# Patient Record
Sex: Male | Born: 1950
Health system: Southern US, Community
[De-identification: ages and names within clinical notes are randomized; demographics above are authoritative.]

## PROBLEM LIST (undated history)

## (undated) HISTORY — PX: OTHER SURGICAL HISTORY: SHX169

---

## 2011-04-14 ENCOUNTER — Encounter (HOSPITAL_COMMUNITY): Payer: Self-pay | Admitting: Anesthesiology

## 2011-04-14 ENCOUNTER — Emergency Department (HOSPITAL_COMMUNITY): Payer: BC Managed Care – PPO

## 2011-04-14 ENCOUNTER — Encounter (HOSPITAL_COMMUNITY): Admission: EM | Disposition: A | Discharge status: Rehabilitation Facility | Payer: Self-pay | Source: Ambulatory Visit

## 2011-04-14 ENCOUNTER — Emergency Department (HOSPITAL_COMMUNITY): Payer: BC Managed Care – PPO | Admitting: Anesthesiology

## 2011-04-14 ENCOUNTER — Inpatient Hospital Stay (HOSPITAL_COMMUNITY)
Admission: EM | Admit: 2011-04-14 | Discharge: 2011-04-27 | DRG: 755 | Disposition: A | Discharge status: Rehabilitation Facility | Payer: BC Managed Care – PPO | Source: Ambulatory Visit | Attending: General Surgery | Admitting: General Surgery

## 2011-04-14 ENCOUNTER — Encounter: Payer: Self-pay | Admitting: Emergency Medicine

## 2011-04-14 DIAGNOSIS — D62 Acute posthemorrhagic anemia: Secondary | ICD-10-CM | POA: Diagnosis not present

## 2011-04-14 DIAGNOSIS — S32009A Unspecified fracture of unspecified lumbar vertebra, initial encounter for closed fracture: Principal | ICD-10-CM | POA: Diagnosis present

## 2011-04-14 DIAGNOSIS — W11XXXA Fall on and from ladder, initial encounter: Secondary | ICD-10-CM | POA: Diagnosis present

## 2011-04-14 DIAGNOSIS — S82899A Other fracture of unspecified lower leg, initial encounter for closed fracture: Secondary | ICD-10-CM

## 2011-04-14 DIAGNOSIS — R209 Unspecified disturbances of skin sensation: Secondary | ICD-10-CM | POA: Diagnosis not present

## 2011-04-14 DIAGNOSIS — K5900 Constipation, unspecified: Secondary | ICD-10-CM | POA: Diagnosis not present

## 2011-04-14 DIAGNOSIS — R339 Retention of urine, unspecified: Secondary | ICD-10-CM | POA: Diagnosis not present

## 2011-04-14 DIAGNOSIS — S82309A Unspecified fracture of lower end of unspecified tibia, initial encounter for closed fracture: Secondary | ICD-10-CM | POA: Diagnosis present

## 2011-04-14 DIAGNOSIS — S61209A Unspecified open wound of unspecified finger without damage to nail, initial encounter: Secondary | ICD-10-CM

## 2011-04-14 DIAGNOSIS — S82402A Unspecified fracture of shaft of left fibula, initial encounter for closed fracture: Secondary | ICD-10-CM | POA: Diagnosis present

## 2011-04-14 DIAGNOSIS — IMO0001 Reserved for inherently not codable concepts without codable children: Secondary | ICD-10-CM | POA: Diagnosis present

## 2011-04-14 DIAGNOSIS — S32012A Unstable burst fracture of first lumbar vertebra, initial encounter for closed fracture: Secondary | ICD-10-CM | POA: Diagnosis present

## 2011-04-14 DIAGNOSIS — S61219A Laceration without foreign body of unspecified finger without damage to nail, initial encounter: Secondary | ICD-10-CM

## 2011-04-14 DIAGNOSIS — S82209A Unspecified fracture of shaft of unspecified tibia, initial encounter for closed fracture: Secondary | ICD-10-CM | POA: Diagnosis present

## 2011-04-14 DIAGNOSIS — R338 Other retention of urine: Secondary | ICD-10-CM | POA: Diagnosis not present

## 2011-04-14 DIAGNOSIS — S42409A Unspecified fracture of lower end of unspecified humerus, initial encounter for closed fracture: Secondary | ICD-10-CM | POA: Diagnosis present

## 2011-04-14 DIAGNOSIS — S82409A Unspecified fracture of shaft of unspecified fibula, initial encounter for closed fracture: Secondary | ICD-10-CM

## 2011-04-14 HISTORY — PX: I & D EXTREMITY: SHX5045

## 2011-04-14 HISTORY — PX: EXTERNAL FIXATION LEG: SHX1549

## 2011-04-14 LAB — COMPREHENSIVE METABOLIC PANEL
AST: 46 U/L — ABNORMAL HIGH (ref 0–37)
Albumin: 3.5 g/dL (ref 3.5–5.2)
BUN: 15 mg/dL (ref 6–23)
Chloride: 104 mEq/L (ref 96–112)
Creatinine, Ser: 1.12 mg/dL (ref 0.50–1.35)
Total Bilirubin: 0.9 mg/dL (ref 0.3–1.2)
Total Protein: 6.3 g/dL (ref 6.0–8.3)

## 2011-04-14 LAB — TYPE AND SCREEN
ABO/RH(D): O POS
Antibody Screen: NEGATIVE

## 2011-04-14 LAB — DIFFERENTIAL
Basophils Absolute: 0 10*3/uL (ref 0.0–0.1)
Basophils Relative: 0 % (ref 0–1)
Eosinophils Absolute: 0.1 10*3/uL (ref 0.0–0.7)
Monocytes Absolute: 0.7 10*3/uL (ref 0.1–1.0)
Monocytes Relative: 6 % (ref 3–12)
Neutro Abs: 10.1 10*3/uL — ABNORMAL HIGH (ref 1.7–7.7)
Neutrophils Relative %: 86 % — ABNORMAL HIGH (ref 43–77)

## 2011-04-14 LAB — CBC
Hemoglobin: 15.3 g/dL (ref 13.0–17.0)
MCH: 31.9 pg (ref 26.0–34.0)
MCHC: 34.6 g/dL (ref 30.0–36.0)
RDW: 13.3 % (ref 11.5–15.5)

## 2011-04-14 LAB — ABO/RH: ABO/RH(D): O POS

## 2011-04-14 SURGERY — EXTERNAL FIXATION, LOWER EXTREMITY
Anesthesia: General | Site: Leg Lower | Laterality: Left

## 2011-04-14 MED ORDER — ACETAMINOPHEN 325 MG PO TABS
650.0000 mg | ORAL_TABLET | ORAL | Status: DC | PRN
  Administered 2011-04-20 (×2): 650 mg via ORAL
  Filled 2011-04-14 (×2): qty 2

## 2011-04-14 MED ORDER — LACTATED RINGERS IV SOLN
INTRAVENOUS | Status: DC | PRN
  Administered 2011-04-14 (×5): via INTRAVENOUS

## 2011-04-14 MED ORDER — SODIUM CHLORIDE 0.9 % IJ SOLN
3.0000 mL | Freq: Two times a day (BID) | INTRAMUSCULAR | Status: DC
  Administered 2011-04-14: 3 mL via INTRAVENOUS

## 2011-04-14 MED ORDER — BUPIVACAINE HCL (PF) 0.5 % IJ SOLN
INTRAMUSCULAR | Status: DC | PRN
  Administered 2011-04-14: 5 mL

## 2011-04-14 MED ORDER — SODIUM CHLORIDE 0.9 % IR SOLN
Status: DC | PRN
  Administered 2011-04-14: 1000 mL

## 2011-04-14 MED ORDER — MENTHOL 3 MG MT LOZG
1.0000 | LOZENGE | OROMUCOSAL | Status: DC | PRN
  Filled 2011-04-14: qty 9

## 2011-04-14 MED ORDER — DIPHENHYDRAMINE HCL 12.5 MG/5ML PO ELIX
12.5000 mg | ORAL_SOLUTION | Freq: Four times a day (QID) | ORAL | Status: DC | PRN
  Filled 2011-04-14: qty 5

## 2011-04-14 MED ORDER — FENTANYL CITRATE 0.05 MG/ML IJ SOLN
INTRAMUSCULAR | Status: DC | PRN
  Administered 2011-04-14 (×2): 50 ug via INTRAVENOUS
  Administered 2011-04-14: 100 ug via INTRAVENOUS
  Administered 2011-04-14: 50 ug via INTRAVENOUS
  Administered 2011-04-14: 150 ug via INTRAVENOUS
  Administered 2011-04-14: 200 ug via INTRAVENOUS
  Administered 2011-04-14: 100 ug via INTRAVENOUS
  Administered 2011-04-14: 50 ug via INTRAVENOUS
  Administered 2011-04-14: 150 ug via INTRAVENOUS
  Administered 2011-04-14 (×3): 100 ug via INTRAVENOUS

## 2011-04-14 MED ORDER — DIPHENHYDRAMINE HCL 50 MG/ML IJ SOLN: 12.5000 mg | Freq: Four times a day (QID) | INTRAMUSCULAR | Status: DC | PRN

## 2011-04-14 MED ORDER — SODIUM CHLORIDE 0.9 % IV SOLN
INTRAVENOUS | Status: DC | PRN
  Administered 2011-04-14: 21:00:00 via INTRAVENOUS

## 2011-04-14 MED ORDER — MORPHINE SULFATE (PF) 1 MG/ML IV SOLN
INTRAVENOUS | Status: DC
  Administered 2011-04-15 (×2): via INTRAVENOUS
  Administered 2011-04-15: 4.27 mg via INTRAVENOUS
  Administered 2011-04-15: 18 mg via INTRAVENOUS
  Filled 2011-04-14 (×2): qty 25

## 2011-04-14 MED ORDER — HYDROMORPHONE HCL PF 1 MG/ML IJ SOLN
1.0000 mg | INTRAMUSCULAR | Status: DC | PRN
  Administered 2011-04-15 – 2011-04-16 (×4): 1 mg via INTRAVENOUS
  Administered 2011-04-16: 2 mg via INTRAVENOUS
  Administered 2011-04-16: 1 mg via INTRAVENOUS
  Administered 2011-04-16: 2 mg via INTRAVENOUS
  Administered 2011-04-16: 1 mg via INTRAVENOUS
  Administered 2011-04-16: 2 mg via INTRAVENOUS
  Administered 2011-04-17: 1 mg via INTRAVENOUS
  Administered 2011-04-17: 2 mg via INTRAVENOUS
  Administered 2011-04-17 (×4): 1 mg via INTRAVENOUS
  Administered 2011-04-17: 2 mg via INTRAVENOUS
  Administered 2011-04-18 – 2011-04-24 (×16): 1 mg via INTRAVENOUS
  Administered 2011-04-24 – 2011-04-25 (×3): 2 mg via INTRAVENOUS
  Administered 2011-04-26: 1 mg via INTRAVENOUS
  Administered 2011-04-26: 2 mg via INTRAVENOUS
  Filled 2011-04-14: qty 1
  Filled 2011-04-14: qty 2
  Filled 2011-04-14: qty 1
  Filled 2011-04-14: qty 2
  Filled 2011-04-14 (×2): qty 1
  Filled 2011-04-14 (×2): qty 2
  Filled 2011-04-14 (×3): qty 1
  Filled 2011-04-14: qty 2
  Filled 2011-04-14 (×2): qty 1
  Filled 2011-04-14 (×2): qty 2
  Filled 2011-04-14 (×6): qty 1
  Filled 2011-04-14: qty 2
  Filled 2011-04-14: qty 1
  Filled 2011-04-14: qty 2
  Filled 2011-04-14 (×2): qty 1
  Filled 2011-04-14 (×2): qty 2
  Filled 2011-04-14 (×2): qty 1
  Filled 2011-04-14 (×2): qty 2
  Filled 2011-04-14 (×3): qty 1
  Filled 2011-04-14: qty 2
  Filled 2011-04-14 (×2): qty 1

## 2011-04-14 MED ORDER — HYDROMORPHONE HCL PF 1 MG/ML IJ SOLN
INTRAMUSCULAR | Status: AC
  Administered 2011-04-14: 1 mg via INTRAVENOUS
  Filled 2011-04-14: qty 1

## 2011-04-14 MED ORDER — SODIUM CHLORIDE 0.9 % IR SOLN
Status: DC | PRN
  Administered 2011-04-14: 20:00:00

## 2011-04-14 MED ORDER — HYDROMORPHONE HCL PF 1 MG/ML IJ SOLN: 1.0000 mg | Freq: Once | INTRAMUSCULAR | Status: DC

## 2011-04-14 MED ORDER — MEPERIDINE HCL 25 MG/ML IJ SOLN: 6.2500 mg | INTRAMUSCULAR | Status: DC | PRN

## 2011-04-14 MED ORDER — NEOSTIGMINE METHYLSULFATE 1 MG/ML IJ SOLN
INTRAMUSCULAR | Status: DC | PRN
  Administered 2011-04-14: 4.5 mg via INTRAVENOUS

## 2011-04-14 MED ORDER — ONDANSETRON HCL 4 MG/2ML IJ SOLN: 4.0000 mg | Freq: Once | INTRAMUSCULAR | Status: DC | PRN

## 2011-04-14 MED ORDER — POTASSIUM CHLORIDE IN NACL 20-0.9 MEQ/L-% IV SOLN
INTRAVENOUS | Status: DC
  Administered 2011-04-14: via INTRAVENOUS
  Filled 2011-04-14 (×3): qty 1000

## 2011-04-14 MED ORDER — CEFAZOLIN SODIUM 1-5 GM-% IV SOLN
INTRAVENOUS | Status: DC | PRN
  Administered 2011-04-14: 2 g via INTRAVENOUS

## 2011-04-14 MED ORDER — HYDROMORPHONE HCL PF 1 MG/ML IJ SOLN
0.2500 mg | INTRAMUSCULAR | Status: DC | PRN
  Administered 2011-04-14: 0.5 mg via INTRAVENOUS
  Administered 2011-04-14: 1 mg via INTRAVENOUS
  Administered 2011-04-14: 0.5 mg via INTRAVENOUS

## 2011-04-14 MED ORDER — TETANUS-DIPHTH-ACELL PERTUSSIS 5-2.5-18.5 LF-MCG/0.5 IM SUSP
INTRAMUSCULAR | Status: AC
  Administered 2011-04-14: 0.5 mL via INTRAMUSCULAR
  Filled 2011-04-14: qty 0.5

## 2011-04-14 MED ORDER — NALOXONE HCL 0.4 MG/ML IJ SOLN
0.4000 mg | INTRAMUSCULAR | Status: DC | PRN
  Filled 2011-04-14: qty 1

## 2011-04-14 MED ORDER — ONDANSETRON HCL 4 MG PO TABS: 4.0000 mg | ORAL_TABLET | Freq: Four times a day (QID) | ORAL | Status: DC | PRN

## 2011-04-14 MED ORDER — ALUM & MAG HYDROXIDE-SIMETH 400-400-40 MG/5ML PO SUSP
30.0000 mL | Freq: Four times a day (QID) | ORAL | Status: DC | PRN
  Filled 2011-04-14: qty 30

## 2011-04-14 MED ORDER — ONDANSETRON HCL 4 MG/2ML IJ SOLN
4.0000 mg | INTRAMUSCULAR | Status: DC | PRN
  Administered 2011-04-15: 4 mg via INTRAVENOUS

## 2011-04-14 MED ORDER — ROCURONIUM BROMIDE 100 MG/10ML IV SOLN
INTRAVENOUS | Status: DC | PRN
  Administered 2011-04-14: 50 mg via INTRAVENOUS
  Administered 2011-04-14: 10 mg via INTRAVENOUS
  Administered 2011-04-14: 20 mg via INTRAVENOUS
  Administered 2011-04-14 (×3): 10 mg via INTRAVENOUS
  Administered 2011-04-14 (×2): 20 mg via INTRAVENOUS
  Administered 2011-04-14: 10 mg via INTRAVENOUS

## 2011-04-14 MED ORDER — MIDAZOLAM HCL 5 MG/5ML IJ SOLN
INTRAMUSCULAR | Status: DC | PRN
  Administered 2011-04-14: 2 mg via INTRAVENOUS

## 2011-04-14 MED ORDER — PROPOFOL 10 MG/ML IV EMUL
INTRAVENOUS | Status: DC | PRN
  Administered 2011-04-14: 150 mg via INTRAVENOUS

## 2011-04-14 MED ORDER — TETANUS-DIPHTH-ACELL PERTUSSIS 5-2.5-18.5 LF-MCG/0.5 IM SUSP: 0.5000 mL | Freq: Once | INTRAMUSCULAR | Status: DC

## 2011-04-14 MED ORDER — SUCCINYLCHOLINE CHLORIDE 20 MG/ML IJ SOLN
INTRAMUSCULAR | Status: DC | PRN
  Administered 2011-04-14: 100 mg via INTRAVENOUS

## 2011-04-14 MED ORDER — SODIUM CHLORIDE 0.9 % IJ SOLN: 3.0000 mL | INTRAMUSCULAR | Status: DC | PRN

## 2011-04-14 MED ORDER — LIDOCAINE-EPINEPHRINE 1 %-1:100000 IJ SOLN
INTRAMUSCULAR | Status: DC | PRN
  Administered 2011-04-14: 5 mL

## 2011-04-14 MED ORDER — HYDROMORPHONE HCL PF 1 MG/ML IJ SOLN
1.0000 mg | Freq: Once | INTRAMUSCULAR | Status: AC
  Administered 2011-04-14: 1 mg via INTRAVENOUS
  Filled 2011-04-14: qty 1

## 2011-04-14 MED ORDER — ACETAMINOPHEN 650 MG RE SUPP: 650.0000 mg | RECTAL | Status: DC | PRN

## 2011-04-14 MED ORDER — ONDANSETRON HCL 4 MG/2ML IJ SOLN
4.0000 mg | Freq: Four times a day (QID) | INTRAMUSCULAR | Status: DC | PRN
  Filled 2011-04-14: qty 2

## 2011-04-14 MED ORDER — SODIUM CHLORIDE 0.9 % IV SOLN: 250.0000 mL | INTRAVENOUS | Status: DC

## 2011-04-14 MED ORDER — GLYCOPYRROLATE 0.2 MG/ML IJ SOLN
INTRAMUSCULAR | Status: DC | PRN
  Administered 2011-04-14: .8 mg via INTRAVENOUS

## 2011-04-14 MED ORDER — CEFAZOLIN SODIUM 1-5 GM-% IV SOLN
1.0000 g | Freq: Three times a day (TID) | INTRAVENOUS | Status: AC
  Administered 2011-04-15 (×2): 1 g via INTRAVENOUS
  Filled 2011-04-14 (×2): qty 50

## 2011-04-14 MED ORDER — ONDANSETRON HCL 4 MG/2ML IJ SOLN
INTRAMUSCULAR | Status: DC | PRN
  Administered 2011-04-14: 4 mg via INTRAVENOUS

## 2011-04-14 MED ORDER — DOCUSATE SODIUM 100 MG PO CAPS
100.0000 mg | ORAL_CAPSULE | Freq: Two times a day (BID) | ORAL | Status: DC
  Administered 2011-04-15 – 2011-04-27 (×25): 100 mg via ORAL
  Filled 2011-04-14 (×23): qty 1

## 2011-04-14 MED ORDER — PHENOL 1.4 % MT LIQD: 1.0000 | OROMUCOSAL | Status: DC | PRN

## 2011-04-14 MED ORDER — PANTOPRAZOLE SODIUM 40 MG IV SOLR
40.0000 mg | Freq: Every day | INTRAVENOUS | Status: DC
  Administered 2011-04-15: 40 mg via INTRAVENOUS
  Filled 2011-04-14 (×11): qty 40

## 2011-04-14 MED ORDER — THROMBIN 20000 UNITS EX KIT
PACK | CUTANEOUS | Status: DC | PRN
  Administered 2011-04-14: 20:00:00 via TOPICAL

## 2011-04-14 MED ORDER — PANTOPRAZOLE SODIUM 40 MG PO TBEC
40.0000 mg | DELAYED_RELEASE_TABLET | Freq: Every day | ORAL | Status: DC
Start: 2011-04-14 — End: 2011-04-27
  Administered 2011-04-15 – 2011-04-27 (×13): 40 mg via ORAL
  Filled 2011-04-14 (×12): qty 1

## 2011-04-14 MED ORDER — ONDANSETRON HCL 4 MG/2ML IJ SOLN: 4.0000 mg | Freq: Four times a day (QID) | INTRAMUSCULAR | Status: DC | PRN

## 2011-04-14 MED ORDER — SODIUM CHLORIDE 0.9 % IJ SOLN: 9.0000 mL | INTRAMUSCULAR | Status: DC | PRN

## 2011-04-14 MED ORDER — KCL-LACTATED RINGERS-D5W 20 MEQ/L IV SOLN
INTRAVENOUS | Status: DC
  Administered 2011-04-15 – 2011-04-19 (×5): via INTRAVENOUS
  Filled 2011-04-14 (×8): qty 1000

## 2011-04-14 SURGICAL SUPPLY — 98 items
3.2MM DRILL BIT ×1 IMPLANT
ADH SKN CLS APL DERMABOND .7 (GAUZE/BANDAGES/DRESSINGS) ×3
BAG DECANTER FOR FLEXI CONT (MISCELLANEOUS) ×1 IMPLANT
BANDAGE COBAN STERILE 4 (GAUZE/BANDAGES/DRESSINGS) ×1 IMPLANT
BANDAGE ELASTIC 6 VELCRO ST LF (GAUZE/BANDAGES/DRESSINGS) ×1 IMPLANT
BLADE SURG 10 STRL SS (BLADE) ×3 IMPLANT
BLADE SURG 15 STRL LF DISP TIS (BLADE) IMPLANT
BLADE SURG 15 STRL SS (BLADE) ×4
BNDG COHESIVE 4X5 TAN STRL (GAUZE/BANDAGES/DRESSINGS) ×3 IMPLANT
BNDG COHESIVE 6X5 TAN STRL LF (GAUZE/BANDAGES/DRESSINGS) ×3 IMPLANT
CHLORAPREP W/TINT 26ML (MISCELLANEOUS) ×4 IMPLANT
CLAMP 2 3/5OST (Clamp) ×2 IMPLANT
CLAMP 5 HOLE (Clamp) ×1 IMPLANT
CLAMP PIN-ROD (Clamp) ×2 IMPLANT
CLAMP ROD-ROD (Clamp) ×2 IMPLANT
CLOTH BEACON ORANGE TIMEOUT ST (SAFETY) ×5 IMPLANT
CONT SPEC STER OR (MISCELLANEOUS) ×2 IMPLANT
CORDS BIPOLAR (ELECTRODE) ×1 IMPLANT
COVER SURGICAL LIGHT HANDLE (MISCELLANEOUS) ×5 IMPLANT
CROSSOVER SMALL (Orthopedic Implant) ×1 IMPLANT
CUFF TOURNIQUET SINGLE 34IN LL (TOURNIQUET CUFF) ×4 IMPLANT
CUFF TOURNIQUET SINGLE 44IN (TOURNIQUET CUFF) IMPLANT
DERMABOND ADVANCED (GAUZE/BANDAGES/DRESSINGS) ×1
DERMABOND ADVANCED .7 DNX12 (GAUZE/BANDAGES/DRESSINGS) IMPLANT
DRAPE C-ARM 42X72 X-RAY (DRAPES) ×4 IMPLANT
DRAPE EXTREMITY T 121X128X90 (DRAPE) ×1 IMPLANT
DRAPE LAPAROTOMY T 102X78X121 (DRAPES) ×1 IMPLANT
DRAPE PROXIMA HALF (DRAPES) ×2 IMPLANT
DRAPE U-SHAPE 47X51 STRL (DRAPES) ×4 IMPLANT
DRSG ADAPTIC 3X8 NADH LF (GAUZE/BANDAGES/DRESSINGS) IMPLANT
DRSG PAD ABDOMINAL 8X10 ST (GAUZE/BANDAGES/DRESSINGS) IMPLANT
DURAPREP 26ML APPLICATOR (WOUND CARE) ×1 IMPLANT
ELECT REM PT RETURN 9FT ADLT (ELECTROSURGICAL) ×4
ELECTRODE REM PT RTRN 9FT ADLT (ELECTROSURGICAL) ×3 IMPLANT
GAUZE KERLIX 2  STERILE LF (GAUZE/BANDAGES/DRESSINGS) ×1 IMPLANT
GAUZE SPONGE 4X4 12PLY STRL LF (GAUZE/BANDAGES/DRESSINGS) ×1 IMPLANT
GAUZE XEROFORM 5X9 LF (GAUZE/BANDAGES/DRESSINGS) ×1 IMPLANT
GLOVE BIO SURGEON STRL SZ 6.5 (GLOVE) ×2 IMPLANT
GLOVE BIO SURGEON STRL SZ8 (GLOVE) ×5 IMPLANT
GLOVE BIOGEL PI IND STRL 6.5 (GLOVE) IMPLANT
GLOVE BIOGEL PI IND STRL 7.0 (GLOVE) IMPLANT
GLOVE BIOGEL PI IND STRL 8 (GLOVE) ×3 IMPLANT
GLOVE BIOGEL PI IND STRL 8.5 (GLOVE) IMPLANT
GLOVE BIOGEL PI INDICATOR 6.5 (GLOVE) ×1
GLOVE BIOGEL PI INDICATOR 7.0 (GLOVE) ×1
GLOVE BIOGEL PI INDICATOR 8 (GLOVE) ×2
GLOVE BIOGEL PI INDICATOR 8.5 (GLOVE) ×2
GLOVE ECLIPSE 7.0 STRL STRAW (GLOVE) ×1 IMPLANT
GLOVE ECLIPSE 8.5 STRL (GLOVE) ×2 IMPLANT
GOWN BRE IMP SLV AUR LG STRL (GOWN DISPOSABLE) ×1 IMPLANT
GOWN PREVENTION PLUS XLARGE (GOWN DISPOSABLE) ×5 IMPLANT
GOWN STRL NON-REIN LRG LVL3 (GOWN DISPOSABLE) ×9 IMPLANT
GOWN STRL REIN 2XL LVL4 (GOWN DISPOSABLE) ×2 IMPLANT
KIT BASIN OR (CUSTOM PROCEDURE TRAY) ×5 IMPLANT
KIT ROOM TURNOVER OR (KITS) ×4 IMPLANT
MANIFOLD NEPTUNE II (INSTRUMENTS) ×4 IMPLANT
NDL HYPO 25GX1X1/2 BEV (NEEDLE) IMPLANT
NEEDLE 22X1 1/2 (OR ONLY) (NEEDLE) IMPLANT
NEEDLE HYPO 25GX1X1/2 BEV (NEEDLE) ×4 IMPLANT
NS IRRIG 1000ML POUR BTL (IV SOLUTION) ×5 IMPLANT
PACK FOAM VITOSS 10CC (Orthopedic Implant) ×1 IMPLANT
PACK LAMINECTOMY NEURO (CUSTOM PROCEDURE TRAY) ×1 IMPLANT
PACK ORTHO EXTREMITY (CUSTOM PROCEDURE TRAY) ×4 IMPLANT
PAD ARMBOARD 7.5X6 YLW CONV (MISCELLANEOUS) ×6 IMPLANT
PAD CAST 4YDX4 CTTN HI CHSV (CAST SUPPLIES) ×3 IMPLANT
PADDING CAST COTTON 4X4 STRL (CAST SUPPLIES) ×4
PADDING WEBRIL 4 STERILE (GAUZE/BANDAGES/DRESSINGS) ×2 IMPLANT
PATTIES SURGICAL .5 X1 (DISPOSABLE) ×1 IMPLANT
PATTIES SURGICAL 1X1 (DISPOSABLE) ×1 IMPLANT
ROD 70MM (Rod) ×4 IMPLANT
ROD 80MM (Rod) ×1 IMPLANT
ROD CARBON FIBER 9.5X350MM (Rod) ×2 IMPLANT
ROD SPNL 70X5.5XPRECONTOUR (Rod) IMPLANT
SCREW 55MM (Screw) ×2 IMPLANT
SCREW BONE 5MMX180MM (Screw) ×2 IMPLANT
SCREW TRANSFIXING 5MMX350MM (Screw) ×1 IMPLANT
SOAP 2 % CHG 4 OZ (WOUND CARE) ×4 IMPLANT
SPONGE GAUZE 4X4 12PLY (GAUZE/BANDAGES/DRESSINGS) IMPLANT
SPONGE SURGIFOAM ABS GEL 100 (HEMOSTASIS) ×2 IMPLANT
STAPLER VISISTAT 35W (STAPLE) IMPLANT
SUCTION FRAZIER TIP 10 FR DISP (SUCTIONS) IMPLANT
SUT ETHILON 2 0 PSLX (SUTURE) ×2 IMPLANT
SUT ETHILON 4 0 PS 2 18 (SUTURE) ×1 IMPLANT
SUT PROLENE 3 0 PS 2 (SUTURE) ×4 IMPLANT
SUT VIC AB 1 CT1 18XBRD ANBCTR (SUTURE) IMPLANT
SUT VIC AB 1 CT1 8-18 (SUTURE) ×4
SUT VIC AB 2-0 CP2 18 (SUTURE) ×2 IMPLANT
SUT VIC AB 3-0 PS2 18 (SUTURE) ×4
SUT VIC AB 3-0 PS2 18XBRD (SUTURE) ×3 IMPLANT
SUT VIC AB 3-0 SH 8-18 (SUTURE) ×2 IMPLANT
SYR 20CC LL (SYRINGE) ×1 IMPLANT
SYR CONTROL 10ML LL (SYRINGE) IMPLANT
TOWEL OR 17X24 6PK STRL BLUE (TOWEL DISPOSABLE) ×4 IMPLANT
TOWEL OR 17X26 10 PK STRL BLUE (TOWEL DISPOSABLE) ×4 IMPLANT
TUBE CONNECTING 12X1/4 (SUCTIONS) IMPLANT
WATER STERILE IRR 1000ML POUR (IV SOLUTION) ×5 IMPLANT
ZODIAC 5.5 X 40MM SCREWS ×2 IMPLANT
ZODIAC SET SCREWS ×4 IMPLANT

## 2011-04-14 NOTE — ED Provider Notes (Signed)
History     CSN: [id number] Arrival date & time: 04/14/2011 10:32 AM   First MD Initiated Contact with Patient 04/14/11 1038      Chief Complaint  Patient presents with  . Fall    (Consider location/radiation/quality/duration/timing/severity/associated sxs/prior treatment) HPI  History reviewed. No pertinent past medical history. Norman Pearson is a 60 year old male who fell from a ladder approximately 12 feet. He landed on his left foot. He has severe pain in his left foot in his low back area. He did not strike his head and has no loss of consciousness. Patient was transported via EMS on a long spine board. The pain is severe. He did receive prehospital morphine 4 mg. He denies any numbness or tingling or weakness. Past Surgical History  Procedure Date  . Other surgical history     tendon surgery    No family history on file.  History  Substance Use Topics  . Smoking status: Never Smoker   . Smokeless tobacco: Not on file  . Alcohol Use: 1.5 oz/week    3 drink(s) per week      Review of Systems  All other systems reviewed and are negative.    Allergies  Review of patient's allergies indicates no known allergies.  Home Medications   Current Outpatient Rx  Name Route Sig Dispense Refill  . ACETAMINOPHEN 325 MG PO TABS Oral Take 650 mg by mouth every 6 (six) hours as needed. Pain/fever     . ASPIRIN BUFFERED 325 MG PO TABS Oral Take 650 mg by mouth daily as needed. pain     . MILK THISTLE PO Oral Take 1 tablet by mouth daily.        BP 129/82  Pulse 76  Temp(Src) 98.1 F (36.7 C) (Oral)  Resp 16  Ht 6' (1.829 m)  Wt 162 lb (73.483 kg)  BMI 21.97 kg/m2  SpO2 100%  Physical Exam  Nursing note and vitals reviewed. Constitutional: He is oriented to person, place, and time. He appears well-developed and well-nourished.  HENT:  Head: Normocephalic and atraumatic.  Right Ear: External ear normal.  Left Ear: External ear normal.  Nose: Nose normal.    Mouth/Throat: Oropharynx is clear and moist.  Eyes: Conjunctivae and EOM are normal. Pupils are equal, round, and reactive to light.  Neck: Normal range of motion. Neck supple.  Cardiovascular: Normal rate, regular rhythm, normal heart sounds and intact distal pulses.   Pulmonary/Chest: Effort normal and breath sounds normal.  Abdominal: Soft. Bowel sounds are normal.  Musculoskeletal:       Deformity with swelling and tenderness left ankle. Also tender proximal left lower leg. Laceration of the left fourth digit of the left hand noted.  Neurological: He is alert and oriented to person, place, and time. He has normal strength and normal reflexes. No sensory deficit. GCS eye subscore is 4. GCS verbal subscore is 5. GCS motor subscore is 6.       Patient had equal sensation throughout. I was unable to test the strength of the left lower extremity due to the fracture of the left ankle. Toes moved equally bilaterally.    ED Course  Procedures (including critical care time)  Labs Reviewed  CBC - Abnormal; Notable for the following:    WBC 11.8 (*)    All other components within normal limits  DIFFERENTIAL - Abnormal; Notable for the following:    Neutrophils Relative 86 (*)    Neutro Abs 10.1 (*)    Lymphocytes Relative  8 (*)    All other components within normal limits  COMPREHENSIVE METABOLIC PANEL - Abnormal; Notable for the following:    Glucose, Bld 132 (*)    Calcium 8.3 (*)    AST 46 (*)    GFR calc non Af Amer 69 (*)    GFR calc Af Amer 80 (*)    All other components within normal limits  TYPE AND SCREEN  ABO/RH   Dg Ankle 2 Views Left  04/14/2011  *RADIOLOGY REPORT*  Clinical Data: Larey Seat from 12 feet height.  Severe ankle pain.  LEFT ANKLE - 2 VIEW  Comparison: None.  Findings: Highly comminuted fracture of the distal tibial metaphysis is seen with intra-articular extension.  No definite the distal fibular fracture identified on this two-view exam.  The talus remains centered  within the ankle mortise.  IMPRESSION: Highly comminuted fracture of the distal tibial metaphysis with intra-articular extension.  Original Report Authenticated By: Danae Orleans, M.D.   Dg Pelvis Portable  04/14/2011  *RADIOLOGY REPORT*  Clinical Data: Pain after fall from ladder.  PORTABLE PELVIS  Comparison: None.  Findings: Both femoral heads are located. Sacroiliac joints are symmetric.  No acute fracture identified.  IMPRESSION: No acute findings.  Original Report Authenticated By: Consuello Bossier, M.D.   Dg Chest Port 1 View  04/14/2011  *RADIOLOGY REPORT*  Clinical Data: Pain after fall from ladder.  PORTABLE CHEST - 1 VIEW  Comparison: None.  Findings: 2 frontal views. Midline trachea.  Normal heart size.  No pleural effusion or pneumothorax.  There is biapical pleural parenchymal scarring.  Lungs otherwise clear. No free intraperitoneal air.  IMPRESSION: No acute or post-traumatic deformity within the chest.  Original Report Authenticated By: Consuello Bossier, M.D.   Dg Knee Complete 4 Views Left  04/14/2011  *RADIOLOGY REPORT*  Clinical Data: Fall from ladder with left lower extremity injuries.  LEFT KNEE - COMPLETE 4+ VIEW  Comparison: Ankle film earlier today.  Findings: There is high fibular fracture with mildly displaced and oblique diaphyseal fracture present.  Alignment of the knee is normal.  No joint effusion.  IMPRESSION: Mildly displaced proximal fibular fracture.  Original Report Authenticated By: Reola Calkins, M.D.     No diagnosis found.    MDM  Patient with comminuted left ankle fracture and with proximal fibular fracture on x-Teandra Harlan. Head CT neck CT did not show any evidence of fracture. Chest x-Ajeenah Heiny is clear. Pelvis x-Danna Casella does not show evidence of fracture. Lumbar spine CT shows burst fracture of L1.  Patient's patient's orthopedist is Dr. Darrelyn Hillock he is consult visit will be in to see the patient for the ankle fracture. Trauma has been consult said. Dr. Andrey Campanile will be  in to see the patient. Dr. Jerad Dunlap Dess has been paged neurosurgery     CRITICAL CARE Performed by: Melainie Krinsky S   Total critical care time:95  Critical care time was exclusive of separately billable procedures and treating other patients.  Critical care was necessary to treat or prevent imminent or life-threatening deterioration.  Critical care was time spent personally by me on the following activities: development of treatment plan with patient and/or surrogate as well as nursing, discussions with consultants, evaluation of patient's response to treatment, examination of patient, obtaining history from patient or surrogate, ordering and performing treatments and interventions, ordering and review of laboratory studies, ordering and review of radiographic studies, pulse oximetry and re-evaluation of patient's condition. a  Hilario Quarry, MD 04/14/11 [phone]

## 2011-04-14 NOTE — ED Notes (Signed)
Dr Danielle Dess is seeing the pt.  Pt  Alert oriented skin warm and dry.  Lt hand bandaged.  Lt lower leg in a splint waiting for or to call for the pt

## 2011-04-14 NOTE — ED Notes (Signed)
Or has called they are ready for the pt now

## 2011-04-14 NOTE — Interval H&P Note (Signed)
History and Physical Interval Note:   04/14/2011   5:04 PM   Norman Pearson  has presented today for surgery, with the diagnosis of tibial-pilon fracture and left hand laceration.  The various methods of treatment have been discussed with the patient and family. After consideration of risks, benefits and other options for treatment, the patient has consented to  Procedure(s):  Closed reduction and application of external fixator to the left lower extremity.  I and D and closure of left hand laceration. as a surgical intervention .  The patients' history has been reviewed, patient examined, no change in status, stable for surgery.  I have reviewed the patients' chart and labs.  Questions were answered to the patient's satisfaction.     [name]  MD

## 2011-04-14 NOTE — ED Notes (Signed)
Portable chest and pelvis xray at bedside

## 2011-04-14 NOTE — Consult Note (Signed)
Reason for Consult: L1 burst fracture  Referring Physician: Daniel Ray M.D.  Norman Pearson is an 60 y.o. male.  HPI: Patient is a 60-year-old man who had a 12-15 foot fall from a ladder today. Landed on his outstretched left lower extremity he collapsed to the ground. He has a comminuted fracture of the left lower extremity at the ankle and involving both the tibia and the fibula. He also sustained a burst fracture of the L1 vertebrae with approximately 50% bone retropulsed into the spinal canal. He is being admitted to the trauma service for observation and treatment and Dr. Hewitt is planning on placing an external fixator on his left lower extremity. The patient has moderate discomfort because of back pain.  History reviewed. No pertinent past medical history.  Past Surgical History   Procedure  Date   .  Other surgical history      tendon surgery    No family history on file.  Social History: reports that he quit smoking about 37 years ago. He does not have any smokeless tobacco history on file. He reports that he drinks about 1.5 ounces of alcohol per week. He reports that he does not use illicit drugs.  Allergies: No Known Allergies  Medications: I have reviewed the patient's current medications.  Results for orders placed during the hospital encounter of 04/14/11 (from the past 48 hour(s))   CBC Status: Abnormal    Collection Time    04/14/11 10:41 AM   Component  Value  Range  Comment    WBC  11.8 (*)  4.0 - 10.5 (K/uL)     RBC  4.79  4.22 - 5.81 (MIL/uL)     Hemoglobin  15.3  13.0 - 17.0 (g/dL)     HCT  44.2  39.0 - 52.0 (%)     MCV  92.3  78.0 - 100.0 (fL)     MCH  31.9  26.0 - 34.0 (pg)     MCHC  34.6  30.0 - 36.0 (g/dL)     RDW  13.3  11.5 - 15.5 (%)     Platelets  216  150 - 400 (K/uL)    DIFFERENTIAL Status: Abnormal    Collection Time    04/14/11 10:41 AM   Component  Value  Range  Comment    Neutrophils Relative  86 (*)  43 - 77 (%)     Neutro Abs  10.1 (*)  1.7  - 7.7 (K/uL)     Lymphocytes Relative  8 (*)  12 - 46 (%)     Lymphs Abs  0.9  0.7 - 4.0 (K/uL)     Monocytes Relative  6  3 - 12 (%)     Monocytes Absolute  0.7  0.1 - 1.0 (K/uL)     Eosinophils Relative  1  0 - 5 (%)     Eosinophils Absolute  0.1  0.0 - 0.7 (K/uL)     Basophils Relative  0  0 - 1 (%)     Basophils Absolute  0.0  0.0 - 0.1 (K/uL)    COMPREHENSIVE METABOLIC PANEL Status: Abnormal    Collection Time    04/14/11 10:41 AM   Component  Value  Range  Comment    Sodium  137  135 - 145 (mEq/L)     Potassium  3.8  3.5 - 5.1 (mEq/L)     Chloride  104  96 - 112 (mEq/L)     CO2  24    19 - 32 (mEq/L)     Glucose, Bld  132 (*)  70 - 99 (mg/dL)     BUN  15  6 - 23 (mg/dL)     Creatinine, Ser  1.12  0.50 - 1.35 (mg/dL)     Calcium  8.3 (*)  8.4 - 10.5 (mg/dL)     Total Protein  6.3  6.0 - 8.3 (g/dL)     Albumin  3.5  3.5 - 5.2 (g/dL)     AST  46 (*)  0 - 37 (U/L)     ALT  37  0 - 53 (U/L)     Alkaline Phosphatase  56  39 - 117 (U/L)     Total Bilirubin  0.9  0.3 - 1.2 (mg/dL)     GFR calc non Af Amer  69 (*)  >90 (mL/min)     GFR calc Af Amer  80 (*)  >90 (mL/min)    TYPE AND SCREEN Status: Normal    Collection Time    04/14/11 10:45 AM   Component  Value  Range  Comment    ABO/RH(D)  O POS      Antibody Screen  NEG      Sample Expiration  04/17/2011     ABO/RH Status: Normal    Collection Time    04/14/11 10:45 AM   Component  Value  Range  Comment    ABO/RH(D)  O POS      Dg Ankle 2 Views Left  04/14/2011 *RADIOLOGY REPORT* Clinical Data: Fell from 12 feet height. Severe ankle pain. LEFT ANKLE - 2 VIEW Comparison: None. Findings: Highly comminuted fracture of the distal tibial metaphysis is seen with intra-articular extension. No definite the distal fibular fracture identified on this two-view exam. The talus remains centered within the ankle mortise. IMPRESSION: Highly comminuted fracture of the distal tibial metaphysis with intra-articular extension. Original Report  Authenticated By: JOHN A. STAHL, M.D.  Ct Head Wo Contrast  04/14/2011 *RADIOLOGY REPORT* Clinical Data: Fall CT HEAD WITHOUT CONTRAST CT CERVICAL SPINE WITHOUT CONTRAST Technique: Multidetector CT imaging of the head and cervical spine was performed following the standard protocol without intravenous contrast. Multiplanar CT image reconstructions of the cervical spine were also generated. Comparison: Site of Service CT HEAD Findings: The ventricles are normal in size, shape, and position. There is no mass effect or midline shift. No acute hemorrhage or abnormal extra-axial fluid collections are identified. The gray/white differentiation is normal. There is no evidence of skull fracture. The orbits have a normal appearance. The visualized paranasal sinuses are clear well-aerated except for small mucous retention cyst in the left maxillary sinus. IMPRESSION: There is no evidence of acute intracranial abnormality. CT CERVICAL SPINE Findings: The odontoid is intact and the lateral masses are well- aligned. The AP and lateral cervical alignment are normal. The prevertebral soft tissue stripe is within normal limits. There is no evidence of cervical spine fracture or dislocation. A central disc herniation is noted posteriorly at C3-C4. IMPRESSION: There is a central disc herniation at C3-C4. There is no evidence of cervical spine fracture or dislocation. Original Report Authenticated By: CARON B. DOVER, M.D.  Ct Cervical Spine Wo Contrast  04/14/2011 *RADIOLOGY REPORT* Clinical Data: Fall CT HEAD WITHOUT CONTRAST CT CERVICAL SPINE WITHOUT CONTRAST Technique: Multidetector CT imaging of the head and cervical spine was performed following the standard protocol without intravenous contrast. Multiplanar CT image reconstructions of the cervical spine were also generated. Comparison: Site of Service CT HEAD Findings:   The ventricles are normal in size, shape, and position. There is no mass effect or midline shift. No acute  hemorrhage or abnormal extra-axial fluid collections are identified. The gray/white differentiation is normal. There is no evidence of skull fracture. The orbits have a normal appearance. The visualized paranasal sinuses are clear well-aerated except for small mucous retention cyst in the left maxillary sinus. IMPRESSION: There is no evidence of acute intracranial abnormality. CT CERVICAL SPINE Findings: The odontoid is intact and the lateral masses are well- aligned. The AP and lateral cervical alignment are normal. The prevertebral soft tissue stripe is within normal limits. There is no evidence of cervical spine fracture or dislocation. A central disc herniation is noted posteriorly at C3-C4. IMPRESSION: There is a central disc herniation at C3-C4. There is no evidence of cervical spine fracture or dislocation. Original Report Authenticated By: CARON B. DOVER, M.D.  Ct Lumbar Spine Wo Contrast  04/14/2011 *RADIOLOGY REPORT* Clinical Data: Status post fall. Back pain. CT LUMBAR SPINE WITHOUT CONTRAST Technique: Multidetector CT imaging of the lumbar spine was performed without intravenous contrast administration. Multiplanar CT image reconstructions were also generated. Comparison: None. Findings: The patient has a comminuted superior endplate compression fracture of L1. Vertebral body height loss is estimated at 30%. There is a Schmorl's node in the inferior endplate of L1 which is incidentally noted. A bony fragment measuring approximately 0.9 cm AP by 1.6 cm transverse by 1.7 cm cranial- caudal centrally and eccentric to the left is retropulsed into the canal. The fragment results in moderate to moderately severe central canal stenosis and marked narrowing of the left lateral recess. The patient's fracture does not extend to the posterior elements. No other fracture is identified with vertebral body height otherwise maintained. There is some disc bulging that is L3-4 and L4-5 causing mild central canal  narrowing. Foramina appear open. IMPRESSION: Superior endplate compression fracture deformity of L1 with bony retropulsion into the central canal causing moderate to moderately severe central canal narrowing and marked narrowing of the left lateral recess. Original Report Authenticated By: THOMAS L. D'ALESSIO, M.D.  Dg Pelvis Portable  04/14/2011 *RADIOLOGY REPORT* Clinical Data: Pain after fall from ladder. PORTABLE PELVIS Comparison: None. Findings: Both femoral heads are located. Sacroiliac joints are symmetric. No acute fracture identified. IMPRESSION: No acute findings. Original Report Authenticated By: KYLE D. TALBOT, M.D.  Dg Chest Port 1 View  04/14/2011 *RADIOLOGY REPORT* Clinical Data: Pain after fall from ladder. PORTABLE CHEST - 1 VIEW Comparison: None. Findings: 2 frontal views. Midline trachea. Normal heart size. No pleural effusion or pneumothorax. There is biapical pleural parenchymal scarring. Lungs otherwise clear. No free intraperitoneal air. IMPRESSION: No acute or post-traumatic deformity within the chest. Original Report Authenticated By: KYLE D. TALBOT, M.D.  Dg Knee Complete 4 Views Left  04/14/2011 *RADIOLOGY REPORT* Clinical Data: Fall from ladder with left lower extremity injuries. LEFT KNEE - COMPLETE 4+ VIEW Comparison: Ankle film earlier today. Findings: There is high fibular fracture with mildly displaced and oblique diaphyseal fracture present. Alignment of the knee is normal. No joint effusion. IMPRESSION: Mildly displaced proximal fibular fracture. Original Report Authenticated By: GLENN T. YAMAGATA, M.D.  Dg Hand Complete Left  04/14/2011 *RADIOLOGY REPORT* Clinical Data: Fall. Laceration. LEFT HAND - COMPLETE 3+ VIEW Comparison: None. Findings: Portable examination without evidence of fracture or dislocation. If there is persistent finger discomfort, cone down view of the specific fingers can be obtained for further delineation with finger spread apart on the lateral view.  IMPRESSION: No fracture   or dislocation. See above. Original Report Authenticated By: STEVEN R. OLSON, M.D.   Review of Systems  Constitutional: Negative.  HENT: Negative.  Eyes: Negative.  Respiratory: Negative.  Cardiovascular: Negative.  Gastrointestinal: Negative.  Genitourinary: Negative.  Musculoskeletal: Positive for back pain and falls.  Comminuted fracture of left leg and ankle  Skin: Negative.  Neurological: Negative.  Endo/Heme/Allergies: Negative.  Psychiatric/Behavioral: Negative.   Blood pressure 130/85, pulse 91, temperature 98.1 F (36.7 C), temperature source Oral, resp. rate 18, height 6' (1.829 m), weight 73.483 kg (162 lb), SpO2 99.00%.  Physical Exam  Constitutional: He is oriented to person, place, and time. He appears well-developed and well-nourished.  HENT:  Head: Normocephalic and atraumatic.  Right Ear: External ear normal.  Left Ear: External ear normal.  Nose: Nose normal.  Mouth/Throat: Oropharynx is clear and moist.  Eyes: Conjunctivae and EOM are normal. Pupils are equal, round, and reactive to light.  Cardiovascular: Normal rate and regular rhythm.  Respiratory: Breath sounds normal.  GI: Soft. Bowel sounds are normal.  Musculoskeletal:  Fractured left LE in splint strength is at least 3/5 in left LE 5/5 in RLE  Neurological: He is alert and oriented to person, place, and time. He has normal reflexes.  Skin: Skin is warm and dry.   Assessment/Plan:  L1 burst fracture with significant bony central and left-sided spinal canal compromise. The patient is to be taken to the operating room to undergo surgical decompression of the L1 fracture reduction of the kyphotic deformity and fixation from T12-L2 via a posterior approach.  Najla Aughenbaugh J  04/14/2011, 4:46 PM     

## 2011-04-14 NOTE — H&P (Signed)
Reason for Consult: L1 burst fracture  Referring Physician: Hipolito Bayley M.D.  Norman Pearson is an 59 y.o. male.  HPI: Patient is a 60 year old man who had a 12-15 foot fall from a ladder today. Landed on his outstretched left lower extremity he collapsed to the ground. He has a comminuted fracture of the left lower extremity at the ankle and involving both the tibia and the fibula. He also sustained a burst fracture of the L1 vertebrae with approximately 50% bone retropulsed into the spinal canal. He is being admitted to the trauma service for observation and treatment and Dr. Victorino Dike is planning on placing an external fixator on his left lower extremity. The patient has moderate discomfort because of back pain.  History reviewed. No pertinent past medical history.  Past Surgical History   Procedure  Date   .  Other surgical history      tendon surgery    No family history on file.  Social History: reports that he quit smoking about 37 years ago. He does not have any smokeless tobacco history on file. He reports that he drinks about 1.5 ounces of alcohol per week. He reports that he does not use illicit drugs.  Allergies: No Known Allergies  Medications: I have reviewed the patient's current medications.  Results for orders placed during the hospital encounter of 04/14/11 (from the past 48 hour(s))   CBC Status: Abnormal    Collection Time    04/14/11 10:41 AM   Component  Value  Range  Comment    WBC  11.8 (*)  4.0 - 10.5 (K/uL)     RBC  4.79  4.22 - 5.81 (MIL/uL)     Hemoglobin  15.3  13.0 - 17.0 (g/dL)     HCT  40.9  81.1 - 52.0 (%)     MCV  92.3  78.0 - 100.0 (fL)     MCH  31.9  26.0 - 34.0 (pg)     MCHC  34.6  30.0 - 36.0 (g/dL)     RDW  91.4  78.2 - 15.5 (%)     Platelets  216  150 - 400 (K/uL)    DIFFERENTIAL Status: Abnormal    Collection Time    04/14/11 10:41 AM   Component  Value  Range  Comment    Neutrophils Relative  86 (*)  43 - 77 (%)     Neutro Abs  10.1 (*)  1.7  - 7.7 (K/uL)     Lymphocytes Relative  8 (*)  12 - 46 (%)     Lymphs Abs  0.9  0.7 - 4.0 (K/uL)     Monocytes Relative  6  3 - 12 (%)     Monocytes Absolute  0.7  0.1 - 1.0 (K/uL)     Eosinophils Relative  1  0 - 5 (%)     Eosinophils Absolute  0.1  0.0 - 0.7 (K/uL)     Basophils Relative  0  0 - 1 (%)     Basophils Absolute  0.0  0.0 - 0.1 (K/uL)    COMPREHENSIVE METABOLIC PANEL Status: Abnormal    Collection Time    04/14/11 10:41 AM   Component  Value  Range  Comment    Sodium  137  135 - 145 (mEq/L)     Potassium  3.8  3.5 - 5.1 (mEq/L)     Chloride  104  96 - 112 (mEq/L)     CO2  24  19 - 32 (mEq/L)     Glucose, Bld  132 (*)  70 - 99 (mg/dL)     BUN  15  6 - 23 (mg/dL)     Creatinine, Ser  1.61  0.50 - 1.35 (mg/dL)     Calcium  8.3 (*)  8.4 - 10.5 (mg/dL)     Total Protein  6.3  6.0 - 8.3 (g/dL)     Albumin  3.5  3.5 - 5.2 (g/dL)     AST  46 (*)  0 - 37 (U/L)     ALT  37  0 - 53 (U/L)     Alkaline Phosphatase  56  39 - 117 (U/L)     Total Bilirubin  0.9  0.3 - 1.2 (mg/dL)     GFR calc non Af Amer  69 (*)  >90 (mL/min)     GFR calc Af Amer  80 (*)  >90 (mL/min)    TYPE AND SCREEN Status: Normal    Collection Time    04/14/11 10:45 AM   Component  Value  Range  Comment    ABO/RH(D)  O POS      Antibody Screen  NEG      Sample Expiration  04/17/2011     ABO/RH Status: Normal    Collection Time    04/14/11 10:45 AM   Component  Value  Range  Comment    ABO/RH(D)  O POS      Dg Ankle 2 Views Left  04/14/2011 *RADIOLOGY REPORT* Clinical Data: Larey Seat from 12 feet height. Severe ankle pain. LEFT ANKLE - 2 VIEW Comparison: None. Findings: Highly comminuted fracture of the distal tibial metaphysis is seen with intra-articular extension. No definite the distal fibular fracture identified on this two-view exam. The talus remains centered within the ankle mortise. IMPRESSION: Highly comminuted fracture of the distal tibial metaphysis with intra-articular extension. Original Report  Authenticated By: Danae Orleans, M.D.  Ct Head Wo Contrast  04/14/2011 *RADIOLOGY REPORT* Clinical Data: Fall CT HEAD WITHOUT CONTRAST CT CERVICAL SPINE WITHOUT CONTRAST Technique: Multidetector CT imaging of the head and cervical spine was performed following the standard protocol without intravenous contrast. Multiplanar CT image reconstructions of the cervical spine were also generated. Comparison: Site of Service CT HEAD Findings: The ventricles are normal in size, shape, and position. There is no mass effect or midline shift. No acute hemorrhage or abnormal extra-axial fluid collections are identified. The gray/white differentiation is normal. There is no evidence of skull fracture. The orbits have a normal appearance. The visualized paranasal sinuses are clear well-aerated except for small mucous retention cyst in the left maxillary sinus. IMPRESSION: There is no evidence of acute intracranial abnormality. CT CERVICAL SPINE Findings: The odontoid is intact and the lateral masses are well- aligned. The AP and lateral cervical alignment are normal. The prevertebral soft tissue stripe is within normal limits. There is no evidence of cervical spine fracture or dislocation. A central disc herniation is noted posteriorly at C3-C4. IMPRESSION: There is a central disc herniation at C3-C4. There is no evidence of cervical spine fracture or dislocation. Original Report Authenticated By: Brandon Melnick, M.D.  Ct Cervical Spine Wo Contrast  04/14/2011 *RADIOLOGY REPORT* Clinical Data: Fall CT HEAD WITHOUT CONTRAST CT CERVICAL SPINE WITHOUT CONTRAST Technique: Multidetector CT imaging of the head and cervical spine was performed following the standard protocol without intravenous contrast. Multiplanar CT image reconstructions of the cervical spine were also generated. Comparison: Site of Service CT HEAD Findings:  The ventricles are normal in size, shape, and position. There is no mass effect or midline shift. No acute  hemorrhage or abnormal extra-axial fluid collections are identified. The gray/white differentiation is normal. There is no evidence of skull fracture. The orbits have a normal appearance. The visualized paranasal sinuses are clear well-aerated except for small mucous retention cyst in the left maxillary sinus. IMPRESSION: There is no evidence of acute intracranial abnormality. CT CERVICAL SPINE Findings: The odontoid is intact and the lateral masses are well- aligned. The AP and lateral cervical alignment are normal. The prevertebral soft tissue stripe is within normal limits. There is no evidence of cervical spine fracture or dislocation. A central disc herniation is noted posteriorly at C3-C4. IMPRESSION: There is a central disc herniation at C3-C4. There is no evidence of cervical spine fracture or dislocation. Original Report Authenticated By: Brandon Melnick, M.D.  Ct Lumbar Spine Wo Contrast  04/14/2011 *RADIOLOGY REPORT* Clinical Data: Status post fall. Back pain. CT LUMBAR SPINE WITHOUT CONTRAST Technique: Multidetector CT imaging of the lumbar spine was performed without intravenous contrast administration. Multiplanar CT image reconstructions were also generated. Comparison: None. Findings: The patient has a comminuted superior endplate compression fracture of L1. Vertebral body height loss is estimated at 30%. There is a Schmorl's node in the inferior endplate of L1 which is incidentally noted. A bony fragment measuring approximately 0.9 cm AP by 1.6 cm transverse by 1.7 cm cranial- caudal centrally and eccentric to the left is retropulsed into the canal. The fragment results in moderate to moderately severe central canal stenosis and marked narrowing of the left lateral recess. The patient's fracture does not extend to the posterior elements. No other fracture is identified with vertebral body height otherwise maintained. There is some disc bulging that is L3-4 and L4-5 causing mild central canal  narrowing. Foramina appear open. IMPRESSION: Superior endplate compression fracture deformity of L1 with bony retropulsion into the central canal causing moderate to moderately severe central canal narrowing and marked narrowing of the left lateral recess. Original Report Authenticated By: Bernadene Bell. Maricela Curet, M.D.  Dg Pelvis Portable  04/14/2011 *RADIOLOGY REPORT* Clinical Data: Pain after fall from ladder. PORTABLE PELVIS Comparison: None. Findings: Both femoral heads are located. Sacroiliac joints are symmetric. No acute fracture identified. IMPRESSION: No acute findings. Original Report Authenticated By: Consuello Bossier, M.D.  Dg Chest Port 1 View  04/14/2011 *RADIOLOGY REPORT* Clinical Data: Pain after fall from ladder. PORTABLE CHEST - 1 VIEW Comparison: None. Findings: 2 frontal views. Midline trachea. Normal heart size. No pleural effusion or pneumothorax. There is biapical pleural parenchymal scarring. Lungs otherwise clear. No free intraperitoneal air. IMPRESSION: No acute or post-traumatic deformity within the chest. Original Report Authenticated By: Consuello Bossier, M.D.  Dg Knee Complete 4 Views Left  04/14/2011 *RADIOLOGY REPORT* Clinical Data: Fall from ladder with left lower extremity injuries. LEFT KNEE - COMPLETE 4+ VIEW Comparison: Ankle film earlier today. Findings: There is high fibular fracture with mildly displaced and oblique diaphyseal fracture present. Alignment of the knee is normal. No joint effusion. IMPRESSION: Mildly displaced proximal fibular fracture. Original Report Authenticated By: Reola Calkins, M.D.  Dg Hand Complete Left  04/14/2011 *RADIOLOGY REPORT* Clinical Data: Fall. Laceration. LEFT HAND - COMPLETE 3+ VIEW Comparison: None. Findings: Portable examination without evidence of fracture or dislocation. If there is persistent finger discomfort, cone down view of the specific fingers can be obtained for further delineation with finger spread apart on the lateral view.  IMPRESSION: No fracture  or dislocation. See above. Original Report Authenticated By: Fuller Canada, M.D.   Review of Systems  Constitutional: Negative.  HENT: Negative.  Eyes: Negative.  Respiratory: Negative.  Cardiovascular: Negative.  Gastrointestinal: Negative.  Genitourinary: Negative.  Musculoskeletal: Positive for back pain and falls.  Comminuted fracture of left leg and ankle  Skin: Negative.  Neurological: Negative.  Endo/Heme/Allergies: Negative.  Psychiatric/Behavioral: Negative.   Blood pressure 130/85, pulse 91, temperature 98.1 F (36.7 C), temperature source Oral, resp. rate 18, height 6' (1.829 m), weight 73.483 kg (162 lb), SpO2 99.00%.  Physical Exam  Constitutional: He is oriented to person, place, and time. He appears well-developed and well-nourished.  HENT:  Head: Normocephalic and atraumatic.  Right Ear: External ear normal.  Left Ear: External ear normal.  Nose: Nose normal.  Mouth/Throat: Oropharynx is clear and moist.  Eyes: Conjunctivae and EOM are normal. Pupils are equal, round, and reactive to light.  Cardiovascular: Normal rate and regular rhythm.  Respiratory: Breath sounds normal.  GI: Soft. Bowel sounds are normal.  Musculoskeletal:  Fractured left LE in splint strength is at least 3/5 in left LE 5/5 in RLE  Neurological: He is alert and oriented to person, place, and time. He has normal reflexes.  Skin: Skin is warm and dry.   Assessment/Plan:  L1 burst fracture with significant bony central and left-sided spinal canal compromise. The patient is to be taken to the operating room to undergo surgical decompression of the L1 fracture reduction of the kyphotic deformity and fixation from T12-L2 via a posterior approach.  Kaitlynn Tramontana J  04/14/2011, 4:46 PM

## 2011-04-14 NOTE — Op Note (Signed)
Preoperative diagnosis: L1 burst fracture with retropulsed bone.  Postoperative diagnosis: L1 burst fracture with retropulsed bone Procedure: Posterior decompression of L1 burst fracture posterior fixation T12-L2 with pedicle screws posterior lateral arthrodesis with autograft and allograft T12-L2 Surgeon: Barnett Abu Anesthesia: Gen. endotracheal  Indications: Patient is a 60 year old individual who fell approximately 15 feet from a ladder. He landed on his left leg causing a severe comminuted fracture to the ankle and tibia and fibula. He also sustained an elbow 1 burst fracture with retropulsed bone in the left side of the canal. He is taken to the operating room now to undergo open decompression and fixation of this fracture.  Procedure: A shunt was already in the operating room undergoing Iceman of an external Fixateur in the left lower extremity and debridement and closure of a left finger laceration. I'll under general anesthesia he was carefully turned to the prone position onto another operating table. The bony prominences were appropriately padded and protected. The back was prepped with DuraPrep. He was then draped sterilely. A midline incision was created and carried down to the rectal lumbar dorsal fascia. The vertebrae of L1 was identified positively with fluoroscopy. Subperiosteal dissection was then carried out to expose T12-L1 and L2. The dissection was carried out over the region of the facet joints at the T12-L1 and L1-L2 joints. Laminotomy was then created first on the right side than on the left side at the level of the L1 pedicle. On the left side there was noted to be substantial bone retropulsed dorsally along the border of the pedicle. This could initially be mobilized with an osteotome then required fracture of the fragments to remove them in a piecemeal fashion to allow decompression of the spinal canal. In this fashion we were able to reestablish the normal dimension of the  spinal canal. There was severely degenerated disc material in the T12-L1 disc space that was also removed along with the bony fragments. The inferior endplate of T12 was then decorticated to allow arthrodesis across this joint. Allograft and autograft was packed back into this void to facilitate this.  Then using fluoroscopic guidance replaced pedicle screws in T12 and L2 medial to lateral trajectory was chosen for the T12 pedicle screws, and a traditional lateral trajectory was used at L2. 6.5 x 55 mm screws were used and L2, 5.5 x 40 mm screws were placed in T12. 70 mm rod was placed on the left side to connect the T12 and L2 screws and 80 mm rod was used on the right side. The interspinous ligament was cleared of T12 and L1 to allow placement of a transverse connector. Once all the components were placed there were sequentially tightened to the appropriate torque. Vital radiographs were obtained in the AP and lateral projection demonstrating good position of the fixation. The lateral gutters which had previously been decorticated we then packed with autograft and allograft which wasVitoss bone sponge. Hemostasis was checked and the soft tissues. When this was verified the lumbar dorsal fascia was closed with #1 Vicryl in an interrupted fashion. 2-0 Vicryl was used to close the subcutaneous tissues, and 3-0 Vicryl was used to close subcuticular skin. Blood loss was estimated at 450 cc. 150 cc of Cell Saver blood was returned to the patient. He was returned to the recovery room in stable condition.

## 2011-04-14 NOTE — ED Notes (Signed)
Dr. Cathlean Cower at bedside and seeing patient for ankle injury.  Ortho tech applying splint to LLE.  Trauma to admit.  Dr. Danielle Dess to see patient for L1 fracture and pt aware to stay flat on his back.  Pt still needs hand xray.  Medicated for pain and updated tetanus.  Will continue to monitor.

## 2011-04-14 NOTE — Progress Notes (Signed)
Patient ID: Norman Pearson, male   DOB: March 26, 1951, 60 y.o.   MRN: [id number] Post op Check.  Alert ,oriented, back pain moderate.  Motor function in lower extremities  Good. Sensation good.  Dressing Dry. Stable post op.

## 2011-04-14 NOTE — H&P (Signed)
Norman Pearson is an 60 y.o. male.   Chief Complaint: Back and ankle pain. Norman Pearson fell 15 feet from a ladder and injured his Left ankle and his low back  History reviewed. No pertinent past medical history.  Past Surgical History  Procedure Date  . Other surgical history     tendon surgery    No family history on file. Social History:  reports that he has never smoked. He does not have any smokeless tobacco history on file. He reports that he drinks about 1.5 ounces of alcohol per week. He reports that he does not use illicit drugs.  Allergies: No Known Allergies  Medications Prior to Admission  Medication Dose Route Frequency Provider Last Rate Last Dose  . HYDROmorphone (DILAUDID) injection 1 mg  1 mg Intravenous Once Norman Quarry, MD   1 mg at 04/14/11 1051   No current outpatient prescriptions on file as of 04/14/2011.    Results for orders placed during the hospital encounter of 04/14/11 (from the past 48 hour(s))  CBC     Status: Abnormal   Collection Time   04/14/11 10:41 AM      Component Value Range Comment   WBC 11.8 (*) 4.0 - 10.5 (K/uL)    RBC 4.79  4.22 - 5.81 (MIL/uL)    Hemoglobin 15.3  13.0 - 17.0 (g/dL)    HCT 78.4  69.6 - 29.5 (%)    MCV 92.3  78.0 - 100.0 (fL)    MCH 31.9  26.0 - 34.0 (pg)    MCHC 34.6  30.0 - 36.0 (g/dL)    RDW 28.4  13.2 - 44.0 (%)    Platelets 216  150 - 400 (K/uL)   DIFFERENTIAL     Status: Abnormal   Collection Time   04/14/11 10:41 AM      Component Value Range Comment   Neutrophils Relative 86 (*) 43 - 77 (%)    Neutro Abs 10.1 (*) 1.7 - 7.7 (K/uL)    Lymphocytes Relative 8 (*) 12 - 46 (%)    Lymphs Abs 0.9  0.7 - 4.0 (K/uL)    Monocytes Relative 6  3 - 12 (%)    Monocytes Absolute 0.7  0.1 - 1.0 (K/uL)    Eosinophils Relative 1  0 - 5 (%)    Eosinophils Absolute 0.1  0.0 - 0.7 (K/uL)    Basophils Relative 0  0 - 1 (%)    Basophils Absolute 0.0  0.0 - 0.1 (K/uL)   COMPREHENSIVE METABOLIC PANEL     Status: Abnormal    Collection Time   04/14/11 10:41 AM      Component Value Range Comment   Sodium 137  135 - 145 (mEq/L)    Potassium 3.8  3.5 - 5.1 (mEq/L)    Chloride 104  96 - 112 (mEq/L)    CO2 24  19 - 32 (mEq/L)    Glucose, Bld 132 (*) 70 - 99 (mg/dL)    BUN 15  6 - 23 (mg/dL)    Creatinine, Ser 1.02  0.50 - 1.35 (mg/dL)    Calcium 8.3 (*) 8.4 - 10.5 (mg/dL)    Total Protein 6.3  6.0 - 8.3 (g/dL)    Albumin 3.5  3.5 - 5.2 (g/dL)    AST 46 (*) 0 - 37 (U/L)    ALT 37  0 - 53 (U/L)    Alkaline Phosphatase 56  39 - 117 (U/L)    Total Bilirubin 0.9  0.3 -  1.2 (mg/dL)    GFR calc non Af Amer 69 (*) >90 (mL/min)    GFR calc Af Amer 80 (*) >90 (mL/min)   TYPE AND SCREEN     Status: Normal   Collection Time   04/14/11 10:45 AM      Component Value Range Comment   ABO/RH(D) O POS      Antibody Screen NEG      Sample Expiration 04/17/2011     ABO/RH     Status: Normal   Collection Time   04/14/11 10:45 AM      Component Value Range Comment   ABO/RH(D) O POS      Dg Ankle 2 Views Left  04/14/2011  *RADIOLOGY REPORT*  Clinical Data: Larey Seat from 12 feet height.  Severe ankle pain.  LEFT ANKLE - 2 VIEW  Comparison: None.  Findings: Highly comminuted fracture of the distal tibial metaphysis is seen with intra-articular extension.  No definite the distal fibular fracture identified on this two-view exam.  The talus remains centered within the ankle mortise.  IMPRESSION: Highly comminuted fracture of the distal tibial metaphysis with intra-articular extension.  Original Report Authenticated By: Norman Pearson, M.D.   Ct Head Wo Contrast  04/14/2011  *RADIOLOGY REPORT*  Clinical Data:  Fall  CT HEAD WITHOUT CONTRAST CT CERVICAL SPINE WITHOUT CONTRAST  Technique:  Multidetector CT imaging of the head and cervical spine was performed following the standard protocol without intravenous contrast.  Multiplanar CT image reconstructions of the cervical spine were also generated.  Comparison:  Site of Service  CT  HEAD  Findings: The ventricles are normal in size, shape, and position. There is no mass effect or midline shift.  No acute hemorrhage or abnormal extra-axial fluid collections are identified.  The gray/white differentiation is normal.  There is no evidence of skull fracture.  The orbits have a normal appearance.  The visualized paranasal sinuses are clear well-aerated except for small mucous retention cyst in the left maxillary sinus.  IMPRESSION: There is no evidence of acute intracranial abnormality.  CT CERVICAL SPINE  Findings: The odontoid is intact and the lateral masses are well- aligned.  The AP and lateral cervical alignment are normal.  The prevertebral soft tissue stripe is within normal limits.  There is no evidence of cervical spine fracture or dislocation.  A central disc herniation is noted posteriorly at C3-C4.  IMPRESSION: There is a central disc herniation at C3-C4.  There is no evidence of cervical spine fracture or dislocation.  Original Report Authenticated By: Norman Pearson, M.D.   Ct Cervical Spine Wo Contrast  04/14/2011  *RADIOLOGY REPORT*  Clinical Data:  Fall  CT HEAD WITHOUT CONTRAST CT CERVICAL SPINE WITHOUT CONTRAST  Technique:  Multidetector CT imaging of the head and cervical spine was performed following the standard protocol without intravenous contrast.  Multiplanar CT image reconstructions of the cervical spine were also generated.  Comparison:  Site of Service  CT HEAD  Findings: The ventricles are normal in size, shape, and position. There is no mass effect or midline shift.  No acute hemorrhage or abnormal extra-axial fluid collections are identified.  The gray/white differentiation is normal.  There is no evidence of skull fracture.  The orbits have a normal appearance.  The visualized paranasal sinuses are clear well-aerated except for small mucous retention cyst in the left maxillary sinus.  IMPRESSION: There is no evidence of acute intracranial abnormality.  CT  CERVICAL SPINE  Findings: The odontoid is intact and  the lateral masses are well- aligned.  The AP and lateral cervical alignment are normal.  The prevertebral soft tissue stripe is within normal limits.  There is no evidence of cervical spine fracture or dislocation.  A central disc herniation is noted posteriorly at C3-C4.  IMPRESSION: There is a central disc herniation at C3-C4.  There is no evidence of cervical spine fracture or dislocation.  Original Report Authenticated By: Norman Pearson, M.D.   Dg Pelvis Portable  04/14/2011  *RADIOLOGY REPORT*  Clinical Data: Pain after fall from ladder.  PORTABLE PELVIS  Comparison: None.  Findings: Both femoral heads are located. Sacroiliac joints are symmetric.  No acute fracture identified.  IMPRESSION: No acute findings.  Original Report Authenticated By: Consuello Bossier, M.D.   Dg Chest Port 1 View  04/14/2011  *RADIOLOGY REPORT*  Clinical Data: Pain after fall from ladder.  PORTABLE CHEST - 1 VIEW  Comparison: None.  Findings: 2 frontal views. Midline trachea.  Normal heart size.  No pleural effusion or pneumothorax.  There is biapical pleural parenchymal scarring.  Lungs otherwise clear. No free intraperitoneal air.  IMPRESSION: No acute or post-traumatic deformity within the chest.  Original Report Authenticated By: Consuello Bossier, M.D.   Dg Knee Complete 4 Views Left  04/14/2011  *RADIOLOGY REPORT*  Clinical Data: Fall from ladder with left lower extremity injuries.  LEFT KNEE - COMPLETE 4+ VIEW  Comparison: Ankle film earlier today.  Findings: There is high fibular fracture with mildly displaced and oblique diaphyseal fracture present.  Alignment of the knee is normal.  No joint effusion.  IMPRESSION: Mildly displaced proximal fibular fracture.  Original Report Authenticated By: Reola Calkins, M.D.    Review of Systems  Constitutional: Negative.  Negative for fever.  HENT: Negative.  Negative for ear pain.   Eyes: Negative.  Negative for  blurred vision.  Respiratory: Negative.   Cardiovascular: Negative.   Gastrointestinal: Negative.   Genitourinary: Negative.   Musculoskeletal: Positive for back pain and joint pain.  Skin: Negative.  Negative for rash.  Neurological: Negative.  Negative for weakness and headaches.  Endo/Heme/Allergies: Negative.   Psychiatric/Behavioral: Negative.     Blood pressure 129/82, pulse 76, temperature 98.1 F (36.7 C), temperature source Oral, resp. rate 16, height 6' (1.829 m), weight 73.483 kg (162 lb), SpO2 100.00%. Physical Exam  Vitals reviewed. Constitutional: He is oriented to person, place, and time. He appears well-developed.  HENT:  Head: Normocephalic.  Eyes: Pupils are equal, round, and reactive to light.  Neck: Normal range of motion.  Cardiovascular: Normal rate, normal heart sounds and intact distal pulses.   Respiratory: Effort normal and breath sounds normal.  GI: Soft. Bowel sounds are normal.  Musculoskeletal:       Left ankle: He exhibits decreased range of motion, swelling and deformity. tenderness. Lateral malleolus and medial malleolus tenderness found. Achilles tendon normal.       Hands: Neurological: He is alert and oriented to person, place, and time. No cranial nerve deficit.  Psychiatric: His behavior is normal.     Assessment/Plan Trauma will admit.  Dr. Victorino Dike called and will treat ankle fracture.  Dr. Danielle Dess to see for l1 fracture.    Kinda Pottle A 04/14/2011, 1:46 PM

## 2011-04-14 NOTE — ED Notes (Signed)
Placed nonadhesive bandage to left hand until intervention determined.  Bleeding controlled and the open finger lac is on the left ring finger.  Wrapped with kerlex

## 2011-04-14 NOTE — ED Notes (Signed)
Pt moved to RM 2

## 2011-04-14 NOTE — Transfer of Care (Signed)
Immediate Anesthesia Transfer of Care Note  Patient: Norman Pearson  Procedure(s) Performed:  EXTERNAL FIXATION LEG - External fixation left lower leg; POSTERIOR LUMBAR FUSION 3 LEVEL; IRRIGATION AND DEBRIDEMENT EXTREMITY - Repair of left hand laceration  Patient Location: PACU  Anesthesia Type: General  Level of Consciousness: awake, alert , oriented and patient cooperative  Airway & Oxygen Therapy: Patient Spontanous Breathing and Patient connected to face mask oxygen  Post-op Assessment: Report given to PACU RN, Post -op Vital signs reviewed and stable and Patient moving all extremities X 4  Post vital signs: Reviewed and stable  Complications: No apparent anesthesia complications

## 2011-04-14 NOTE — Brief Op Note (Signed)
04/14/2011  6:48 PM  PATIENT:  Charna Busman  60 y.o. male  PRE-OPERATIVE DIAGNOSIS:  1.  Left tibial-pilon fracture 2.  Left proximal fibula fracture 3.  Left ring finger 3 cm volar laceration  POST-OPERATIVE DIAGNOSIS:  Same  Procedure(s): 1.  Closed reduction left tibial and fibula fractures 2.  Application of multiplane external fixator 3.  Intermediate closure 3 cm laceration left ring finger 4.  Intraop fluoro  SURGEON:  [name], MD  ASSISTANT: n/a  ANESTHESIA:   General  EBL:  minimal  TOURNIQUET: none  COMPLICATIONS:  None apparent  DISPOSITION:  Turned over to Dr. Danielle Dess intubated and in stable condition  DICTATION ID:   [id number]

## 2011-04-14 NOTE — ED Notes (Signed)
Ortho ankle surgery scheduled at 1630 today

## 2011-04-14 NOTE — Op Note (Signed)
NAME:  Norman Pearson, Norman Pearson NO.:  [id number]  MEDICAL RECORD NO.:  [id number]  LOCATION:  [facility]                         FACILITY:  [facility]  PHYSICIAN:  [name], MD        DATE OF BIRTH:  14-Jul-1950  DATE OF PROCEDURE:  04/14/2011 DATE OF DISCHARGE:                              OPERATIVE REPORT   PREOPERATIVE DIAGNOSES: 1. Left distal tibia pilon fracture. 2. Left proximal fibula fracture. 3. Left ring finger 3-cm volar laceration.  POSTOPERATIVE DIAGNOSES: 1. Left distal tibia pilon fracture. 2. Left proximal fibula fracture. 2. Left ring finger 3-cm volar laceration.  PROCEDURE: 1. Closed reduction of the left tibia and fibula fractures. 2. Application of multiplane and external fixator to the left lower     extremity. 3. Intermediate closure of 3-cm laceration on the left ring finger. 4. Intraoperative interpretation of fluoroscopic imaging.  SURGEON:  [name], MD  ANESTHESIA:  General.  IV FLUIDS:  See anesthesia record.  ESTIMATED BLOOD LOSS:  Minimal.  TOURNIQUET TIME:  Zero.  COMPLICATIONS:  None apparent.  DISPOSITION:  Turned over in stable condition, intubated to Dr. Danielle Dess for his portion of the case.  INDICATIONS FOR PROCEDURE:  The patient is a 60 year old male who was working on a ladder today and fell landing on his left foot.  He presented to the emergency department where x-rays revealed a tibial pilon fracture, proximal fibula fracture and L1 burst fracture.  He also sustained a laceration of his left ring finger.  He presents now for operative treatment of these injuries with Dr. Danielle Dess  performing the spine stabilization and the left ring finger and tibial pilon external fixation to be performed by me.  The patient understands the risks and benefits of the various treatment options.  He specifically understands risks of bleeding, infection, nerve damage, blood clots, need for additional surgery, amputation, and death.  He  elects to proceed.  PROCEDURE IN DETAIL:  After preoperative consent was obtained and the correct operative site was identified, the patient was brought to the operating room and placed supine on the gurney.  General anesthesia was induced.  Preoperative antibiotics were administered.  The patient was then moved onto the operating table taking care to log roll during transport.  The left lower extremity was prepped and draped in standard sterile fashion.  A Biomet external fixator was selected.  Two Schanz pins were inserted into the tibial shaft percutaneously.  These were noted to have appropriate purchase.  AP and lateral fluoroscopic images showed appropriate position and length of both pins.  A  bracket was placed on these 2 pins and tightened.  Distally, the calcaneus was identified and a stab incision was made laterally, a 5 mm transfixion pin was inserted through the tuberosity of the calcaneus and a stab incision was made medially to allow passage of the pin.  A lateral view was obtained showing appropriate position of the transfixion pin.  Bar- to-bar clamps were applied proximally and pin-to-bar clamps were applied distally.  A delta frame configuration of bars were then applied. Longitudinal traction and posterior translation was applied and all 4 of the clamps were tightened.  AP and lateral  views showed appropriate reduction of the fracture and appropriate position and length of all hardware.  All of the pins were again tightened.  Xeroform gauze was wrapped around the Schanz pin sites.  Kerlix was wrapped around the pins.  Webril was wrapped around the ankle followed by an Ace wrap for soft tissue support.  All of the Schanz pins were trimmed and capped.  The sterile field for the distal portion of the case was broken down. The left upper extremity was then prepped and draped in standard sterile fashion with the hand position on the hand table.  The laceration  was identified on the volar surface of the ring finger over the middle phalanx.  It was a 3-cm flap of soft tissue that went down to the level of the flexor tendon sheath.  There was no involvement of the digital neurovascular bundles.  Wound was irrigated copiously.  Hemostasis was achieved.  The skin and subcutaneous tissue were debrided of all nonviable tissue.  The laceration was then repaired with horizontal mattress sutures of 4-0 nylon.  Sterile dressings were applied followed by compression wrap.  The patient tolerated these procedures well.  He was then turned over to Dr. Danielle Dess, intubated and in stable condition for his portion of the case.     [name], MD     JH/MEDQ  D:  04/14/2011  T:  04/14/2011  Job:  [id number]

## 2011-04-14 NOTE — Anesthesia Procedure Notes (Signed)
Procedures

## 2011-04-14 NOTE — ED Notes (Addendum)
Fall from ladder 12-65ft onto hardsurface. Suspected left ankle fx. C/o pain to lower back. Left hand laceration.  bolus. Received 4mg  morphine. LAC #18 PTA

## 2011-04-14 NOTE — OR Nursing (Signed)
Dr. Laverta Baltimore part of procedure ended @ 1841, Dr. Victorino Dike left @ [phone]. Procedure # 2 with Dr. Danielle Dess started @ 1924.

## 2011-04-14 NOTE — Progress Notes (Signed)
Chaplain Note: Pt. Came to ED after experiencing fall at work. Assist Pt.s wife in locating husband. Provided listening and encouragement.   04/14/11 1200  Clinical Encounter Type  Visited With Patient and family together  Visit Type ED;Trauma  Referral From Nurse  Spiritual Encounters  Spiritual Needs Emotional  Stress Factors  Patient Stress Factors Other (Comment) (Anxiety)  Family Stress Factors Other (Comment) (fearful of outcome.)

## 2011-04-14 NOTE — Preoperative (Signed)
Beta Blockers   Reason not to administer Beta Blockers:Patient not on BB

## 2011-04-14 NOTE — Anesthesia Postprocedure Evaluation (Signed)
  Anesthesia Post-op Note  Patient: Norman Pearson  Procedure(s) Performed:  EXTERNAL FIXATION LEG - External fixation left lower leg; POSTERIOR LUMBAR FUSION 3 LEVEL; IRRIGATION AND DEBRIDEMENT EXTREMITY - Repair of left hand laceration  Patient Location: PACU  Anesthesia Type: General  Level of Consciousness: awake, alert  and oriented  Airway and Oxygen Therapy: Patient Spontanous Breathing and Patient connected to nasal cannula oxygen  Post-op Pain: mild  Post-op Assessment: Post-op Vital signs reviewed, Patient's Cardiovascular Status Stable, Respiratory Function Stable, Patent Airway, No signs of Nausea or vomiting and Pain level controlled  Post-op Vital Signs: Reviewed and stable  Complications: No apparent anesthesia complications

## 2011-04-14 NOTE — Anesthesia Preprocedure Evaluation (Signed)
Anesthesia Evaluation  Patient identified by MRN, date of birth, ID band Patient awake    Airway       Dental   Pulmonary          Cardiovascular     Neuro/Psych    GI/Hepatic   Endo/Other    Renal/GU      Musculoskeletal   Abdominal   Peds  Hematology   Anesthesia Other Findings   Reproductive/Obstetrics                           Anesthesia Physical Anesthesia Plan  ASA: II  Anesthesia Plan: General   Post-op Pain Management:    Induction: Intravenous, Rapid sequence and Cricoid pressure planned  Airway Management Planned: Oral ETT  Additional Equipment:   Intra-op Plan:   Post-operative Plan: Extubation in OR  Informed Consent: I have reviewed the patients History and Physical, chart, labs and discussed the procedure including the risks, benefits and alternatives for the proposed anesthesia with the patient or authorized representative who has indicated his/her understanding and acceptance.     Plan Discussed with: CRNA and Surgeon  Anesthesia Plan Comments:         Anesthesia Quick Evaluation

## 2011-04-14 NOTE — H&P (Signed)
Norman Pearson is an 60 y.o. male.   Chief Complaint: I fell from a ladder  HPI: 60 yo WM s/p fall from a ladder from about 12 feet. No LOC.  Fell and landed on legs. Was able to call 911 himself.  C/o LLE pain and back pain. Denies numbness, tingling, or paraesthesias in extremities. Otherwise very healthy. Brought in as a nontrauma code.  History reviewed. No pertinent past medical history.  Past Surgical History  Procedure Date  . Other surgical history     tendon surgery    No family history on file. Social History:  reports that he quit smoking about 37 years ago. He does not have any smokeless tobacco history on file. He reports that he drinks about 1.5 ounces of alcohol per week. He reports that he does not use illicit drugs.  Allergies: No Known Allergies  Medications Prior to Admission  Medication Dose Route Frequency Provider Last Rate Last Dose  . HYDROmorphone (DILAUDID) 1 MG/ML injection        1 mg at 04/14/11 1402  . HYDROmorphone (DILAUDID) injection 1 mg  1 mg Intravenous Once Hilario Quarry, MD   1 mg at 04/14/11 1051  . TDaP (BOOSTRIX) 5-2.5-18.5 LF-MCG/0.5 injection        0.5 mL at 04/14/11 1403  . DISCONTD: HYDROmorphone (DILAUDID) injection 1 mg  1 mg Intravenous Once Hilario Quarry, MD      . DISCONTD: TDaP (BOOSTRIX) injection 0.5 mL  0.5 mL Intramuscular Once Hilario Quarry, MD       No current outpatient prescriptions on file as of 04/14/2011.    Results for orders placed during the hospital encounter of 04/14/11 (from the past 48 hour(s))  CBC     Status: Abnormal   Collection Time   04/14/11 10:41 AM      Component Value Range Comment   WBC 11.8 (*) 4.0 - 10.5 (K/uL)    RBC 4.79  4.22 - 5.81 (MIL/uL)    Hemoglobin 15.3  13.0 - 17.0 (g/dL)    HCT 84.6  96.2 - 95.2 (%)    MCV 92.3  78.0 - 100.0 (fL)    MCH 31.9  26.0 - 34.0 (pg)    MCHC 34.6  30.0 - 36.0 (g/dL)    RDW 84.1  32.4 - 40.1 (%)    Platelets 216  150 - 400 (K/uL)   DIFFERENTIAL      Status: Abnormal   Collection Time   04/14/11 10:41 AM      Component Value Range Comment   Neutrophils Relative 86 (*) 43 - 77 (%)    Neutro Abs 10.1 (*) 1.7 - 7.7 (K/uL)    Lymphocytes Relative 8 (*) 12 - 46 (%)    Lymphs Abs 0.9  0.7 - 4.0 (K/uL)    Monocytes Relative 6  3 - 12 (%)    Monocytes Absolute 0.7  0.1 - 1.0 (K/uL)    Eosinophils Relative 1  0 - 5 (%)    Eosinophils Absolute 0.1  0.0 - 0.7 (K/uL)    Basophils Relative 0  0 - 1 (%)    Basophils Absolute 0.0  0.0 - 0.1 (K/uL)   COMPREHENSIVE METABOLIC PANEL     Status: Abnormal   Collection Time   04/14/11 10:41 AM      Component Value Range Comment   Sodium 137  135 - 145 (mEq/L)    Potassium 3.8  3.5 - 5.1 (mEq/L)  Chloride 104  96 - 112 (mEq/L)    CO2 24  19 - 32 (mEq/L)    Glucose, Bld 132 (*) 70 - 99 (mg/dL)    BUN 15  6 - 23 (mg/dL)    Creatinine, Ser 1.61  0.50 - 1.35 (mg/dL)    Calcium 8.3 (*) 8.4 - 10.5 (mg/dL)    Total Protein 6.3  6.0 - 8.3 (g/dL)    Albumin 3.5  3.5 - 5.2 (g/dL)    AST 46 (*) 0 - 37 (U/L)    ALT 37  0 - 53 (U/L)    Alkaline Phosphatase 56  39 - 117 (U/L)    Total Bilirubin 0.9  0.3 - 1.2 (mg/dL)    GFR calc non Af Amer 69 (*) >90 (mL/min)    GFR calc Af Amer 80 (*) >90 (mL/min)   TYPE AND SCREEN     Status: Normal   Collection Time   04/14/11 10:45 AM      Component Value Range Comment   ABO/RH(D) O POS      Antibody Screen NEG      Sample Expiration 04/17/2011     ABO/RH     Status: Normal   Collection Time   04/14/11 10:45 AM      Component Value Range Comment   ABO/RH(D) O POS      RADIOLOGICAL STUDIES: I have personally reviewed the images myself  Dg Ankle 2 Views Left  04/14/2011  *RADIOLOGY REPORT*  Clinical Data: Larey Seat from 12 feet height.  Severe ankle pain.  LEFT ANKLE - 2 VIEW  Comparison: None.  Findings: Highly comminuted fracture of the distal tibial metaphysis is seen with intra-articular extension.  No definite the distal fibular fracture identified on this  two-view exam.  The talus remains centered within the ankle mortise.  IMPRESSION: Highly comminuted fracture of the distal tibial metaphysis with intra-articular extension.  Original Report Authenticated By: Danae Orleans, M.D.   Ct Head Wo Contrast  04/14/2011  *RADIOLOGY REPORT*  Clinical Data:  Fall  CT HEAD WITHOUT CONTRAST CT CERVICAL SPINE WITHOUT CONTRAST  Technique:  Multidetector CT imaging of the head and cervical spine was performed following the standard protocol without intravenous contrast.  Multiplanar CT image reconstructions of the cervical spine were also generated.  Comparison:  Site of Service  CT HEAD  Findings: The ventricles are normal in size, shape, and position. There is no mass effect or midline shift.  No acute hemorrhage or abnormal extra-axial fluid collections are identified.  The gray/white differentiation is normal.  There is no evidence of skull fracture.  The orbits have a normal appearance.  The visualized paranasal sinuses are clear well-aerated except for small mucous retention cyst in the left maxillary sinus.  IMPRESSION: There is no evidence of acute intracranial abnormality.  CT CERVICAL SPINE  Findings: The odontoid is intact and the lateral masses are well- aligned.  The AP and lateral cervical alignment are normal.  The prevertebral soft tissue stripe is within normal limits.  There is no evidence of cervical spine fracture or dislocation.  A central disc herniation is noted posteriorly at C3-C4.  IMPRESSION: There is a central disc herniation at C3-C4.  There is no evidence of cervical spine fracture or dislocation.  Original Report Authenticated By: Brandon Melnick, M.D.   Ct Cervical Spine Wo Contrast  04/14/2011  *RADIOLOGY REPORT*  Clinical Data:  Fall  CT HEAD WITHOUT CONTRAST CT CERVICAL SPINE WITHOUT CONTRAST  Technique:  Multidetector  CT imaging of the head and cervical spine was performed following the standard protocol without intravenous contrast.   Multiplanar CT image reconstructions of the cervical spine were also generated.  Comparison:  Site of Service  CT HEAD  Findings: The ventricles are normal in size, shape, and position. There is no mass effect or midline shift.  No acute hemorrhage or abnormal extra-axial fluid collections are identified.  The gray/white differentiation is normal.  There is no evidence of skull fracture.  The orbits have a normal appearance.  The visualized paranasal sinuses are clear well-aerated except for small mucous retention cyst in the left maxillary sinus.  IMPRESSION: There is no evidence of acute intracranial abnormality.  CT CERVICAL SPINE  Findings: The odontoid is intact and the lateral masses are well- aligned.  The AP and lateral cervical alignment are normal.  The prevertebral soft tissue stripe is within normal limits.  There is no evidence of cervical spine fracture or dislocation.  A central disc herniation is noted posteriorly at C3-C4.  IMPRESSION: There is a central disc herniation at C3-C4.  There is no evidence of cervical spine fracture or dislocation.  Original Report Authenticated By: Brandon Melnick, M.D.   Ct Lumbar Spine Wo Contrast  04/14/2011  *RADIOLOGY REPORT*  Clinical Data: Status post fall.  Back pain.  CT LUMBAR SPINE WITHOUT CONTRAST  Technique:  Multidetector CT imaging of the lumbar spine was performed without intravenous contrast administration. Multiplanar CT image reconstructions were also generated.  Comparison: None.  Findings: The patient has a comminuted superior endplate compression fracture of L1. Vertebral body height loss is estimated at 30%.  There is a Schmorl's node in the inferior endplate of L1 which is incidentally noted.  A bony fragment measuring approximately 0.9 cm AP by 1.6 cm transverse by 1.7 cm cranial- caudal centrally and eccentric to the left is retropulsed into the canal.  The fragment results in moderate to moderately severe central canal stenosis and marked  narrowing of the left lateral recess.  The patient's fracture does not extend to the posterior elements.  No other fracture is identified with vertebral body height otherwise maintained.  There is some disc bulging that is L3-4 and L4-5 causing mild central canal narrowing.  Foramina appear open.  IMPRESSION: Superior endplate compression fracture deformity of L1 with bony retropulsion into the central canal causing moderate to moderately severe central canal narrowing and marked narrowing of the left lateral recess.  Original Report Authenticated By: Bernadene Bell. Maricela Curet, M.D.   Dg Pelvis Portable  04/14/2011  *RADIOLOGY REPORT*  Clinical Data: Pain after fall from ladder.  PORTABLE PELVIS  Comparison: None.  Findings: Both femoral heads are located. Sacroiliac joints are symmetric.  No acute fracture identified.  IMPRESSION: No acute findings.  Original Report Authenticated By: Consuello Bossier, M.D.   Dg Chest Port 1 View  04/14/2011  *RADIOLOGY REPORT*  Clinical Data: Pain after fall from ladder.  PORTABLE CHEST - 1 VIEW  Comparison: None.  Findings: 2 frontal views. Midline trachea.  Normal heart size.  No pleural effusion or pneumothorax.  There is biapical pleural parenchymal scarring.  Lungs otherwise clear. No free intraperitoneal air.  IMPRESSION: No acute or post-traumatic deformity within the chest.  Original Report Authenticated By: Consuello Bossier, M.D.   Dg Knee Complete 4 Views Left  04/14/2011  *RADIOLOGY REPORT*  Clinical Data: Fall from ladder with left lower extremity injuries.  LEFT KNEE - COMPLETE 4+ VIEW  Comparison: Ankle film earlier  today.  Findings: There is high fibular fracture with mildly displaced and oblique diaphyseal fracture present.  Alignment of the knee is normal.  No joint effusion.  IMPRESSION: Mildly displaced proximal fibular fracture.  Original Report Authenticated By: Reola Calkins, M.D.   Dg Hand Complete Left  04/14/2011  *RADIOLOGY REPORT*  Clinical  Data: Fall.  Laceration.  LEFT HAND - COMPLETE 3+ VIEW  Comparison: None.  Findings: Portable examination without evidence of fracture or dislocation.  If there  is persistent finger discomfort, cone down view of the specific fingers can be obtained for further delineation with finger spread apart on the lateral view.  IMPRESSION: No fracture or dislocation.  See above.  Original Report Authenticated By: Fuller Canada, M.D.    Review of Systems  Constitutional: Negative for weight loss and diaphoresis.  HENT: Negative for hearing loss and ear pain.   Eyes: Negative for blurred vision, double vision and pain.  Respiratory: Negative for cough, shortness of breath, wheezing and stridor.   Cardiovascular: Negative for chest pain and palpitations.  Gastrointestinal: Negative for nausea, vomiting and abdominal pain.  Genitourinary: Negative for dysuria, urgency, hematuria and flank pain.  Musculoskeletal: Positive for back pain.       See hpi  Neurological: Negative for dizziness, tingling, sensory change, loss of consciousness, weakness and headaches.  Psychiatric/Behavioral: Negative.     Blood pressure 129/72, pulse 82, temperature 98.1 F (36.7 C), temperature source Oral, resp. rate 18, height 6' (1.829 m), weight 162 lb (73.483 kg), SpO2 100.00%. Physical Exam  Vitals reviewed. Constitutional: He is oriented to person, place, and time. He appears well-developed and well-nourished. No distress.  HENT:  Head: Normocephalic and atraumatic.  Right Ear: External ear normal.  Left Ear: External ear normal.  Nose: Nose normal.  Mouth/Throat: Oropharynx is clear and moist.  Eyes: Conjunctivae and EOM are normal. Pupils are equal, round, and reactive to light. Right eye exhibits no discharge. Left eye exhibits no discharge. No scleral icterus.  Neck: Normal range of motion. Neck supple. No JVD present. No tracheal deviation present.  Cardiovascular: Normal rate, regular rhythm, normal heart  sounds and intact distal pulses.   Pulses:      Radial pulses are 2+ on the right side, and 2+ on the left side.       Femoral pulses are 2+ on the right side, and 2+ on the left side.      Dorsalis pedis pulses are 2+ on the right side.       Posterior tibial pulses are 2+ on the right side.       Unable to assess LLE pulses but brisk cap refill  Respiratory: Effort normal and breath sounds normal. No respiratory distress. He has no wheezes. He exhibits no tenderness.  GI: Soft. Bowel sounds are normal. He exhibits no distension. There is no tenderness. There is no rebound.  Genitourinary: Rectum normal and penis normal.  Musculoskeletal:       Left ankle: He exhibits swelling and deformity.       Left hand: He exhibits laceration.       Hands:      LLE in splint  Lymphadenopathy:    He has no cervical adenopathy.  Neurological: He is alert and oriented to person, place, and time. No sensory deficit. GCS eye subscore is 4. GCS verbal subscore is 5. GCS motor subscore is 6.       Grossly normal motor and sensation in b/l LE. Strength & sensation  wnl in L hand and along L 4th finger grossly.   Skin: Skin is warm and dry. Laceration (left 4th finger laceration on ventral aspect on intermediate phalanges pad) noted. He is not diaphoretic.     Assessment/Plan   Diagnoses  . Unstable burst fracture of first lumbar vertebra  . Accidental fall from ladder  . Closed fracture of left fibula  . Laceration of fourth finger of left hand  . Closed fracture of left distal tibia   Dr Victorino Dike to place ex-fix on L ankle later today. Will ask Dr Victorino Dike or ED to repair hand lac Awaiting Dr Verlee Rossetti consultation regarding L1 burst fracture. Will maintain L spine precautions. Pt recd tetanus shot today Hold chemical VTE prophylaxis until we have NSG recommendation.   Mary Sella. Andrey Campanile, MD, FACS General, Bariatric, & Minimally Invasive Surgery [facility], [place]   [facility] M 04/14/2011,  3:08 PM

## 2011-04-14 NOTE — Consult Note (Signed)
Addendum Posterior splint applied.  DP pulse dopplered and intact.

## 2011-04-15 ENCOUNTER — Inpatient Hospital Stay (HOSPITAL_COMMUNITY): Payer: BC Managed Care – PPO

## 2011-04-15 ENCOUNTER — Encounter (HOSPITAL_COMMUNITY): Payer: Self-pay | Admitting: Orthopedic Surgery

## 2011-04-15 DIAGNOSIS — D62 Acute posthemorrhagic anemia: Secondary | ICD-10-CM | POA: Diagnosis not present

## 2011-04-15 LAB — BASIC METABOLIC PANEL
BUN: 16 mg/dL (ref 6–23)
CO2: 24 mEq/L (ref 19–32)
Chloride: 107 mEq/L (ref 96–112)
Creatinine, Ser: 1 mg/dL (ref 0.50–1.35)
Potassium: 4.3 mEq/L (ref 3.5–5.1)

## 2011-04-15 LAB — CBC
HCT: 37 % — ABNORMAL LOW (ref 39.0–52.0)
MCV: 93 fL (ref 78.0–100.0)
RBC: 3.98 MIL/uL — ABNORMAL LOW (ref 4.22–5.81)
WBC: 11.1 10*3/uL — ABNORMAL HIGH (ref 4.0–10.5)

## 2011-04-15 LAB — MRSA PCR SCREENING: MRSA by PCR: NEGATIVE

## 2011-04-15 MED ORDER — OXYCODONE-ACETAMINOPHEN 5-325 MG PO TABS
1.0000 | ORAL_TABLET | ORAL | Status: DC | PRN
  Administered 2011-04-15: 1 via ORAL
  Administered 2011-04-15: 2 via ORAL
  Administered 2011-04-15: 1 via ORAL
  Administered 2011-04-16 – 2011-04-20 (×3): 2 via ORAL
  Filled 2011-04-15 (×2): qty 2
  Filled 2011-04-15: qty 1
  Filled 2011-04-15 (×2): qty 2
  Filled 2011-04-15: qty 1
  Filled 2011-04-15: qty 2

## 2011-04-15 NOTE — Progress Notes (Signed)
Physical Therapy Evaluation Patient Details Name: Norman Pearson MRN: [id number] DOB: 1951/04/06 Today's Date: 04/15/2011  Problem List:  Patient Active Problem List  Diagnoses  . Unstable burst fracture of first lumbar vertebra  . Accidental fall from ladder  . Closed fracture of left fibula  . Laceration of fourth finger of left hand  . Closed fracture of left distal tibia  . Anemia associated with acute blood loss  . Hypocalcemia    Past Medical History: History reviewed. No pertinent past medical history. Past Surgical History:  Past Surgical History  Procedure Date  . Other surgical history     tendon surgery  . External fixation leg 04/14/2011    Procedure: EXTERNAL FIXATION LEG;  Surgeon: [name], MD;  Location: [facility] OR;  Service: Orthopedics;  Laterality: Left;  External fixation left lower leg  . I&d extremity 04/14/2011    Procedure: IRRIGATION AND DEBRIDEMENT EXTREMITY;  Surgeon: [name], MD;  Location: [facility] OR;  Service: Orthopedics;  Laterality: Left;  Repair of left hand laceration    PT Assessment/Plan/Recommendation PT Assessment Clinical Impression Statement: Pt is s/p fall from ladder with LLE fractures and L1 burst fx requiring spinal stabilization surgery.  Pt mobilizing well despite pain, however may not be able to tolerate ambulation/hopping on RLE due to back injuries. Will benefit from PT to address the following deficits and assist with d/c planning to appropriate venue. PT Recommendation/Assessment: Patient will need skilled PT in the acute care venue PT Problem List: Decreased activity tolerance;Decreased mobility;Decreased knowledge of use of DME;Decreased knowledge of precautions;Pain Problem List Comments: and NWB LLE Barriers to Discharge: Decreased caregiver support (wife with MS and can only provide supervision) PT Therapy Diagnosis : Difficulty walking;Acute pain PT Plan PT Frequency: Min 5X/week PT Treatment/Interventions: DME  instruction;Gait training;Functional mobility training;Patient/family education;Wheelchair mobility training PT Recommendation Recommendations for Other Services: Rehab consult Follow Up Recommendations: Inpatient Rehab;24 hour supervision/assistance Equipment Recommended: Defer to next venue PT Goals  Acute Rehab PT Goals PT Goal Formulation: With patient Time For Goal Achievement: 2 weeks Pt will Roll Supine to Right Side: with modified independence PT Goal: Rolling Supine to Right Side - Progress: Other (comment) Pt will Roll Supine to Left Side: with modified independence PT Goal: Rolling Supine to Left Side - Progress: Other (comment) Pt will go Supine/Side to Sit: with supervision;with HOB 0 degrees;with cues (comment type and amount) (cues for moving LLE with external fixator) PT Goal: Supine/Side to Sit - Progress: Other (comment) Pt will go Sit to Supine/Side: with supervision;with HOB 0 degrees;with cues (comment type and amount) (cues for managing external fixator) PT Goal: Sit to Supine/Side - Progress: Other (comment) Pt will Transfer Sit to Stand/Stand to Sit: with supervision;with upper extremity assist PT Transfer Goal: Sit to Stand/Stand to Sit - Progress: Other (comment) Pt will Transfer Bed to Chair/Chair to Bed: with supervision PT Transfer Goal: Bed to Chair/Chair to Bed - Progress: Other (comment) Pt will Ambulate: 1 - 15 feet;with supervision;with least restrictive assistive device PT Goal: Ambulate - Progress: Other (comment) Pt will Propel Wheelchair: 51 - 150 feet;with supervision;Other (comment) (in simulated home environment) PT Goal: Propel Wheelchair - Progress: Other (comment)  PT Evaluation Precautions/Restrictions  Precautions Precautions: Back Precaution Booklet Issued: No Required Braces or Orthoses: Yes Spinal Brace: Thoracolumbosacral orthotic;Applied in supine position (Await MD to OK to don brace in sitting (as ordered)) Restrictions Weight  Bearing Restrictions: Yes LLE Weight Bearing: Non weight bearing Other Position/Activity Restrictions: external fixator on LLE Prior Functioning  Home Living Lives With: Spouse Receives Help From: Family (daughter lives next door) Type of Home: House Home Layout: One level Home Access: Level entry;Other (comment) (threshold) Bathroom Shower/Tub: Walk-in shower;Curtain Bathroom Toilet: Handicapped height Bathroom Accessibility: Yes How Accessible: Accessible via wheelchair;Accessible via walker Home Adaptive Equipment: Built-in shower seat;Hand-held shower hose;Grab bars in shower;Straight cane;Walker - four wheeled;Walker - rolling;Wheelchair - manual Additional Comments: unsure if legrests elevate on w/c Prior Function Level of Independence: Independent with basic ADLs;Independent with homemaking with ambulation Driving: Yes Vocation: Full time employment Office manager) Cognition Cognition Arousal/Alertness: Awake/alert Overall Cognitive Status: Appears within functional limits for tasks assessed Orientation Level: Oriented X4 Sensation/Coordination Sensation Light Touch: Impaired Detail Light Touch Impaired Details: Impaired LLE Additional Comments: decr to light touch to Lt great toe (all others intact) Extremity Assessment RUE Assessment RUE Assessment: Within Functional Limits LUE Assessment LUE Assessment: Within Functional Limits (L hand with dsg; per ortho MD able to use for amb & transfer) RLE Assessment RLE Assessment: Within Functional Limits LLE Assessment LLE Assessment: Exceptions to [facility] LLE Strength LLE Overall Strength: Deficits;Due to pain LLE Overall Strength Comments: requires assist due to pain and weight of external fixator; wife present and educated re: holding fixator and positioning to elevate and decr edema Mobility (including Balance) Bed Mobility Bed Mobility: Yes Rolling Right: 3: Mod assist;With rail Rolling Right Details (indicate cue type and  reason): vc for technique to avoid twisting; assist to maneuver LLE with external fixator Rolling Left: 4: Min assist;With rail Right Sidelying to Sit: 3: Mod assist;HOB flat;With rails Right Sidelying to Sit Details (indicate cue type and reason): vc for technique to maintain back precautions; assist with LLE Sitting - Scoot to Edge of Bed: 5: Supervision Sitting - Scoot to Edge of Bed Details (indicate cue type and reason): vc to maintain NWB LLE when scooting to EOB Sit to Supine - Right: 3: Mod assist;With rail;HOB flat Sit to Supine - Right Details (indicate cue type and reason): vc for technique to maintain back precautions; assist for LEs onto bed Transfers Transfers: Yes Sit to Stand: 4: Min assist;From elevated surface;With upper extremity assist;From bed Sit to Stand Details (indicate cue type and reason): Bed elevated ~8 inches; pt with one hand pushing off bed, other on RW and PT anchoring RW for safety; vc for technique Stand to Sit: 4: Min assist;To elevated surface;With upper extremity assist;To bed Stand to Sit Details: bed elevated ~4 inches Ambulation/Gait Ambulation/Gait: No (pt unable to tolerate "hopping" on RLE due to back pain) Stairs: No Wheelchair Mobility Wheelchair Mobility: No    Exercise    End of Session PT - End of Session Equipment Utilized During Treatment: Gait belt;Back brace Activity Tolerance: Other (comment) (limited by nausea and pain) Patient left: in bed;with call bell in reach;with family/visitor present Nurse Communication: Mobility status for transfers (to don brace in supine until clarified by MD) General Behavior During Session: [facility] for tasks performed Cognition: [facility] for tasks performed  [place] Delauder 04/15/2011, 4:22 PM

## 2011-04-15 NOTE — Progress Notes (Signed)
Trauma Service Note  Subjective: Patient states that he is doing very well from a pain control standpoint  Objective: Vital signs in last 24 hours: Temp:  [97.8 F (36.6 C)-99.1 F (37.3 C)] 98.9 F (37.2 C) (11/21 0800) Pulse Rate:  [72-92] 81  (11/21 1000) Resp:  [5-20] 7  (11/21 1000) BP: (114-151)/(66-85) 125/73 mmHg (11/21 1000) SpO2:  [90 %-100 %] 100 % (11/21 1000)    Intake/Output from previous day: 11/20 0701 - 11/21 0700 In: 4089.3 [I.V.:3789.3; Blood:250] Out: 1725 [Urine:1075; Blood:650] Intake/Output this shift: Total I/O In: 319.3 [P.O.:240; I.V.:79.3] Out: 240 [Urine:240]  General: Looks good, no acute distress  Lungs: Clear  Abd: Soft, and non-tender  Extremities: External fixator on LLE, to be redone in two weeks per Dr. Victorino Dike  Neuro: Intact  Lab Results: CBC   Basename 04/15/11 0600 04/14/11 1041  WBC 11.1* 11.8*  HGB 12.7* 15.3  HCT 37.0* 44.2  PLT 161 216   BMET  Basename 04/15/11 0600 04/14/11 1041  NA 137 137  K 4.3 3.8  CL 107 104  CO2 24 24  GLUCOSE 126* 132*  BUN 16 15  CREATININE 1.00 1.12  CALCIUM 7.6* 8.3*   PT/INR No results found for this basename: LABPROT:2,INR:2 in the last 72 hours ABG No results found for this basename: PHART:2,PCO2:2,PO2:2,HCO3:2 in the last 72 hours  Studies/Results: Dg Thoracolumabar Spine  04/14/2011  *RADIOLOGY REPORT*  Clinical Data: L1 burst fracture.  T12-L2 fusion.  OPERATIVE THORACOLUMBAR SPINE - 2 VIEW 04/14/2011:  Comparison: CT lumbar spine earlier same date.  Findings: 3 spot images from the C-arm fluoroscopic device were submitted for interpretation post-operatively.  Initial image demonstrated a localizer device posterior to the fractured L1 vertebral body.  Second and third images demonstrate posterior fusion with hardware from T12-L2 without visible complication.  IMPRESSION: T12-L2 fusion to stabilize the L1 burst fracture.  Original Report Authenticated By: Arnell Sieving, M.D.    Dg Tibia/fibula Left  04/14/2011  *RADIOLOGY REPORT*  Clinical Data: Severely comminuted distal tibia fracture. Placement of external fixator.  OPERATIVE LEFT TIBIA AND FIBULA - 2 VIEW 04/14/2011:  Comparison: Left ankle x-rays earlier same date.  Findings: 5 spot images from the C-arm fluoroscopic device were submitted for interpretation postoperatively.  External fixator screws are present within the mid shaft of the tibia.  A portion of the fixator device was placed into the posterior calcaneus. Severely comminuted distal tibia fracture again noted.  IMPRESSION: Images obtained during placement of external fixator into the left tibia and calcaneus.  Original Report Authenticated By: Arnell Sieving, M.D.   Dg Ankle 2 Views Left  04/14/2011  *RADIOLOGY REPORT*  Clinical Data: Larey Seat from 12 feet height.  Severe ankle pain.  LEFT ANKLE - 2 VIEW  Comparison: None.  Findings: Highly comminuted fracture of the distal tibial metaphysis is seen with intra-articular extension.  No definite the distal fibular fracture identified on this two-view exam.  The talus remains centered within the ankle mortise.  IMPRESSION: Highly comminuted fracture of the distal tibial metaphysis with intra-articular extension.  Original Report Authenticated By: Danae Orleans, M.D.   Ct Head Wo Contrast  04/14/2011  *RADIOLOGY REPORT*  Clinical Data:  Fall  CT HEAD WITHOUT CONTRAST CT CERVICAL SPINE WITHOUT CONTRAST  Technique:  Multidetector CT imaging of the head and cervical spine was performed following the standard protocol without intravenous contrast.  Multiplanar CT image reconstructions of the cervical spine were also generated.  Comparison:  Site of Service  CT HEAD  Findings: The ventricles are normal in size, shape, and position. There is no mass effect or midline shift.  No acute hemorrhage or abnormal extra-axial fluid collections are identified.  The gray/white differentiation is normal.  There is no evidence of  skull fracture.  The orbits have a normal appearance.  The visualized paranasal sinuses are clear well-aerated except for small mucous retention cyst in the left maxillary sinus.  IMPRESSION: There is no evidence of acute intracranial abnormality.  CT CERVICAL SPINE  Findings: The odontoid is intact and the lateral masses are well- aligned.  The AP and lateral cervical alignment are normal.  The prevertebral soft tissue stripe is within normal limits.  There is no evidence of cervical spine fracture or dislocation.  A central disc herniation is noted posteriorly at C3-C4.  IMPRESSION: There is a central disc herniation at C3-C4.  There is no evidence of cervical spine fracture or dislocation.  Original Report Authenticated By: Brandon Melnick, M.D.   Ct Cervical Spine Wo Contrast  04/14/2011  *RADIOLOGY REPORT*  Clinical Data:  Fall  CT HEAD WITHOUT CONTRAST CT CERVICAL SPINE WITHOUT CONTRAST  Technique:  Multidetector CT imaging of the head and cervical spine was performed following the standard protocol without intravenous contrast.  Multiplanar CT image reconstructions of the cervical spine were also generated.  Comparison:  Site of Service  CT HEAD  Findings: The ventricles are normal in size, shape, and position. There is no mass effect or midline shift.  No acute hemorrhage or abnormal extra-axial fluid collections are identified.  The gray/white differentiation is normal.  There is no evidence of skull fracture.  The orbits have a normal appearance.  The visualized paranasal sinuses are clear well-aerated except for small mucous retention cyst in the left maxillary sinus.  IMPRESSION: There is no evidence of acute intracranial abnormality.  CT CERVICAL SPINE  Findings: The odontoid is intact and the lateral masses are well- aligned.  The AP and lateral cervical alignment are normal.  The prevertebral soft tissue stripe is within normal limits.  There is no evidence of cervical spine fracture or  dislocation.  A central disc herniation is noted posteriorly at C3-C4.  IMPRESSION: There is a central disc herniation at C3-C4.  There is no evidence of cervical spine fracture or dislocation.  Original Report Authenticated By: Brandon Melnick, M.D.   Ct Lumbar Spine Wo Contrast  04/14/2011  *RADIOLOGY REPORT*  Clinical Data: Status post fall.  Back pain.  CT LUMBAR SPINE WITHOUT CONTRAST  Technique:  Multidetector CT imaging of the lumbar spine was performed without intravenous contrast administration. Multiplanar CT image reconstructions were also generated.  Comparison: None.  Findings: The patient has a comminuted superior endplate compression fracture of L1. Vertebral body height loss is estimated at 30%.  There is a Schmorl's node in the inferior endplate of L1 which is incidentally noted.  A bony fragment measuring approximately 0.9 cm AP by 1.6 cm transverse by 1.7 cm cranial- caudal centrally and eccentric to the left is retropulsed into the canal.  The fragment results in moderate to moderately severe central canal stenosis and marked narrowing of the left lateral recess.  The patient's fracture does not extend to the posterior elements.  No other fracture is identified with vertebral body height otherwise maintained.  There is some disc bulging that is L3-4 and L4-5 causing mild central canal narrowing.  Foramina appear open.  IMPRESSION: Superior endplate compression fracture deformity of L1 with bony  retropulsion into the central canal causing moderate to moderately severe central canal narrowing and marked narrowing of the left lateral recess.  Original Report Authenticated By: Bernadene Bell. Maricela Curet, M.D.   Dg Pelvis Portable  04/14/2011  *RADIOLOGY REPORT*  Clinical Data: Pain after fall from ladder.  PORTABLE PELVIS  Comparison: None.  Findings: Both femoral heads are located. Sacroiliac joints are symmetric.  No acute fracture identified.  IMPRESSION: No acute findings.  Original Report  Authenticated By: Consuello Bossier, M.D.   Dg Chest Port 1 View  04/14/2011  *RADIOLOGY REPORT*  Clinical Data: Pain after fall from ladder.  PORTABLE CHEST - 1 VIEW  Comparison: None.  Findings: 2 frontal views. Midline trachea.  Normal heart size.  No pleural effusion or pneumothorax.  There is biapical pleural parenchymal scarring.  Lungs otherwise clear. No free intraperitoneal air.  IMPRESSION: No acute or post-traumatic deformity within the chest.  Original Report Authenticated By: Consuello Bossier, M.D.   Dg Knee Complete 4 Views Left  04/14/2011  *RADIOLOGY REPORT*  Clinical Data: Fall from ladder with left lower extremity injuries.  LEFT KNEE - COMPLETE 4+ VIEW  Comparison: Ankle film earlier today.  Findings: There is high fibular fracture with mildly displaced and oblique diaphyseal fracture present.  Alignment of the knee is normal.  No joint effusion.  IMPRESSION: Mildly displaced proximal fibular fracture.  Original Report Authenticated By: Reola Calkins, M.D.   Dg Hand Complete Left  04/14/2011  *RADIOLOGY REPORT*  Clinical Data: Fall.  Laceration.  LEFT HAND - COMPLETE 3+ VIEW  Comparison: None.  Findings: Portable examination without evidence of fracture or dislocation.  If there  is persistent finger discomfort, cone down view of the specific fingers can be obtained for further delineation with finger spread apart on the lateral view.  IMPRESSION: No fracture or dislocation.  See above.  Original Report Authenticated By: Fuller Canada, M.D.   Dg C-arm Gt 120 Min  04/14/2011  CLINICAL DATA: fixation   C-ARM GT 120 MIN  Fluoroscopy was utilized by the requesting physician.  No radiographic  interpretation.      Anti-infectives: Anti-infectives     Start     Dose/Rate Route Frequency Ordered Stop   04/14/11 2330   ceFAZolin (ANCEF) IVPB 1 g/50 mL premix        1 g 100 mL/hr over 30 Minutes Intravenous 3 times per day 04/14/11 2255 04/15/11 0651   04/14/11 1930   bacitracin  50,000 Units in sodium chloride irrigation 0.9 % 500 mL irrigation  Status:  Discontinued          As needed 04/14/11 2015 04/14/11 2154          Assessment/Plan: s/p Procedure(s): EXTERNAL FIXATION LEG POSTERIOR LUMBAR FUSION 3 LEVEL IRRIGATION AND DEBRIDEMENT EXTREMITY Continue foley due to Continue foley due to strict I&O and Continue foley due to  patient in ICU Will transfer to the floor if okay wih NS. Decrease IVFs a bit Okay to be OOB per Dr. Danielle Dess with PT/OT.  Will need to be NWB on LLE   LOS: 1 day   Marta Lamas. Gae Bon, MD, FACS [phone] Trauma Surgeon 04/15/2011

## 2011-04-15 NOTE — Progress Notes (Signed)
Subjective: Patient reports notes back pain and lef leg pain and nausea  Objective: Vital signs in last 24 hours: Temp:  [97.8 F (36.6 C)-99.1 F (37.3 C)] 98.9 F (37.2 C) (11/21 0800) Pulse Rate:  [72-92] 78  (11/21 0700) Resp:  [5-20] 16  (11/21 0800) BP: (114-151)/(67-85) 121/67 mmHg (11/21 0700) SpO2:  [90 %-100 %] 100 % (11/21 0800) Weight:  [73.483 kg (162 lb)] 162 lb (73.483 kg) (11/20 1022)  Intake/Output from previous day: 11/20 0701 - 11/21 0700 In: 4089.3 [I.V.:3789.3; Blood:250] Out: 1725 [Urine:1075; Blood:650] Intake/Output this shift: Total I/O In: 79.3 [I.V.:79.3] Out: 45 [Urine:45]  moves rle well no numbness reported. lle wiggles toes proximal movement noted. Dressing dry  Lab Results:  Basename 04/15/11 0600 04/14/11 1041  WBC 11.1* 11.8*  HGB 12.7* 15.3  HCT 37.0* 44.2  PLT 161 216   BMET  Basename 04/15/11 0600 04/14/11 1041  NA 137 137  K 4.3 3.8  CL 107 104  CO2 24 24  GLUCOSE 126* 132*  BUN 16 15  CREATININE 1.00 1.12  CALCIUM 7.6* 8.3*    Studies/Results: Dg Thoracolumabar Spine  04/14/2011  *RADIOLOGY REPORT*  Clinical Data: L1 burst fracture.  T12-L2 fusion.  OPERATIVE THORACOLUMBAR SPINE - 2 VIEW 04/14/2011:  Comparison: CT lumbar spine earlier same date.  Findings: 3 spot images from the C-arm fluoroscopic device were submitted for interpretation post-operatively.  Initial image demonstrated a localizer device posterior to the fractured L1 vertebral body.  Second and third images demonstrate posterior fusion with hardware from T12-L2 without visible complication.  IMPRESSION: T12-L2 fusion to stabilize the L1 burst fracture.  Original Report Authenticated By: Arnell Sieving, M.D.   Dg Tibia/fibula Left  04/14/2011  *RADIOLOGY REPORT*  Clinical Data: Severely comminuted distal tibia fracture. Placement of external fixator.  OPERATIVE LEFT TIBIA AND FIBULA - 2 VIEW 04/14/2011:  Comparison: Left ankle x-rays earlier same date.   Findings: 5 spot images from the C-arm fluoroscopic device were submitted for interpretation postoperatively.  External fixator screws are present within the mid shaft of the tibia.  A portion of the fixator device was placed into the posterior calcaneus. Severely comminuted distal tibia fracture again noted.  IMPRESSION: Images obtained during placement of external fixator into the left tibia and calcaneus.  Original Report Authenticated By: Arnell Sieving, M.D.   Dg Ankle 2 Views Left  04/14/2011  *RADIOLOGY REPORT*  Clinical Data: Larey Seat from 12 feet height.  Severe ankle pain.  LEFT ANKLE - 2 VIEW  Comparison: None.  Findings: Highly comminuted fracture of the distal tibial metaphysis is seen with intra-articular extension.  No definite the distal fibular fracture identified on this two-view exam.  The talus remains centered within the ankle mortise.  IMPRESSION: Highly comminuted fracture of the distal tibial metaphysis with intra-articular extension.  Original Report Authenticated By: Danae Orleans, M.D.   Ct Head Wo Contrast  04/14/2011  *RADIOLOGY REPORT*  Clinical Data:  Fall  CT HEAD WITHOUT CONTRAST CT CERVICAL SPINE WITHOUT CONTRAST  Technique:  Multidetector CT imaging of the head and cervical spine was performed following the standard protocol without intravenous contrast.  Multiplanar CT image reconstructions of the cervical spine were also generated.  Comparison:  Site of Service  CT HEAD  Findings: The ventricles are normal in size, shape, and position. There is no mass effect or midline shift.  No acute hemorrhage or abnormal extra-axial fluid collections are identified.  The gray/white differentiation is normal.  There is no evidence  of skull fracture.  The orbits have a normal appearance.  The visualized paranasal sinuses are clear well-aerated except for small mucous retention cyst in the left maxillary sinus.  IMPRESSION: There is no evidence of acute intracranial abnormality.  CT  CERVICAL SPINE  Findings: The odontoid is intact and the lateral masses are well- aligned.  The AP and lateral cervical alignment are normal.  The prevertebral soft tissue stripe is within normal limits.  There is no evidence of cervical spine fracture or dislocation.  A central disc herniation is noted posteriorly at C3-C4.  IMPRESSION: There is a central disc herniation at C3-C4.  There is no evidence of cervical spine fracture or dislocation.  Original Report Authenticated By: Brandon Melnick, M.D.   Ct Cervical Spine Wo Contrast  04/14/2011  *RADIOLOGY REPORT*  Clinical Data:  Fall  CT HEAD WITHOUT CONTRAST CT CERVICAL SPINE WITHOUT CONTRAST  Technique:  Multidetector CT imaging of the head and cervical spine was performed following the standard protocol without intravenous contrast.  Multiplanar CT image reconstructions of the cervical spine were also generated.  Comparison:  Site of Service  CT HEAD  Findings: The ventricles are normal in size, shape, and position. There is no mass effect or midline shift.  No acute hemorrhage or abnormal extra-axial fluid collections are identified.  The gray/white differentiation is normal.  There is no evidence of skull fracture.  The orbits have a normal appearance.  The visualized paranasal sinuses are clear well-aerated except for small mucous retention cyst in the left maxillary sinus.  IMPRESSION: There is no evidence of acute intracranial abnormality.  CT CERVICAL SPINE  Findings: The odontoid is intact and the lateral masses are well- aligned.  The AP and lateral cervical alignment are normal.  The prevertebral soft tissue stripe is within normal limits.  There is no evidence of cervical spine fracture or dislocation.  A central disc herniation is noted posteriorly at C3-C4.  IMPRESSION: There is a central disc herniation at C3-C4.  There is no evidence of cervical spine fracture or dislocation.  Original Report Authenticated By: Brandon Melnick, M.D.   Ct Lumbar  Spine Wo Contrast  04/14/2011  *RADIOLOGY REPORT*  Clinical Data: Status post fall.  Back pain.  CT LUMBAR SPINE WITHOUT CONTRAST  Technique:  Multidetector CT imaging of the lumbar spine was performed without intravenous contrast administration. Multiplanar CT image reconstructions were also generated.  Comparison: None.  Findings: The patient has a comminuted superior endplate compression fracture of L1. Vertebral body height loss is estimated at 30%.  There is a Schmorl's node in the inferior endplate of L1 which is incidentally noted.  A bony fragment measuring approximately 0.9 cm AP by 1.6 cm transverse by 1.7 cm cranial- caudal centrally and eccentric to the left is retropulsed into the canal.  The fragment results in moderate to moderately severe central canal stenosis and marked narrowing of the left lateral recess.  The patient's fracture does not extend to the posterior elements.  No other fracture is identified with vertebral body height otherwise maintained.  There is some disc bulging that is L3-4 and L4-5 causing mild central canal narrowing.  Foramina appear open.  IMPRESSION: Superior endplate compression fracture deformity of L1 with bony retropulsion into the central canal causing moderate to moderately severe central canal narrowing and marked narrowing of the left lateral recess.  Original Report Authenticated By: Bernadene Bell. Maricela Curet, M.D.   Dg Pelvis Portable  04/14/2011  *RADIOLOGY REPORT*  Clinical Data:  Pain after fall from ladder.  PORTABLE PELVIS  Comparison: None.  Findings: Both femoral heads are located. Sacroiliac joints are symmetric.  No acute fracture identified.  IMPRESSION: No acute findings.  Original Report Authenticated By: Consuello Bossier, M.D.   Dg Chest Port 1 View  04/14/2011  *RADIOLOGY REPORT*  Clinical Data: Pain after fall from ladder.  PORTABLE CHEST - 1 VIEW  Comparison: None.  Findings: 2 frontal views. Midline trachea.  Normal heart size.  No pleural  effusion or pneumothorax.  There is biapical pleural parenchymal scarring.  Lungs otherwise clear. No free intraperitoneal air.  IMPRESSION: No acute or post-traumatic deformity within the chest.  Original Report Authenticated By: Consuello Bossier, M.D.   Dg Knee Complete 4 Views Left  04/14/2011  *RADIOLOGY REPORT*  Clinical Data: Fall from ladder with left lower extremity injuries.  LEFT KNEE - COMPLETE 4+ VIEW  Comparison: Ankle film earlier today.  Findings: There is high fibular fracture with mildly displaced and oblique diaphyseal fracture present.  Alignment of the knee is normal.  No joint effusion.  IMPRESSION: Mildly displaced proximal fibular fracture.  Original Report Authenticated By: Reola Calkins, M.D.   Dg Hand Complete Left  04/14/2011  *RADIOLOGY REPORT*  Clinical Data: Fall.  Laceration.  LEFT HAND - COMPLETE 3+ VIEW  Comparison: None.  Findings: Portable examination without evidence of fracture or dislocation.  If there  is persistent finger discomfort, cone down view of the specific fingers can be obtained for further delineation with finger spread apart on the lateral view.  IMPRESSION: No fracture or dislocation.  See above.  Original Report Authenticated By: Fuller Canada, M.D.   Dg C-arm Gt 120 Min  04/14/2011  CLINICAL DATA: fixation   C-ARM GT 120 MIN  Fluoroscopy was utilized by the requesting physician.  No radiographic  interpretation.      Assessment/Plan: Doing well POD 1.   LOS: 1 day  ok to mobilize. Has PT/OT consult. OOB with Brace.if allowed by orthopaedics.   Mella Inclan J 04/15/2011, 8:48 AM

## 2011-04-15 NOTE — Progress Notes (Signed)
Subjective: POD s/p ex fix of L LE and closure of L ring finger laceration.  No c/o ankle pain.  Denies pain in hand.  Objective: Vital signs in last 24 hours: Temp:  [97.8 F (36.6 C)-99.4 F (37.4 C)] 99.4 F (37.4 C) (11/21 1200) Pulse Rate:  [72-92] 85  (11/21 1200) Resp:  [5-20] 16  (11/21 1200) BP: (114-151)/(66-85) 143/77 mmHg (11/21 1200) SpO2:  [90 %-100 %] 100 % (11/21 1200)  Intake/Output from previous day: 11/20 0701 - 11/21 0700 In: 4089.3 [I.V.:3789.3; Blood:250] Out: 1725 [Urine:1075; Blood:650] Intake/Output this shift: Total I/O In: 1039.3 [P.O.:960; I.V.:79.3] Out: 375 [Urine:375]   Basename 04/15/11 0600 04/14/11 1041  HGB 12.7* 15.3    Basename 04/15/11 0600 04/14/11 1041  WBC 11.1* 11.8*  RBC 3.98* 4.79  HCT 37.0* 44.2  PLT 161 216    Basename 04/15/11 0600 04/14/11 1041  NA 137 137  K 4.3 3.8  CL 107 104  CO2 24 24  BUN 16 15  CREATININE 1.00 1.12  GLUCOSE 126* 132*  CALCIUM 7.6* 8.3*    LLE with ex fix in place.  Serosang drainage from pin at lat heel.  Feels LT in med and lat plantar n dist.  L hand dressed and dry.  Sens intact at both digital nerves in ring finger.  Assessment/Plan: 1.  L tibial pilon fx.  -  Fine cut CT of L ankle.  Continue NWB LLE.  Begin pin care. 2.  L ring finger lac.  -  Continue dressing for another day.  Then remove and dress with dry gauze wrapped around digit.  OK to use hand for transfers and ambulation.   Norman Pearson 04/15/2011, 12:43 PM

## 2011-04-16 MED ORDER — CYCLOBENZAPRINE HCL 10 MG PO TABS
10.0000 mg | ORAL_TABLET | Freq: Three times a day (TID) | ORAL | Status: DC | PRN
  Administered 2011-04-17 – 2011-04-25 (×10): 10 mg via ORAL
  Filled 2011-04-16 (×13): qty 1

## 2011-04-16 MED ORDER — BACITRACIN ZINC 500 UNIT/GM EX OINT
TOPICAL_OINTMENT | Freq: Two times a day (BID) | CUTANEOUS | Status: DC
  Administered 2011-04-16 – 2011-04-22 (×10): via TOPICAL
  Filled 2011-04-16 (×2): qty 15

## 2011-04-16 NOTE — Progress Notes (Signed)
2 Days Post-Op  Subjective: Pt uncomfortable secondary to brace and fixator.  Tolerating po.  Requesting the foley remain  Objective: Vital signs in last 24 hours: Temp:  [98.6 F (37 C)-99.4 F (37.4 C)] 99 F (37.2 C) (11/22 0532) Pulse Rate:  [80-89] 82  (11/22 0532) Resp:  [15-22] 22  (11/22 0532) BP: (117-143)/(68-81) 117/72 mmHg (11/22 0532) SpO2:  [68 %-100 %] 68 % (11/22 0532) Last BM Date:  (prior to arrival.)  Intake/Output from previous day: 11/21 0701 - 11/22 0700 In: 1039.3 [P.O.:960; I.V.:79.3] Out: 1225 [Urine:1225] Intake/Output this shift:    Resp: clear to auscultation bilaterally GI: soft, non-tender; bowel sounds normal; no masses,  no organomegaly  Lab Results:   Basename 04/15/11 0600 04/14/11 1041  WBC 11.1* 11.8*  HGB 12.7* 15.3  HCT 37.0* 44.2  PLT 161 216   BMET  Basename 04/15/11 0600 04/14/11 1041  NA 137 137  K 4.3 3.8  CL 107 104  CO2 24 24  GLUCOSE 126* 132*  BUN 16 15  CREATININE 1.00 1.12  CALCIUM 7.6* 8.3*   PT/INR No results found for this basename: LABPROT:2,INR:2 in the last 72 hours ABG No results found for this basename: PHART:2,PCO2:2,PO2:2,HCO3:2 in the last 72 hours  Studies/Results: Dg Thoracolumabar Spine  04/14/2011  *RADIOLOGY REPORT*  Clinical Data: L1 burst fracture.  T12-L2 fusion.  OPERATIVE THORACOLUMBAR SPINE - 2 VIEW 04/14/2011:  Comparison: CT lumbar spine earlier same date.  Findings: 3 spot images from the C-arm fluoroscopic device were submitted for interpretation post-operatively.  Initial image demonstrated Pearson localizer device posterior to the fractured L1 vertebral body.  Second and third images demonstrate posterior fusion with hardware from T12-L2 without visible complication.  IMPRESSION: T12-L2 fusion to stabilize the L1 burst fracture.  Original Report Authenticated By: Arnell Sieving, M.D.   Dg Tibia/fibula Left  04/14/2011  *RADIOLOGY REPORT*  Clinical Data: Severely comminuted distal tibia  fracture. Placement of external fixator.  OPERATIVE LEFT TIBIA AND FIBULA - 2 VIEW 04/14/2011:  Comparison: Left ankle x-rays earlier same date.  Findings: 5 spot images from the C-arm fluoroscopic device were submitted for interpretation postoperatively.  External fixator screws are present within the mid shaft of the tibia.  Pearson portion of the fixator device was placed into the posterior calcaneus. Severely comminuted distal tibia fracture again noted.  IMPRESSION: Images obtained during placement of external fixator into the left tibia and calcaneus.  Original Report Authenticated By: Arnell Sieving, M.D.   Dg Ankle 2 Views Left  04/14/2011  *RADIOLOGY REPORT*  Clinical Data: Larey Seat from 12 feet height.  Severe ankle pain.  LEFT ANKLE - 2 VIEW  Comparison: None.  Findings: Highly comminuted fracture of the distal tibial metaphysis is seen with intra-articular extension.  No definite the distal fibular fracture identified on this two-view exam.  The talus remains centered within the ankle mortise.  IMPRESSION: Highly comminuted fracture of the distal tibial metaphysis with intra-articular extension.  Original Report Authenticated By: Danae Orleans, M.D.   Ct Head Wo Contrast  04/14/2011  *RADIOLOGY REPORT*  Clinical Data:  Fall  CT HEAD WITHOUT CONTRAST CT CERVICAL SPINE WITHOUT CONTRAST  Technique:  Multidetector CT imaging of the head and cervical spine was performed following the standard protocol without intravenous contrast.  Multiplanar CT image reconstructions of the cervical spine were also generated.  Comparison:  Site of Service  CT HEAD  Findings: The ventricles are normal in size, shape, and position. There is no mass effect  or midline shift.  No acute hemorrhage or abnormal extra-axial fluid collections are identified.  The gray/white differentiation is normal.  There is no evidence of skull fracture.  The orbits have Pearson normal appearance.  The visualized paranasal sinuses are clear  well-aerated except for small mucous retention cyst in the left maxillary sinus.  IMPRESSION: There is no evidence of acute intracranial abnormality.  CT CERVICAL SPINE  Findings: The odontoid is intact and the lateral masses are well- aligned.  The AP and lateral cervical alignment are normal.  The prevertebral soft tissue stripe is within normal limits.  There is no evidence of cervical spine fracture or dislocation.  Pearson central disc herniation is noted posteriorly at C3-C4.  IMPRESSION: There is Pearson central disc herniation at C3-C4.  There is no evidence of cervical spine fracture or dislocation.  Original Report Authenticated By: Brandon Melnick, M.D.   Ct Cervical Spine Wo Contrast  04/14/2011  *RADIOLOGY REPORT*  Clinical Data:  Fall  CT HEAD WITHOUT CONTRAST CT CERVICAL SPINE WITHOUT CONTRAST  Technique:  Multidetector CT imaging of the head and cervical spine was performed following the standard protocol without intravenous contrast.  Multiplanar CT image reconstructions of the cervical spine were also generated.  Comparison:  Site of Service  CT HEAD  Findings: The ventricles are normal in size, shape, and position. There is no mass effect or midline shift.  No acute hemorrhage or abnormal extra-axial fluid collections are identified.  The gray/white differentiation is normal.  There is no evidence of skull fracture.  The orbits have Pearson normal appearance.  The visualized paranasal sinuses are clear well-aerated except for small mucous retention cyst in the left maxillary sinus.  IMPRESSION: There is no evidence of acute intracranial abnormality.  CT CERVICAL SPINE  Findings: The odontoid is intact and the lateral masses are well- aligned.  The AP and lateral cervical alignment are normal.  The prevertebral soft tissue stripe is within normal limits.  There is no evidence of cervical spine fracture or dislocation.  Pearson central disc herniation is noted posteriorly at C3-C4.  IMPRESSION: There is Pearson central disc  herniation at C3-C4.  There is no evidence of cervical spine fracture or dislocation.  Original Report Authenticated By: Brandon Melnick, M.D.   Ct Lumbar Spine Wo Contrast  04/14/2011  *RADIOLOGY REPORT*  Clinical Data: Status post fall.  Back pain.  CT LUMBAR SPINE WITHOUT CONTRAST  Technique:  Multidetector CT imaging of the lumbar spine was performed without intravenous contrast administration. Multiplanar CT image reconstructions were also generated.  Comparison: None.  Findings: The patient has Pearson comminuted superior endplate compression fracture of L1. Vertebral body height loss is estimated at 30%.  There is Pearson Schmorl's node in the inferior endplate of L1 which is incidentally noted.  Pearson bony fragment measuring approximately 0.9 cm AP by 1.6 cm transverse by 1.7 cm cranial- caudal centrally and eccentric to the left is retropulsed into the canal.  The fragment results in moderate to moderately severe central canal stenosis and marked narrowing of the left lateral recess.  The patient's fracture does not extend to the posterior elements.  No other fracture is identified with vertebral body height otherwise maintained.  There is some disc bulging that is L3-4 and L4-5 causing mild central canal narrowing.  Foramina appear open.  IMPRESSION: Superior endplate compression fracture deformity of L1 with bony retropulsion into the central canal causing moderate to moderately severe central canal narrowing and marked narrowing of the  left lateral recess.  Original Report Authenticated By: Bernadene Bell. Maricela Curet, M.D.   Dg Pelvis Portable  04/14/2011  *RADIOLOGY REPORT*  Clinical Data: Pain after fall from ladder.  PORTABLE PELVIS  Comparison: None.  Findings: Both femoral heads are located. Sacroiliac joints are symmetric.  No acute fracture identified.  IMPRESSION: No acute findings.  Original Report Authenticated By: Consuello Bossier, M.D.   Ct Ankle Left Wo Contrast  04/15/2011  *RADIOLOGY REPORT*  Clinical  Data: Ankle fracture.  External fixator in place.  CT OF THE LEFT ANKLE WITHOUT CONTRAST  Technique:  Multidetector CT imaging was performed according to the standard protocol. Multiplanar CT image reconstructions were also generated.  Comparison: Plain films 04/14/2011.  Findings: External fixator is noted.  The patient has Pearson highly comminuted fracture of the distal tibia extending to the tibial plafond.  The plafond is divided into four main fragments.  The patient's fracture originates in the posterior diaphysis approximately 10 cm above the tibiotalar joint.  The posterior malleolus is included is Pearson separate fragment which shows mild distraction of 0.3-0.4 cm.  The anterior lip of the tibia, including Pearson portion of the articular surface measuring approximately 3.3 cm transverse by 1.5 cm AP, is rotated anteriorly with the anterior cortex oriented approximately 45 degrees posteriorly.  Pearson large fracture fragment which includes the medial malleolus is nondisplaced.  Central component of the fracture measuring approximately 1.5 cm transverse by 1.3 cm Pearson P is impacted 0.5 cm.  Visualized fibula is intact.  Soft tissue structures demonstrate passage of the tibialis posterior tendon between two large fracture fragments.  The tendon appears entrapped beginning 4.5 cm above the tibiotalar joint.  The flexor hallicus longus, flexor digitorum longus, Achilles, peroneal and anterior tendons are all unremarkable.  Extensive hematoma formation and swelling is present about the ankle.  IMPRESSION:  1.  Highly comminuted Pilon fracture as described above. 2.  Findings consistent with entrapment of the tibialis posterior tendon beginning approximately 4.5 cm above the tibiotalar joint.  Original Report Authenticated By: Bernadene Bell. Maricela Curet, M.D.   Dg Chest Port 1 View  04/14/2011  *RADIOLOGY REPORT*  Clinical Data: Pain after fall from ladder.  PORTABLE CHEST - 1 VIEW  Comparison: None.  Findings: 2 frontal views. Midline  trachea.  Normal heart size.  No pleural effusion or pneumothorax.  There is biapical pleural parenchymal scarring.  Lungs otherwise clear. No free intraperitoneal air.  IMPRESSION: No acute or post-traumatic deformity within the chest.  Original Report Authenticated By: Consuello Bossier, M.D.   Dg Knee Complete 4 Views Left  04/14/2011  *RADIOLOGY REPORT*  Clinical Data: Fall from ladder with left lower extremity injuries.  LEFT KNEE - COMPLETE 4+ VIEW  Comparison: Ankle film earlier today.  Findings: There is high fibular fracture with mildly displaced and oblique diaphyseal fracture present.  Alignment of the knee is normal.  No joint effusion.  IMPRESSION: Mildly displaced proximal fibular fracture.  Original Report Authenticated By: Reola Calkins, M.D.   Dg Hand Complete Left  04/14/2011  *RADIOLOGY REPORT*  Clinical Data: Fall.  Laceration.  LEFT HAND - COMPLETE 3+ VIEW  Comparison: None.  Findings: Portable examination without evidence of fracture or dislocation.  If there  is persistent finger discomfort, cone down view of the specific fingers can be obtained for further delineation with finger spread apart on the lateral view.  IMPRESSION: No fracture or dislocation.  See above.  Original Report Authenticated By: Fuller Canada, M.D.  Dg C-arm Gt 120 Min  04/14/2011  CLINICAL DATA: fixation   C-ARM GT 120 MIN  Fluoroscopy was utilized by the requesting physician.  No radiographic  interpretation.      Anti-infectives: Anti-infectives     Start     Dose/Rate Route Frequency Ordered Stop   04/14/11 2330   ceFAZolin (ANCEF) IVPB 1 g/50 mL premix        1 g 100 mL/hr over 30 Minutes Intravenous 3 times per day 04/14/11 2255 04/15/11 0651   04/14/11 1930   bacitracin 50,000 Units in sodium chloride irrigation 0.9 % 500 mL irrigation  Status:  Discontinued          As needed 04/14/11 2015 04/14/11 2154          Assessment/Plan: s/p Procedure(s): EXTERNAL FIXATION LEG POSTERIOR  LUMBAR FUSION 3 LEVEL IRRIGATION AND DEBRIDEMENT EXTREMITY Continue current care Leave foley per patient request  LOS: 2 days    Norman Pearson 04/16/2011

## 2011-04-16 NOTE — Progress Notes (Signed)
Subjective: 2 Days Post-Op Procedure(s) (LRB): EXTERNAL FIXATION LEG (Left) POSTERIOR LUMBAR FUSION 3 LEVEL () IRRIGATION AND DEBRIDEMENT EXTREMITY (Left) Patient reports pain as 7 on 0-10 scale.    Objective: Vital signs in last 24 hours: Temp:  [98.4 F (36.9 C)-99.4 F (37.4 C)] 98.4 F (36.9 C) (11/22 1023) Pulse Rate:  [80-89] 80  (11/22 1023) Resp:  [15-22] 20  (11/22 1023) BP: (113-143)/(57-81) 113/57 mmHg (11/22 1023) SpO2:  [68 %-100 %] 94 % (11/22 1023)  Intake/Output from previous day: 11/21 0701 - 11/22 0700 In: 1039.3 [P.O.:960; I.V.:79.3] Out: 1225 [Urine:1225] Intake/Output this shift:     Basename 04/15/11 0600 04/14/11 1041  HGB 12.7* 15.3    Basename 04/15/11 0600 04/14/11 1041  WBC 11.1* 11.8*  RBC 3.98* 4.79  HCT 37.0* 44.2  PLT 161 216    Basename 04/15/11 0600 04/14/11 1041  NA 137 137  K 4.3 3.8  CL 107 104  CO2 24 24  BUN 16 15  CREATININE 1.00 1.12  GLUCOSE 126* 132*  CALCIUM 7.6* 8.3*   No results found for this basename: LABPT:2,INR:2 in the last 72 hours    Assessment/Plan: 2 Days Post-Op Procedure(s) (LRB): EXTERNAL FIXATION LEG (Left) POSTERIOR LUMBAR FUSION 3 LEVEL () IRRIGATION AND DEBRIDEMENT EXTREMITY (Left)   Tamberlyn Midgley P 04/16/2011, 11:38 AM Alert.NV intact lt foot.

## 2011-04-16 NOTE — Progress Notes (Signed)
Occupational Therapy Evaluation Patient Details Name: Norman Pearson MRN: [id number] DOB: October 30, 1950 Today's Date: 04/16/2011  Problem List:  Patient Active Problem List  Diagnoses  . Unstable burst fracture of first lumbar vertebra  . Accidental fall from ladder  . Closed fracture of left fibula  . Laceration of fourth finger of left hand  . Closed fracture of left distal tibia  . Anemia associated with acute blood loss  . Hypocalcemia    Past Medical History: History reviewed. No pertinent past medical history. Past Surgical History:  Past Surgical History  Procedure Date  . Other surgical history     tendon surgery  . External fixation leg 04/14/2011    Procedure: EXTERNAL FIXATION LEG;  Surgeon: [name], MD;  Location: [facility] OR;  Service: Orthopedics;  Laterality: Left;  External fixation left lower leg  . I&d extremity 04/14/2011    Procedure: IRRIGATION AND DEBRIDEMENT EXTREMITY;  Surgeon: [name], MD;  Location: [facility] OR;  Service: Orthopedics;  Laterality: Left;  Repair of left hand laceration    OT Assessment/Plan/Recommendation OT Assessment Clinical Impression Statement: Pt is s/p fall from ladder with LLE fractures and L1 burst fx requiring spinal stabilization surgery and external fixation of LLE. Patient will benefit from skilled OT in the acute care setting followed by inpatient rehab to maximize independence with ADL and ADL mobility and decrease caregiver burden upon d/c home. OT Recommendation/Assessment: Patient will need skilled OT in the acute care venue OT Problem List: Decreased activity tolerance;Decreased knowledge of use of DME or AE;Decreased knowledge of precautions;Pain Barriers to Discharge Comments: wife has MS and therefore will be unable to provide extensive amount of physical assist OT Therapy Diagnosis : Acute pain OT Plan OT Frequency: Min 2X/week OT Treatment/Interventions: Self-care/ADL training;Therapeutic exercise;DME and/or AE  instruction;Patient/family education;Therapeutic activities OT Recommendation Recommendations for Other Services: Rehab consult Follow Up Recommendations: Inpatient Rehab Equipment Recommended: Rolling walker with 5" wheels;3 in 1 bedside comode Individuals Consulted Consulted and Agree with Results and Recommendations: Patient;Family member/caregiver Family Member Consulted: daughter OT Goals Acute Rehab OT Goals OT Goal Formulation: With patient Time For Goal Achievement: 2 weeks ADL Goals Pt Will Transfer to Toilet: 3-in-1;Ambulation;with DME;Maintaining weight bearing status (Min guard A) ADL Goal: Toilet Transfer - Progress: Other (comment) Pt Will Perform Toileting - Clothing Manipulation: with supervision;Standing ADL Goal: Toileting - Clothing Manipulation - Progress: Other (comment) Pt Will Perform Toileting - Hygiene: Sit to stand from 3-in-1/toilet (Min guard A) ADL Goal: Toileting - Hygiene - Progress: Other (comment)  OT Evaluation Precautions/Restrictions  Precautions Precautions: Back Precaution Booklet Issued: No Required Braces or Orthoses: Yes Spinal Brace: Thoracolumbosacral orthotic (applied in supine) Restrictions Weight Bearing Restrictions: Yes LLE Weight Bearing: Non weight bearing (LLE) Other Position/Activity Restrictions: external fixator on LLE ADL ADL Grooming: Simulated;Wash/dry face;Set up Where Assessed - Grooming: Sitting, chair Toilet Transfer: Simulated;+2 Total assistance Toilet Transfer Details (indicate cue type and reason): pt=75% Patient did well maintaining NWB on LLE during stand-pivot with RW from EOB to chair Toilet Transfer Method: Stand pivot Vision/Perception  Vision - History Patient Visual Report: No change from baseline Cognition Cognition Arousal/Alertness: Awake/alert Overall Cognitive Status: Appears within functional limits for tasks assessed Orientation Level: Oriented X4 Sensation/Coordination Sensation Light  Touch: Appears Intact (unable to test Lt hand secondary to dressing) Extremity Assessment RUE Assessment RUE Assessment: Within Functional Limits LUE Assessment LUE Assessment: Within Functional Limits (L hand with dsg; per ortho MD able to use for amb & transfer) Mobility  Bed Mobility Bed  Mobility: Yes Rolling Right: 3: Mod assist Rolling Right Details (indicate cue type and reason): cues for hand and leg placement and assist for LLE Rolling Left: 3: Mod assist Rolling Left Details (indicate cue type and reason): cues for hand and leg placement and assist for LLE Left Sidelying to Sit: 3: Mod assist;HOB flat;With rails Left Sidelying to Sit Details (indicate cue type and reason): cues for hand placement and sequencing and assist for LLE Transfers Sit to Stand: 3: Mod assist;From elevated surface;From bed Sit to Stand Details (indicate cue type and reason): manual facilitation to reach upright, vc for sequencing/technique Stand to Sit: 3: Mod assist;To chair/3-in-1;To elevated surface Stand to Sit Details: built recliner up with pillow End of Session OT - End of Session Equipment Utilized During Treatment: Gait belt;Back brace (RW) Activity Tolerance: Patient tolerated treatment well Patient left: in chair;with call bell in reach;with family/visitor present Nurse Communication: Mobility status for transfers;Mobility status for ambulation General Behavior During Session: [facility] for tasks performed Cognition: [facility] for tasks performed   Norman Pearson 04/16/2011, 11:40 AM

## 2011-04-16 NOTE — Progress Notes (Signed)
Subjective: Patient reports That his pain is well controlled he has no leg pain or numbness and tingling except was associated with a fracture of his left ankle.  Objective: Vital signs in last 24 hours: Temp:  [98.4 F (36.9 C)-99.1 F (37.3 C)] 99.1 F (37.3 C) (11/22 1750) Pulse Rate:  [80-88] 85  (11/22 1750) Resp:  [20-22] 20  (11/22 1750) BP: (113-122)/(57-76) 122/72 mmHg (11/22 1750) SpO2:  [68 %-96 %] 94 % (11/22 1750)  Intake/Output from previous day: 11/21 0701 - 11/22 0700 In: 1039.3 [P.O.:960; I.V.:79.3] Out: 1225 [Urine:1225] Intake/Output this shift:    Neurological he and his right lower extremity is 5 out of 5 proximally and distally out of 5 proximally and left lower extremity distal distally unable to assess. His wound is clean and dry  Lab Results:  Basename 04/15/11 0600 04/14/11 1041  WBC 11.1* 11.8*  HGB 12.7* 15.3  HCT 37.0* 44.2  PLT 161 216   BMET  Basename 04/15/11 0600 04/14/11 1041  NA 137 137  K 4.3 3.8  CL 107 104  CO2 24 24  GLUCOSE 126* 132*  BUN 16 15  CREATININE 1.00 1.12  CALCIUM 7.6* 8.3*    Studies/Results: Ct Ankle Left Wo Contrast  04/15/2011  *RADIOLOGY REPORT*  Clinical Data: Ankle fracture.  External fixator in place.  CT OF THE LEFT ANKLE WITHOUT CONTRAST  Technique:  Multidetector CT imaging was performed according to the standard protocol. Multiplanar CT image reconstructions were also generated.  Comparison: Plain films 04/14/2011.  Findings: External fixator is noted.  The patient has a highly comminuted fracture of the distal tibia extending to the tibial plafond.  The plafond is divided into four main fragments.  The patient's fracture originates in the posterior diaphysis approximately 10 cm above the tibiotalar joint.  The posterior malleolus is included is a separate fragment which shows mild distraction of 0.3-0.4 cm.  The anterior lip of the tibia, including a portion of the articular surface measuring approximately  3.3 cm transverse by 1.5 cm AP, is rotated anteriorly with the anterior cortex oriented approximately 45 degrees posteriorly.  A large fracture fragment which includes the medial malleolus is nondisplaced.  Central component of the fracture measuring approximately 1.5 cm transverse by 1.3 cm A P is impacted 0.5 cm.  Visualized fibula is intact.  Soft tissue structures demonstrate passage of the tibialis posterior tendon between two large fracture fragments.  The tendon appears entrapped beginning 4.5 cm above the tibiotalar joint.  The flexor hallicus longus, flexor digitorum longus, Achilles, peroneal and anterior tendons are all unremarkable.  Extensive hematoma formation and swelling is present about the ankle.  IMPRESSION:  1.  Highly comminuted Pilon fracture as described above. 2.  Findings consistent with entrapment of the tibialis posterior tendon beginning approximately 4.5 cm above the tibiotalar joint.  Original Report Authenticated By: Bernadene Bell. Maricela Curet, M.D.    Assessment/Plan: Mobilize with therapy as he is able to her his orthopedic limitations  LOS: 2 days     Khaleel Beckom P 04/16/2011, 10:24 PM

## 2011-04-16 NOTE — Progress Notes (Signed)
Pin care site was completed at 1630.  Per MD, dressing was completely removed, and bacitracin ointment was applied to all pin sites.  Pin sites were re-dressed with new dressings and then wrapped with an ace bandage.

## 2011-04-16 NOTE — Progress Notes (Signed)
Patient noted with increasing unmeasured amount of serosanguinous drainage on the left heel of operative site. Pressure dressing done and ice pack applied. Called PA [facility]) of Dr. Victorino Dike and said to continue pressure dressing and cold pack and inform her back for any changes. Will continue to monitor.

## 2011-04-16 NOTE — Progress Notes (Signed)
Physical Therapy Treatment Patient Details Name: Norman Pearson MRN: [id number] DOB: 11/11/1950 Today's Date: 04/16/2011  PT Assessment/Plan  PT - Assessment/Plan Comments on Treatment Session: patient better able to advance RLE and tolerate mobility. Patient remains motivated.  Patient still with difficulty manitaining NWB on LLE due to weight of leg.   PT Plan: Discharge plan remains appropriate PT Frequency: Min 5X/week Follow Up Recommendations: Inpatient Rehab;24 hour supervision/assistance Equipment Recommended: Defer to next venue PT Goals  Acute Rehab PT Goals PT Goal: Rolling Supine to Left Side - Progress: Progressing toward goal PT Goal: Sit to Supine/Side - Progress: Progressing toward goal PT Transfer Goal: Sit to Stand/Stand to Sit - Progress: Progressing toward goal PT Transfer Goal: Bed to Chair/Chair to Bed - Progress: Progressing toward goal PT Goal: Ambulate - Progress: Progressing toward goal  PT Treatment Precautions/Restrictions  Precautions Precautions: Back Precaution Booklet Issued: No Required Braces or Orthoses: Yes Spinal Brace: Thoracolumbosacral orthotic;Applied in supine position Restrictions Weight Bearing Restrictions: Yes LLE Weight Bearing: Non weight bearing Other Position/Activity Restrictions: external fixator on LLE Mobility (including Balance) Bed Mobility Bed Mobility: Yes Rolling Right: 4: Min assist;With rail Rolling Right Details (indicate cue type and reason): vc to avoid twisting and to assist with LLE Rolling Left: 4: Min assist;With rail Rolling Left Details (indicate cue type and reason): rolled to remove brace Sit to Supine - Right: 4: Min assist Sit to Supine - Right Details (indicate cue type and reason): required assist for LLE Transfers Sit to Stand: 4: Min assist;From chair/3-in-1;With upper extremity assist Sit to Stand Details (indicate cue type and reason): manual facilitation to reach upright, vc for  sequencing/technique Stand to Sit: 4: Min assist;To bed;To elevated surface;With upper extremity assist Stand to Sit Details: to control descent Ambulation/Gait Ambulation/Gait: Yes Ambulation/Gait Assistance: 1: +2 Total assist Ambulation/Gait Assistance Details (indicate cue type and reason): pt = 75%; assist for safety and to assist with LLE to maintain NWB.  more of a pivot to chair, but able to advance right lower extremity Ambulation Distance (Feet): 1 Feet Assistive device: Rolling walker  Posture/Postural Control Posture/Postural Control: No significant limitations Exercise    End of Session PT - End of Session Equipment Utilized During Treatment: Gait belt;Back brace Activity Tolerance: Patient tolerated treatment well;Patient limited by pain Patient left: in bed;with call bell in reach;with family/visitor present Nurse Communication: Mobility status for ambulation;Mobility status for transfers General Behavior During Session: [facility] for tasks performed Cognition: [facility] for tasks performed  Olivia Canter 04/16/2011, 11:15 AM 04/16/2011 Olivia Canter, PT [phone]

## 2011-04-17 MED ORDER — BISACODYL 10 MG RE SUPP: 10.0000 mg | Freq: Every day | RECTAL | Status: DC | PRN

## 2011-04-17 MED ORDER — BISACODYL 5 MG PO TBEC
10.0000 mg | DELAYED_RELEASE_TABLET | Freq: Every day | ORAL | Status: DC
  Administered 2011-04-17 – 2011-04-27 (×9): 10 mg via ORAL
  Filled 2011-04-17: qty 1
  Filled 2011-04-17: qty 2
  Filled 2011-04-17 (×2): qty 1
  Filled 2011-04-17 (×6): qty 2

## 2011-04-17 NOTE — Progress Notes (Signed)
Physical Therapy Treatment Patient Details Name: Norman Pearson MRN: [id number] DOB: [birth year] Today's Date: 04/17/2011  PT Assessment/Plan  PT - Assessment/Plan Comments on Treatment Session: Educated pt and daughter re: need to spend more time "vertical" during the day to decr dizziness when he gets up with therapy. Discussed with pt and RN OK to sit upright in bed with brace on (vs getting OOB to chair).  Discussed can leave brace on in bed for periods of time (if comfortable) to minimize donning and dofffing throughout the day. PT Plan: Discharge plan remains appropriate;Frequency remains appropriate PT Frequency: Min 5X/week Follow Up Recommendations: Inpatient Rehab;24 hour supervision/assistance Equipment Recommended: Defer to next venue PT Goals  Acute Rehab PT Goals PT Goal: Rolling Supine to Right Side - Progress: Progressing toward goal PT Goal: Rolling Supine to Left Side - Progress: Progressing toward goal PT Goal: Supine/Side to Sit - Progress: Progressing toward goal PT Transfer Goal: Sit to Stand/Stand to Sit - Progress: Progressing toward goal PT Transfer Goal: Bed to Chair/Chair to Bed - Progress: Progressing toward goal PT Goal: Ambulate - Progress: Progressing toward goal  PT Treatment Precautions/Restrictions  Precautions Precautions: Back Precaution Booklet Issued: No Required Braces or Orthoses: Yes Spinal Brace: Thoracolumbosacral orthotic;Applied in supine position Restrictions Weight Bearing Restrictions: Yes LLE Weight Bearing: Non weight bearing Other Position/Activity Restrictions: external fixator on LLE Mobility (including Balance) Bed Mobility Rolling Right: 3: Mod assist;With rail Rolling Right Details (indicate cue type and reason): vc for technique and use of rail; assist to LLE due to fixator Rolling Left: 4: Min assist;With rail Rolling Left Details (indicate cue type and reason): vc to maintain back precautions; assist to position  LLE/fixator Left Sidelying to Sit: 3: Mod assist;HOB flat;With rails Left Sidelying to Sit Details (indicate cue type and reason): assist to lower LLE over EOB and maintained NWB by having bed elevated towards ceiling (so feet did not reach the floor); assist then provided to raise torso as pt with difficulty getting LUE into position for pushing up Sitting - Scoot to Edge of Bed: 5: Supervision Transfers Sit to Stand: 3: Mod assist;From elevated surface;With upper extremity assist;From bed Sit to Stand Details (indicate cue type and reason): assist to maintain NWB LLE and to anchor RW as pt preferred one hand on RW and one hand pushing from bed (RUE) Stand to Sit: 3: Mod assist;To chair/3-in-1;With upper extremity assist Stand to Sit Details: vc for UE placement and assist to maintain NWB LLE Ambulation/Gait Ambulation/Gait Assistance: 4: Min assist Ambulation/Gait Assistance Details (indicate cue type and reason): pt with excellent unweighting of RLE via UEs on RW; limited by dizziness Ambulation Distance (Feet): 3 Feet Assistive device: Rolling walker Gait Pattern: Step-to pattern;Decreased step length - right    Exercise    End of Session PT - End of Session Equipment Utilized During Treatment: Gait belt;Back brace Activity Tolerance: Treatment limited secondary to medical complications (Comment) (likely orthostatic; limited by dizziness) Patient left: in chair;with call bell in reach;with family/visitor present Nurse Communication: Mobility status for transfers;Weight bearing status (how to hold LLE by ext. fixator to assist with NWB) General Behavior During Session: [facility] for tasks performed Cognition: [facility] for tasks performed  Cathrine Krizan 04/17/2011, 12:26 PM Pager [phone]
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
## 2011-04-17 NOTE — Progress Notes (Signed)
No BM yet. Will review bowel regimen.   Agree with above.

## 2011-04-17 NOTE — Progress Notes (Signed)
Subjective: Pt states his left ankle was doing ok, sore in back   Objective: Vital signs in last 24 hours: Temp:  [98 F (36.7 C)-99.1 F (37.3 C)] 98.4 F (36.9 C) (11/23 1008) Pulse Rate:  [82-88] 85  (11/23 1008) Resp:  [17-24] 17  (11/23 1008) BP: (118-147)/(72-86) 121/74 mmHg (11/23 1008) SpO2:  [91 %-98 %] 97 % (11/23 1008) Weight:  [73.483 kg (162 lb)] 162 lb (73.483 kg) (11/23 0300)  Intake/Output from previous day: 11/22 0701 - 11/23 0700 In: -  Out: 1850 [Urine:1850] Intake/Output this shift:     Basename 04/15/11 0600  HGB 12.7*    Basename 04/15/11 0600  WBC 11.1*  RBC 3.98*  HCT 37.0*  PLT 161    Basename 04/15/11 0600  NA 137  K 4.3  CL 107  CO2 24  BUN 16  CREATININE 1.00  GLUCOSE 126*  CALCIUM 7.6*   No results found for this basename: LABPT:2,INR:2 in the last 72 hours  Left foot circulation intact, dressing dry fixator ok  Assessment/Plan: S/P ex fix left ankle doing well Continue with current care.   Norman Pearson 04/17/2011, 11:10 AM

## 2011-04-18 LAB — CBC
Hemoglobin: 10.5 g/dL — ABNORMAL LOW (ref 13.0–17.0)
RBC: 3.3 MIL/uL — ABNORMAL LOW (ref 4.22–5.81)

## 2011-04-18 MED ORDER — BISACODYL 10 MG RE SUPP
10.0000 mg | Freq: Once | RECTAL | Status: AC
  Administered 2011-04-18: 10 mg via RECTAL
  Filled 2011-04-18: qty 1

## 2011-04-18 NOTE — Plan of Care (Signed)
Problem: Consults Goal: Diagnosis - Spinal Surgery Outcome: Completed/Met Date Met:  04/18/11 Lumbar Laminectomy (Complex)     
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
## 2011-04-19 DIAGNOSIS — R339 Retention of urine, unspecified: Secondary | ICD-10-CM

## 2011-04-19 LAB — URINALYSIS, ROUTINE W REFLEX MICROSCOPIC
Glucose, UA: NEGATIVE mg/dL
Hgb urine dipstick: NEGATIVE
Ketones, ur: NEGATIVE mg/dL
Protein, ur: NEGATIVE mg/dL

## 2011-04-19 MED ORDER — TAMSULOSIN HCL 0.4 MG PO CAPS
0.4000 mg | ORAL_CAPSULE | Freq: Every day | ORAL | Status: DC
  Administered 2011-04-19 – 2011-04-26 (×7): 0.4 mg via ORAL
  Filled 2011-04-19 (×9): qty 1

## 2011-04-19 MED ORDER — POLYETHYLENE GLYCOL 3350 17 G PO PACK
17.0000 g | PACK | Freq: Once | ORAL | Status: AC
  Administered 2011-04-19: 17 g via ORAL
  Filled 2011-04-19: qty 1

## 2011-04-19 NOTE — Progress Notes (Signed)
Subjective: 5 Days Post-Op Procedure(s) (LRB): EXTERNAL FIXATION LEG (Left) POSTERIOR LUMBAR FUSION 3 LEVEL () IRRIGATION AND DEBRIDEMENT EXTREMITY (Left) Patient reports pain as mild and moderate.   Patient seen in rounds with Dr. Lequita Halt. Patient has complaints of continued back pain but states the leg is OK.  Family in room.  Has been up already.  Objective: Vital signs in last 24 hours: Temp:  [98.5 F (36.9 C)-99.5 F (37.5 C)] 98.7 F (37.1 C) (11/25 0552) Pulse Rate:  [88-101] 88  (11/25 0552) Resp:  [16-18] 16  (11/25 0552) BP: (121-135)/(74-78) 121/74 mmHg (11/25 0552) SpO2:  [95 %-98 %] 96 % (11/25 0552)  Intake/Output from previous day:  Intake/Output Summary (Last 24 hours) at 04/19/11 0906 Last data filed at 04/19/11 0229  Gross per 24 hour  Intake    400 ml  Output   3000 ml  Net  -2600 ml    Intake/Output this shift:    Labs: Results for orders placed during the hospital encounter of 04/14/11  CBC      Component Value Range   WBC 11.8 (*) 4.0 - 10.5 (K/uL)   RBC 4.79  4.22 - 5.81 (MIL/uL)   Hemoglobin 15.3  13.0 - 17.0 (g/dL)   HCT 47.8  29.5 - 62.1 (%)   MCV 92.3  78.0 - 100.0 (fL)   MCH 31.9  26.0 - 34.0 (pg)   MCHC 34.6  30.0 - 36.0 (g/dL)   RDW 30.8  65.7 - 84.6 (%)   Platelets 216  150 - 400 (K/uL)  DIFFERENTIAL      Component Value Range   Neutrophils Relative 86 (*) 43 - 77 (%)   Neutro Abs 10.1 (*) 1.7 - 7.7 (K/uL)   Lymphocytes Relative 8 (*) 12 - 46 (%)   Lymphs Abs 0.9  0.7 - 4.0 (K/uL)   Monocytes Relative 6  3 - 12 (%)   Monocytes Absolute 0.7  0.1 - 1.0 (K/uL)   Eosinophils Relative 1  0 - 5 (%)   Eosinophils Absolute 0.1  0.0 - 0.7 (K/uL)   Basophils Relative 0  0 - 1 (%)   Basophils Absolute 0.0  0.0 - 0.1 (K/uL)  COMPREHENSIVE METABOLIC PANEL      Component Value Range   Sodium 137  135 - 145 (mEq/L)   Potassium 3.8  3.5 - 5.1 (mEq/L)   Chloride 104  96 - 112 (mEq/L)   CO2 24  19 - 32 (mEq/L)   Glucose, Bld 132 (*) 70 - 99  (mg/dL)   BUN 15  6 - 23 (mg/dL)   Creatinine, Ser 9.62  0.50 - 1.35 (mg/dL)   Calcium 8.3 (*) 8.4 - 10.5 (mg/dL)   Total Protein 6.3  6.0 - 8.3 (g/dL)   Albumin 3.5  3.5 - 5.2 (g/dL)   AST 46 (*) 0 - 37 (U/L)   ALT 37  0 - 53 (U/L)   Alkaline Phosphatase 56  39 - 117 (U/L)   Total Bilirubin 0.9  0.3 - 1.2 (mg/dL)   GFR calc non Af Amer 69 (*) >90 (mL/min)   GFR calc Af Amer 80 (*) >90 (mL/min)  TYPE AND SCREEN      Component Value Range   ABO/RH(D) O POS     Antibody Screen NEG     Sample Expiration 04/17/2011    ABO/RH      Component Value Range   ABO/RH(D) O POS    CBC      Component Value  Range   WBC 11.1 (*) 4.0 - 10.5 (K/uL)   RBC 3.98 (*) 4.22 - 5.81 (MIL/uL)   Hemoglobin 12.7 (*) 13.0 - 17.0 (g/dL)   HCT 16.1 (*) 09.6 - 52.0 (%)   MCV 93.0  78.0 - 100.0 (fL)   MCH 31.9  26.0 - 34.0 (pg)   MCHC 34.3  30.0 - 36.0 (g/dL)   RDW 04.5  40.9 - 81.1 (%)   Platelets 161  150 - 400 (K/uL)  BASIC METABOLIC PANEL      Component Value Range   Sodium 137  135 - 145 (mEq/L)   Potassium 4.3  3.5 - 5.1 (mEq/L)   Chloride 107  96 - 112 (mEq/L)   CO2 24  19 - 32 (mEq/L)   Glucose, Bld 126 (*) 70 - 99 (mg/dL)   BUN 16  6 - 23 (mg/dL)   Creatinine, Ser 9.14  0.50 - 1.35 (mg/dL)   Calcium 7.6 (*) 8.4 - 10.5 (mg/dL)   GFR calc non Af Amer 79 (*) >90 (mL/min)   GFR calc Af Amer >90  >90 (mL/min)  MRSA PCR SCREENING      Component Value Range   MRSA by PCR NEGATIVE  NEGATIVE   CBC      Component Value Range   WBC 7.5  4.0 - 10.5 (K/uL)   RBC 3.30 (*) 4.22 - 5.81 (MIL/uL)   Hemoglobin 10.5 (*) 13.0 - 17.0 (g/dL)   HCT 78.2 (*) 95.6 - 52.0 (%)   MCV 93.0  78.0 - 100.0 (fL)   MCH 31.8  26.0 - 34.0 (pg)   MCHC 34.2  30.0 - 36.0 (g/dL)   RDW 21.3  08.6 - 57.8 (%)   Platelets 177  150 - 400 (K/uL)    Exam - Neurovascular intact Sensation intact distally Dressing/Incision - serous drainage from pin sites Motor function intact - moving foot and toes well on exam.    Assessment/Plan: 5 Days Post-Op Procedure(s) (LRB): EXTERNAL FIXATION LEG (Left) POSTERIOR LUMBAR FUSION 3 LEVEL () IRRIGATION AND DEBRIDEMENT EXTREMITY (Left)  History reviewed. No pertinent past medical history.  Up with therapy   NWB to left leg  Floria Brandau 04/19/2011, 9:06 AM
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
## 2011-04-19 NOTE — Progress Notes (Signed)
Physical Therapy Treatment Patient Details Name: Norman Pearson MRN: [id number] DOB: 1951/04/26 Today's Date: 04/19/2011  PT Assessment/Plan  PT - Assessment/Plan Comments on Treatment Session: Pt reports he sat in chair for 2 hrs yesterday.  Once in bed, he did not do any upright sitting due to brace being too uncomfortable. Discussed need to appropriately utilize pain medicine to control pain and allow for upright in bed to assist with minimizing orthostasis.  Pt stated understanding and reported he would attempt upright in bed with  brace on.  Notifed Nurse Tech [name]) of this plan. PT Plan: Discharge plan remains appropriate;Frequency remains appropriate PT Frequency: Min 5X/week Follow Up Recommendations: Inpatient Rehab Equipment Recommended: Defer to next venue PT Goals  Acute Rehab PT Goals PT Goal: Rolling Supine to Right Side - Progress: Progressing toward goal PT Goal: Rolling Supine to Left Side - Progress: Progressing toward goal PT Goal: Supine/Side to Sit - Progress: Progressing toward goal PT Transfer Goal: Sit to Stand/Stand to Sit - Progress: Progressing toward goal PT Transfer Goal: Bed to Chair/Chair to Bed - Progress: Progressing toward goal PT Goal: Ambulate - Progress: Progressing toward goal  PT Treatment Precautions/Restrictions  Precautions Precautions: Back Precaution Booklet Issued: No Required Braces or Orthoses: Yes Spinal Brace: Thoracolumbosacral orthotic;Applied in supine position Restrictions Weight Bearing Restrictions: Yes LLE Weight Bearing: Non weight bearing Other Position/Activity Restrictions: external fixator on LLE Mobility (including Balance) Bed Mobility Rolling Right: 5: Set up;With rail Rolling Right Details (indicate cue type and reason): pillow placed medially to LLE to support in rolling Rolling Left: 5: Supervision;With rail Rolling Left Details (indicate cue type and reason): to monitor LLE/external fixator Left Sidelying to  Sit: 4: Min assist;With rails;HOB flat Sitting - Scoot to [place] of Bed: 5: Supervision Sitting - Scoot to [place] of Bed Details (indicate cue type and reason): for safety and to monitor NWB LLE Transfers Sit to Stand: 4: Min assist;With upper extremity assist;From elevated surface;From bed Sit to Stand Details (indicate cue type and reason): bed elevated ~8 inches, bil UE on RW and pushing down with PT stabilizing RW; pt able to maintain NWB s assist Stand to Sit: 3: Mod assist;With upper extremity assist;With armrests;To chair/3-in-1 Stand to Sit Details: to control descent and maintain NWB LLE Ambulation/Gait Ambulation/Gait Assistance: 4: Min assist Ambulation Distance (Feet): 8 Feet Assistive device: Rolling walker    Exercise    End of Session PT - End of Session Equipment Utilized During Treatment: Gait belt;Back brace Activity Tolerance: Treatment limited secondary to medical complications (Comment) (limited by orthostasis (see vitals)) Patient left: in chair;with call bell in reach;with family/visitor present Nurse Communication: Other (comment) (re: orthostasis) General Behavior During Session: [facility] for tasks performed Cognition: [facility] for tasks performed  Norman Pearson 04/19/2011, 12:53 PM Pager [phone]

## 2011-04-20 ENCOUNTER — Inpatient Hospital Stay (HOSPITAL_COMMUNITY): Payer: BC Managed Care – PPO

## 2011-04-20 DIAGNOSIS — S32009A Unspecified fracture of unspecified lumbar vertebra, initial encounter for closed fracture: Secondary | ICD-10-CM

## 2011-04-20 DIAGNOSIS — S82899A Other fracture of unspecified lower leg, initial encounter for closed fracture: Secondary | ICD-10-CM

## 2011-04-20 DIAGNOSIS — W1789XA Other fall from one level to another, initial encounter: Secondary | ICD-10-CM

## 2011-04-20 LAB — URINE CULTURE
Colony Count: NO GROWTH
Culture: NO GROWTH

## 2011-04-20 MED ORDER — SALINE SPRAY 0.65 % NA SOLN
1.0000 | NASAL | Status: DC | PRN
  Administered 2011-04-20: 1 via NASAL
  Filled 2011-04-20: qty 44

## 2011-04-20 MED ORDER — OXYCODONE HCL 5 MG PO TABS
5.0000 mg | ORAL_TABLET | ORAL | Status: DC | PRN
  Administered 2011-04-20 (×2): 5 mg via ORAL
  Administered 2011-04-20: 10 mg via ORAL
  Administered 2011-04-21 (×4): 5 mg via ORAL
  Administered 2011-04-21 – 2011-04-22 (×3): 10 mg via ORAL
  Administered 2011-04-22: 5 mg via ORAL
  Administered 2011-04-22: 10 mg via ORAL
  Administered 2011-04-22 – 2011-04-23 (×2): 5 mg via ORAL
  Administered 2011-04-23 – 2011-04-24 (×2): 10 mg via ORAL
  Administered 2011-04-25 (×2): 5 mg via ORAL
  Administered 2011-04-25 – 2011-04-26 (×4): 15 mg via ORAL
  Administered 2011-04-26: 10 mg via ORAL
  Administered 2011-04-26 – 2011-04-27 (×2): 15 mg via ORAL
  Administered 2011-04-27: 5 mg via ORAL
  Administered 2011-04-27: 15 mg via ORAL
  Filled 2011-04-20: qty 1
  Filled 2011-04-20: qty 2
  Filled 2011-04-20: qty 1
  Filled 2011-04-20: qty 3
  Filled 2011-04-20: qty 2
  Filled 2011-04-20 (×2): qty 3
  Filled 2011-04-20 (×6): qty 1
  Filled 2011-04-20: qty 3
  Filled 2011-04-20: qty 2
  Filled 2011-04-20: qty 1
  Filled 2011-04-20: qty 2
  Filled 2011-04-20: qty 1
  Filled 2011-04-20: qty 3
  Filled 2011-04-20: qty 2
  Filled 2011-04-20: qty 1
  Filled 2011-04-20 (×2): qty 2
  Filled 2011-04-20: qty 3
  Filled 2011-04-20: qty 2
  Filled 2011-04-20: qty 1
  Filled 2011-04-20: qty 3
  Filled 2011-04-20 (×2): qty 2
  Filled 2011-04-20: qty 1

## 2011-04-20 MED ORDER — GABAPENTIN 100 MG PO CAPS
100.0000 mg | ORAL_CAPSULE | Freq: Two times a day (BID) | ORAL | Status: DC
  Administered 2011-04-20 – 2011-04-21 (×4): 100 mg via ORAL
  Filled 2011-04-20 (×6): qty 1

## 2011-04-20 MED FILL — Heparin Sodium (Porcine) Inj 1000 Unit/ML: INTRAMUSCULAR | Qty: 30 | Status: AC

## 2011-04-20 MED FILL — Sodium Chloride IV Soln 0.9%: INTRAVENOUS | Qty: 1000 | Status: AC

## 2011-04-20 MED FILL — Sodium Chloride Irrigation Soln 0.9%: Qty: 3000 | Status: AC
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
## 2011-04-20 NOTE — Progress Notes (Signed)
Subjective: 6 Days Post-Op Procedure(s) (LRB): EXTERNAL FIXATION LEG (Left) POSTERIOR LUMBAR FUSION 3 LEVEL () IRRIGATION AND DEBRIDEMENT EXTREMITY (Left) Patient reports pain as mild.    Objective: Vital signs in last 24 hours: Temp:  [97.6 F (36.4 C)-98.4 F (36.9 C)] 98.4 F (36.9 C) (11/26 0634) Pulse Rate:  [99-142] 99  (11/26 0634) Resp:  [18-20] 18  (11/26 0634) BP: (103-152)/(67-82) 114/76 mmHg (11/26 0634) SpO2:  [94 %-100 %] 95 % (11/26 0634)  Intake/Output from previous day: 11/25 0701 - 11/26 0700 In: -  Out: 325 [Urine:325] Intake/Output this shift: Total I/O In: -  Out: 201 [Urine:200; Stool:1]   Basename 04/18/11 0630  HGB 10.5*    Basename 04/18/11 0630  WBC 7.5  RBC 3.30*  HCT 30.7*  PLT 177   No results found for this basename: NA:2,K:2,CL:2,CO2:2,BUN:2,CREATININE:2,GLUCOSE:2,CALCIUM:2 in the last 72 hours No results found for this basename: LABPT:2,INR:2 in the last 72 hours  left LE ex fix stable.  Wiggles toes.  Minimal drainage at pin sites.  Assessment/Plan: 6 Days Post-Op Procedure(s) (LRB): EXTERNAL FIXATION LEG (Left) POSTERIOR LUMBAR FUSION 3 LEVEL () IRRIGATION AND DEBRIDEMENT EXTREMITY (Left) Up with therapy  Will plan orif of L tibial pilon fracture for Thursday PM.  Continue elevation of LLE.  [name] 04/20/2011, 8:11 AM

## 2011-04-20 NOTE — Progress Notes (Signed)
Physical Therapy Treatment Patient Details Name: Norman Pearson MRN: [id number] DOB: Oct 13, 1950 Today's Date: 04/20/2011  PT Assessment/Plan  PT - Assessment/Plan Comments on Treatment Session: Pt continues to progress well.  Continued dizziness, however less so today. Pt reports up in chair x 3 hrs yesterday, then wore brace another 4 hours in the bed with HOBelevated>30 PT Plan: Discharge plan remains appropriate;Frequency remains appropriate Equipment Recommended: Defer to next venue PT Goals  Acute Rehab PT Goals PT Goal: Rolling Supine to Right Side - Progress: Progressing toward goal PT Goal: Supine/Side to Sit - Progress: Progressing toward goal PT Goal: Sit to Supine/Side - Progress: Progressing toward goal PT Transfer Goal: Sit to Stand/Stand to Sit - Progress: Progressing toward goal PT Transfer Goal: Bed to Chair/Chair to Bed - Progress: Progressing toward goal PT Goal: Ambulate - Progress: Progressing toward goal  PT Treatment Precautions/Restrictions  Precautions Precautions: Back Precaution Booklet Issued: No Required Braces or Orthoses: Yes Spinal Brace: Thoracolumbosacral orthotic;Applied in supine position Restrictions Weight Bearing Restrictions: Yes LLE Weight Bearing: Non weight bearing Other Position/Activity Restrictions: external fixator LLE Mobility (including Balance) Bed Mobility Bed Mobility: Yes Rolling Right: 5: Supervision Rolling Right Details (indicate cue type and reason): from Lt side to supine; supervision to monitor ext fixator Rolling Left: 5: Set up;With rail Left Sidelying to Sit: 4: Min assist;With rails;HOB flat Sitting - Scoot to Edge of Bed: 5: Supervision Transfers Sit to Stand: Other (comment);From chair/3-in-1 Sit to Stand Details (indicate cue type and reason): minguard A due to pt prefers one hand up on RWand other hand on chair/armrest; able to maintain NWB LLE Stand to Sit: 4: Min assist;With upper extremity assist;To bed;To  elevated surface Stand to Sit Details: bed elevated ~ 6 inches Ambulation/Gait Ambulation/Gait Assistance: 4: Min assist Ambulation/Gait Assistance Details (indicate cue type and reason): Pt with excellent use of UEs to unweight RLE, "hop," and then gently land. Ambulation Distance (Feet): 28 Feet Assistive device: Rolling walker  Posture/Postural Control Posture/Postural Control: No significant limitations Exercise    End of Session PT - End of Session Equipment Utilized During Treatment: Gait belt;Back brace Activity Tolerance: Other (comment) (limited by dizziness) Patient left: in bed;with call bell in reach;with family/visitor present (Daughter and Pam,PA present) General Behavior During Session: [facility] for tasks performed Cognition: [facility] for tasks performed  Nzinga Ferran 04/20/2011, 3:15 PM Pager [phone]
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
## 2011-04-20 NOTE — Progress Notes (Signed)
Subjective: Patient reports Right leg numb on ant thigh and pain when in certain positions.  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  Assessment/Plan: Right lower extremity numbness in L-2 or L3  LOS: 6 days  check CT of lumbar spine   Bernadette Armijo J 04/20/2011, 11:57 AM

## 2011-04-20 NOTE — Progress Notes (Signed)
Per NS patient to have CT to eval pain in his leg Patient examined and I agree with the assessment and plan  Ed Rayson E

## 2011-04-20 NOTE — Plan of Care (Signed)
Problem: Phase III Progression Outcomes Goal: Activity at appropriate level-compared to baseline (UP IN CHAIR FOR HEMODIALYSIS)  Outcome: Not Progressing Pt may need inpatient rehab or sniff placement since he may not have 24 hrs around the clock help at home

## 2011-04-20 NOTE — Progress Notes (Signed)
Occupational Therapy Treatment Patient Details Name: Norman Pearson MRN: [id number] DOB: 11-18-1950 Today's Date: 04/20/2011  OT Assessment/Plan OT Assessment/Plan OT Plan: Discharge plan remains appropriate OT Frequency: Min 2X/week Follow Up Recommendations: Inpatient Rehab Equipment Recommended: Defer to next venue OT Goals Acute Rehab OT Goals Time For Goal Achievement: 2 weeks ADL Goals ADL Goal: Toilet Transfer - Progress: Progressing toward goals ADL Goal: Toileting - Clothing Manipulation - Progress: Progressing toward goals ADL Goal: Toileting - Hygiene - Progress: Progressing toward goals  OT Treatment Precautions/Restrictions  Precautions Precautions: Back Precaution Booklet Issued: No Required Braces or Orthoses: Yes Spinal Brace: Thoracolumbosacral orthotic Restrictions Weight Bearing Restrictions: Yes LLE Weight Bearing: Non weight bearing Other Position/Activity Restrictions: external fixator LLE   ADL ADL Upper Body Dressing: Performed;Maximal assistance Upper Body Dressing Details (indicate cue type and reason): able to provide inst. for caregiver to don brace properly with max a  Where Assessed - Upper Body Dressing: Supine, head of bed flat;Rolling right and/or left Toilet Transfer: Performed;Minimal assistance Toilet Transfer Details (indicate cue type and reason): able to maintain NWB LLE during transfer to [facility] Toilet Transfer Method: Stand pivot Acupuncturist: Bedside commode Toileting - Clothing Manipulation: Performed;Minimal assistance Where Assessed - Glass blower/designer Manipulation: Standing Toileting - Hygiene: Performed;Supervision/safety Where Assessed - Toileting Hygiene: Sit on 3-in-1 or toilet Equipment Used: Rolling walker Mobility  Bed Mobility Bed Mobility: Yes Rolling Right: 5: Set up;With rail Rolling Left: 5: Set up;With rail Left Sidelying to Sit: 4: Min assist;With rails;HOB flat Sitting - Scoot to [place] of Bed:  5: Supervision Transfers Transfers: Yes Sit to Stand: 4: Min assist;With upper extremity assist;From elevated surface Sit to Stand Details (indicate cue type and reason): pt. requested bed height to be elevated for easier sit/stand, encouragement for BUE pushing through bed, pt. opted to have one hand on walker, one hand on the bed. reviewed that walker may tip to one side Stand to Sit: 4: Min assist;With upper extremity assist;With armrests Stand to Sit Details: sit/stand from 3-n-1 and also recliner Exercises    End of Session OT - End of Session Activity Tolerance: Patient tolerated treatment well Patient left: in chair;with call bell in reach;with family/visitor present Nurse Communication: Mobility status for transfers General Behavior During Session: [facility] for tasks performed Cognition: [facility] for tasks performed  Robet Leu COTA/L 04/20/2011, 12:02 PM

## 2011-04-20 NOTE — Progress Notes (Signed)
I met with patient, daughter, and granddaughter at bedside. Discussed the need to await therapy progress after surgery Thursday to determine rehab venue options. [employer] insurance will make final determination based on therapy progress. I will follow his progress daily. Please, call with any questions. Pager [phone].

## 2011-04-21 MED ORDER — ENSURE CLINICAL ST REVIGOR PO LIQD
237.0000 mL | Freq: Two times a day (BID) | ORAL | Status: DC
  Administered 2011-04-21 – 2011-04-27 (×11): 237 mL via ORAL

## 2011-04-21 NOTE — Progress Notes (Signed)
Physical Therapy Treatment Patient Details Name: Norman Pearson MRN: [id number] DOB: [birth year] Today's Date: 04/21/2011  PT Assessment/Plan  PT - Assessment/Plan Comments on Treatment Session: Still with slight dizziness limiting ambulation distance. PT Plan: Discharge plan remains appropriate;Frequency remains appropriate PT Frequency: Min 5X/week Follow Up Recommendations: Inpatient Rehab Equipment Recommended: Defer to next venue PT Goals  Acute Rehab PT Goals PT Goal Formulation: With patient Time For Goal Achievement: 2 weeks Pt will Roll Supine to Right Side: with modified independence PT Goal: Rolling Supine to Right Side - Progress: Progressing toward goal Pt will Roll Supine to Left Side: with modified independence PT Goal: Rolling Supine to Left Side - Progress: Progressing toward goal Pt will go Supine/Side to Sit: with supervision;with HOB 0 degrees;with cues (comment type and amount) PT Goal: Supine/Side to Sit - Progress: Progressing toward goal Pt will go Sit to Supine/Side: with supervision;with HOB 0 degrees;with cues (comment type and amount) PT Goal: Sit to Supine/Side - Progress: Progressing toward goal Pt will Transfer Sit to Stand/Stand to Sit: with supervision;with upper extremity assist PT Transfer Goal: Sit to Stand/Stand to Sit - Progress: Progressing toward goal Pt will Transfer Bed to Chair/Chair to Bed: with supervision PT Transfer Goal: Bed to Chair/Chair to Bed - Progress: Progressing toward goal Pt will Ambulate: 1 - 15 feet;with supervision;with least restrictive assistive device PT Goal: Ambulate - Progress: Progressing toward goal Pt will Propel Wheelchair: 51 - 150 feet;with supervision;Other (comment) PT Goal: Propel Wheelchair - Progress: Progressing toward goal  PT Treatment Precautions/Restrictions  Precautions Precautions: Back Precaution Booklet Issued: No Precaution Comments: Pt able to recall 3/3 back precautions  independently. Required Braces or Orthoses: Yes Spinal Brace: Thoracolumbosacral orthotic;Applied in supine position Restrictions Weight Bearing Restrictions: Yes LLE Weight Bearing: Non weight bearing Other Position/Activity Restrictions: external fixator LLE Pain 4/10 in back.  Pt repositioned after treatment. Mobility (including Balance) Bed Mobility Bed Mobility: Yes Rolling Right: Not tested (comment) (Only rolling left needed with treatment.) Rolling Left: With rail;5: Supervision Rolling Left Details (indicate cue type and reason): Pt able to roll with cues to complete motion for brace donning supine. Right Sidelying to Sit: Not tested (comment) (Performed left sidelying to sit.) Left Sidelying to Sit: 4: Min assist;With rails;HOB flat Left Sidelying to Sit Details (indicate cue type and reason): Assist to guide left LE off EOB only for protection with cues for sequence. Sitting - Scoot to Edge of Bed: 6: Modified independent (Device/Increase time) Sit to Supine - Right: Not Tested (comment) (Pt remained OOB in recliner.) Transfers Transfers: Yes Sit to Stand: 4: Min assist;From bed;With upper extremity assist Sit to Stand Details (indicate cue type and reason): Assist to complete translation of trunk anterior over BOS secondary to slight right knee buckling with patient catching/controling motion and PT providing safety.  Cues for safest hand placement. Stand to Sit: 5: Supervision;To chair/3-in-1;With upper extremity assist Stand to Sit Details: Verbal cues only required for safest positioning of left LE. Ambulation/Gait Ambulation/Gait: Yes Ambulation/Gait Assistance: 4: Min assist Ambulation/Gait Assistance Details (indicate cue type and reason): Assist for balance with fatigue to continue to off weight left LE to maintain NWB of left LE.  Pt with controled depression of bilateral scapulas for step-to inside RW with right LEs. Ambulation Distance (Feet): 35 Feet Assistive  device: Rolling walker Gait Pattern: Step-to pattern;Decreased step length - right Stairs: No Wheelchair Mobility Wheelchair Mobility: No  Posture/Postural Control Posture/Postural Control: No significant limitations Balance Balance Assessed: No Exercise    End  of Session PT - End of Session Equipment Utilized During Treatment: Gait belt;Back brace Activity Tolerance: Patient tolerated treatment well;Other (comment) (Still reports slight dizziness with ambulation.) Patient left: in chair;with call bell in reach;with family/visitor present Nurse Communication: Mobility status for ambulation;Mobility status for transfers General Behavior During Session: [facility] for tasks performed Cognition: [facility] for tasks performed  Cephus Shelling 04/21/2011, 12:17 PM  04/21/2011 Cephus Shelling, PT, DPT [phone]
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
## 2011-04-21 NOTE — Progress Notes (Signed)
Eating lunch, BM yesterday Patient examined and I agree with the assessment and plan  Norman Pearson

## 2011-04-22 LAB — URINALYSIS, ROUTINE W REFLEX MICROSCOPIC
Glucose, UA: NEGATIVE mg/dL
Leukocytes, UA: NEGATIVE
pH: 6.5 (ref 5.0–8.0)

## 2011-04-22 MED ORDER — GABAPENTIN 100 MG PO CAPS
100.0000 mg | ORAL_CAPSULE | Freq: Three times a day (TID) | ORAL | Status: DC
  Administered 2011-04-22 – 2011-04-27 (×15): 100 mg via ORAL
  Filled 2011-04-22 (×17): qty 1

## 2011-04-22 NOTE — Progress Notes (Signed)
Physical Therapy Treatment Patient Details Name: Norman Pearson MRN: [id number] DOB: 07/04/1950 Today's Date: 04/22/2011  PT Assessment/Plan  PT - Assessment/Plan Comments on Treatment Session:   PT Plan: Discharge plan remains appropriate;Frequency remains appropriate PT Frequency: Min 5X/week Follow Up Recommendations: Inpatient Rehab Equipment Recommended: Defer to next venue PT Goals  Acute Rehab PT Goals PT Goal: Rolling Supine to Right Side - Progress: Progressing toward goal PT Goal: Rolling Supine to Left Side - Progress: Progressing toward goal PT Goal: Supine/Side to Sit - Progress: Progressing toward goal PT Transfer Goal: Sit to Stand/Stand to Sit - Progress: Progressing toward goal PT Goal: Ambulate - Progress: Progressing toward goal  PT Treatment Precautions/Restrictions  Precautions Precautions: Back Precaution Booklet Issued: No Precaution Comments: Pt able to recall 3/3 back precautions independently. Required Braces or Orthoses: Yes Spinal Brace: Thoracolumbosacral orthotic;Applied in supine position Restrictions Weight Bearing Restrictions: Yes LLE Weight Bearing: Non weight bearing Other Position/Activity Restrictions: external fixator LLE Mobility (including Balance) Bed Mobility Rolling Right: 5: Supervision;With rail Rolling Right Details (indicate cue type and reason): Pt felt LLE was "getting caught" and required supervision of LLE while rolling Rolling Left: 6: Modified independent (Device/Increase time);With rail Left Sidelying to Sit: 4: Min assist;HOB flat;With rails Sitting - Scoot to Edge of Bed: 6: Modified independent (Device/Increase time) Transfers Sit to Stand: 4: Min assist;From bed;With upper extremity assist;From elevated surface Sit to Stand Details (indicate cue type and reason): Pt required subtle cue for safest hand placement with RW during transition Stand to Sit: 5: Supervision;To chair/3-in-1;With upper extremity assist;With  armrests Stand to Sit Details: for safety with LLE (nearly catching external fixator on RW) Ambulation/Gait Ambulation/Gait Assistance: 4: Min assist Ambulation/Gait Assistance Details (indicate cue type and reason): mingurard A; noted RW ~1 inch too tall with incr strain on bil UEs--during seated rest RW adjusted to proper height and pt with less UE effort during gait Ambulation Distance (Feet): 35 Feet (16 ft after seated rest/RW adjustment) Assistive device: Rolling walker Gait Pattern:  (NWB LLE, )  Posture/Postural Control Posture/Postural Control: No significant limitations Exercise    End of Session PT - End of Session Equipment Utilized During Treatment: Gait belt;Back brace Activity Tolerance: Patient tolerated treatment well Patient left: in chair;with call bell in reach Nurse Communication: Mobility status for ambulation General Behavior During Session: [facility] for tasks performed Cognition: [facility] for tasks performed  Sarrinah Gardin 04/22/2011, 3:15 PM Pager [phone]

## 2011-04-22 NOTE — Progress Notes (Signed)
This patient has been seen and I agree with the findings and treatment plan.  Latravious Levitt O. Jazzmen Restivo, III, MD, FACS [phone] (pager) [phone] (direct pager) Trauma Surgeon  
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
## 2011-04-23 ENCOUNTER — Inpatient Hospital Stay (HOSPITAL_COMMUNITY): Payer: BC Managed Care – PPO | Admitting: Anesthesiology

## 2011-04-23 ENCOUNTER — Inpatient Hospital Stay (HOSPITAL_COMMUNITY): Payer: BC Managed Care – PPO

## 2011-04-23 ENCOUNTER — Encounter (HOSPITAL_COMMUNITY): Payer: Self-pay | Admitting: Anesthesiology

## 2011-04-23 ENCOUNTER — Encounter (HOSPITAL_COMMUNITY): Admission: EM | Disposition: A | Discharge status: Rehabilitation Facility | Payer: Self-pay | Source: Ambulatory Visit

## 2011-04-23 ENCOUNTER — Other Ambulatory Visit: Payer: Self-pay

## 2011-04-23 HISTORY — PX: ORIF TIBIA FRACTURE: SHX5416

## 2011-04-23 LAB — TYPE AND SCREEN

## 2011-04-23 LAB — URINE CULTURE
Colony Count: 30000
Culture  Setup Time: 201211280759

## 2011-04-23 LAB — CBC
MCHC: 33.8 g/dL (ref 30.0–36.0)
MCV: 92.5 fL (ref 78.0–100.0)
Platelets: 285 10*3/uL (ref 150–400)
RDW: 12.9 % (ref 11.5–15.5)
WBC: 8.2 10*3/uL (ref 4.0–10.5)

## 2011-04-23 SURGERY — OPEN REDUCTION INTERNAL FIXATION (ORIF) TIBIA FRACTURE
Anesthesia: General | Laterality: Left

## 2011-04-23 MED ORDER — PROPOFOL 10 MG/ML IV EMUL
INTRAVENOUS | Status: DC | PRN
  Administered 2011-04-23: 150 mg via INTRAVENOUS

## 2011-04-23 MED ORDER — GLYCOPYRROLATE 0.2 MG/ML IJ SOLN
INTRAMUSCULAR | Status: DC | PRN
  Administered 2011-04-23: .5 mg via INTRAVENOUS

## 2011-04-23 MED ORDER — BETHANECHOL CHLORIDE 10 MG PO TABS
10.0000 mg | ORAL_TABLET | Freq: Three times a day (TID) | ORAL | Status: DC
  Administered 2011-04-24 – 2011-04-26 (×10): 10 mg via ORAL
  Filled 2011-04-23 (×13): qty 1

## 2011-04-23 MED ORDER — ONDANSETRON HCL 4 MG/2ML IJ SOLN
INTRAMUSCULAR | Status: DC | PRN
  Administered 2011-04-23: 4 mg via INTRAVENOUS

## 2011-04-23 MED ORDER — MIDAZOLAM HCL 5 MG/5ML IJ SOLN
INTRAMUSCULAR | Status: DC | PRN
  Administered 2011-04-23: 2 mg via INTRAVENOUS

## 2011-04-23 MED ORDER — MORPHINE SULFATE 10 MG/ML IJ SOLN
INTRAMUSCULAR | Status: DC | PRN
  Administered 2011-04-23: 10 mg via INTRAVENOUS

## 2011-04-23 MED ORDER — ROCURONIUM BROMIDE 100 MG/10ML IV SOLN
INTRAVENOUS | Status: DC | PRN
Start: 2011-04-23 — End: 2011-04-23
  Administered 2011-04-23: 50 mg via INTRAVENOUS

## 2011-04-23 MED ORDER — MEPERIDINE HCL 25 MG/ML IJ SOLN: 6.2500 mg | INTRAMUSCULAR | Status: DC | PRN

## 2011-04-23 MED ORDER — HYDROMORPHONE HCL PF 1 MG/ML IJ SOLN
0.2500 mg | INTRAMUSCULAR | Status: DC | PRN
  Administered 2011-04-23 (×4): 0.5 mg via INTRAVENOUS
  Filled 2011-04-23: qty 1

## 2011-04-23 MED ORDER — OXYCODONE HCL 20 MG PO TB12
20.0000 mg | ORAL_TABLET | Freq: Two times a day (BID) | ORAL | Status: DC
  Administered 2011-04-24 – 2011-04-25 (×5): 20 mg via ORAL
  Filled 2011-04-23 (×7): qty 1

## 2011-04-23 MED ORDER — SUFENTANIL CITRATE 50 MCG/ML IV SOLN
INTRAVENOUS | Status: DC | PRN
  Administered 2011-04-23: 30 ug via INTRAVENOUS
  Administered 2011-04-23: 20 ug via INTRAVENOUS
  Administered 2011-04-23: 10 ug via INTRAVENOUS
  Administered 2011-04-23: 20 ug via INTRAVENOUS
  Administered 2011-04-23 (×7): 10 ug via INTRAVENOUS

## 2011-04-23 MED ORDER — KETOROLAC TROMETHAMINE 30 MG/ML IJ SOLN
15.0000 mg | Freq: Once | INTRAMUSCULAR | Status: AC | PRN
  Administered 2011-04-23: 30 mg via INTRAVENOUS

## 2011-04-23 MED ORDER — BACITRACIN ZINC 500 UNIT/GM EX OINT
TOPICAL_OINTMENT | CUTANEOUS | Status: DC | PRN
  Administered 2011-04-23: 1 via TOPICAL

## 2011-04-23 MED ORDER — SODIUM CHLORIDE 0.9 % IV SOLN
INTRAVENOUS | Status: DC
  Administered 2011-04-23: 07:00:00 via INTRAVENOUS

## 2011-04-23 MED ORDER — NEOSTIGMINE METHYLSULFATE 1 MG/ML IJ SOLN
INTRAMUSCULAR | Status: DC | PRN
  Administered 2011-04-23: 3 mg via INTRAVENOUS

## 2011-04-23 MED ORDER — LACTATED RINGERS IV SOLN
INTRAVENOUS | Status: DC | PRN
  Administered 2011-04-23: 15:00:00 via INTRAVENOUS

## 2011-04-23 MED ORDER — LACTATED RINGERS IV SOLN
INTRAVENOUS | Status: DC
  Administered 2011-04-23 – 2011-04-25 (×3): via INTRAVENOUS

## 2011-04-23 MED ORDER — BUPIVACAINE LIPOSOME 1.3 % IJ SUSP
20.0000 mL | INTRAMUSCULAR | Status: AC
  Administered 2011-04-23: 20 mL
  Filled 2011-04-23: qty 20

## 2011-04-23 MED ORDER — CEFAZOLIN SODIUM-DEXTROSE 2-3 GM-% IV SOLR
2.0000 g | INTRAVENOUS | Status: AC
  Administered 2011-04-23: 2 g via INTRAVENOUS
  Filled 2011-04-23 (×3): qty 50

## 2011-04-23 MED ORDER — PROMETHAZINE HCL 25 MG/ML IJ SOLN
6.2500 mg | INTRAMUSCULAR | Status: DC | PRN
  Filled 2011-04-23: qty 1

## 2011-04-23 SURGICAL SUPPLY — 85 items
BANDAGE COBAN STERILE 4 (GAUZE/BANDAGES/DRESSINGS) ×1 IMPLANT
BANDAGE COBAN STERILE 6 (GAUZE/BANDAGES/DRESSINGS) ×1 IMPLANT
BANDAGE ESMARK 6X9 LF (GAUZE/BANDAGES/DRESSINGS) ×1 IMPLANT
BIT DRILL 2.5X2.75 QC CALB (BIT) ×1 IMPLANT
BIT DRILL CALIBRATED 2.7 (BIT) ×1 IMPLANT
BLADE SURG 10 STRL SS (BLADE) ×2 IMPLANT
BNDG CMPR 9X6 STRL LF SNTH (GAUZE/BANDAGES/DRESSINGS) ×1
BNDG COHESIVE 4X5 TAN STRL (GAUZE/BANDAGES/DRESSINGS) ×2 IMPLANT
BNDG COHESIVE 6X5 TAN STRL LF (GAUZE/BANDAGES/DRESSINGS) ×2 IMPLANT
BNDG ESMARK 6X9 LF (GAUZE/BANDAGES/DRESSINGS) ×2
BONE CANC CHIPS 20CC PCAN1/4 (Bone Implant) ×2 IMPLANT
CHIPS CANC BONE 20CC PCAN1/4 (Bone Implant) ×1 IMPLANT
CHLORAPREP W/TINT 26ML (MISCELLANEOUS) ×2 IMPLANT
CLOTH BEACON ORANGE TIMEOUT ST (SAFETY) ×2 IMPLANT
COVER SURGICAL LIGHT HANDLE (MISCELLANEOUS) ×2 IMPLANT
CUFF TOURNIQUET SINGLE 34IN LL (TOURNIQUET CUFF) ×3 IMPLANT
CUFF TOURNIQUET SINGLE 44IN (TOURNIQUET CUFF) IMPLANT
DRAPE C-ARM 42X72 X-RAY (DRAPES) ×2 IMPLANT
DRAPE C-ARMOR (DRAPES) ×1 IMPLANT
DRAPE U-SHAPE 47X51 STRL (DRAPES) ×2 IMPLANT
DRSG ADAPTIC 3X8 NADH LF (GAUZE/BANDAGES/DRESSINGS) ×1 IMPLANT
DRSG PAD ABDOMINAL 8X10 ST (GAUZE/BANDAGES/DRESSINGS) ×4 IMPLANT
ELECT REM PT RETURN 9FT ADLT (ELECTROSURGICAL) ×2
ELECTRODE REM PT RTRN 9FT ADLT (ELECTROSURGICAL) ×1 IMPLANT
GAUZE SPONGE 4X4 12PLY STRL LF (GAUZE/BANDAGES/DRESSINGS) ×1 IMPLANT
GLOVE BIO SURGEON STRL SZ8 (GLOVE) ×2 IMPLANT
GLOVE BIOGEL PI IND STRL 8 (GLOVE) ×1 IMPLANT
GLOVE BIOGEL PI INDICATOR 8 (GLOVE) ×1
GLOVE BIOGEL PI ORTHO PRO SZ7 (GLOVE) ×2
GLOVE EUDERMIC 7 POWDERFREE (GLOVE) ×1 IMPLANT
GLOVE PI ORTHO PRO STRL SZ7 (GLOVE) IMPLANT
GOWN PREVENTION PLUS XLARGE (GOWN DISPOSABLE) ×2 IMPLANT
GOWN STRL NON-REIN LRG LVL3 (GOWN DISPOSABLE) ×4 IMPLANT
GRAFT BNE CANC CHIPS 1-8 20CC (Bone Implant) IMPLANT
K-WIRE ACE 1.6X6 (WIRE) ×10
KIT BASIN OR (CUSTOM PROCEDURE TRAY) ×2 IMPLANT
KIT ROOM TURNOVER OR (KITS) ×2 IMPLANT
KWIRE ACE 1.6X6 (WIRE) IMPLANT
MANIFOLD NEPTUNE II (INSTRUMENTS) ×2 IMPLANT
NEEDLE 22X1 1/2 (OR ONLY) (NEEDLE) IMPLANT
NS IRRIG 1000ML POUR BTL (IV SOLUTION) ×2 IMPLANT
PACK ORTHO EXTREMITY (CUSTOM PROCEDURE TRAY) ×2 IMPLANT
PAD ARMBOARD 7.5X6 YLW CONV (MISCELLANEOUS) ×2 IMPLANT
PAD CAST 4YDX4 CTTN HI CHSV (CAST SUPPLIES) ×1 IMPLANT
PADDING CAST COTTON 4X4 STRL (CAST SUPPLIES) ×4
PADDING WEBRIL 4 STERILE (GAUZE/BANDAGES/DRESSINGS) ×1 IMPLANT
PADDING WEBRIL 6 STERILE (GAUZE/BANDAGES/DRESSINGS) ×1 IMPLANT
PLATE 9H LT DIST ANTLAT TIB (Plate) ×2 IMPLANT
PLATE ANTLAT CNTR W 157X9 (Plate) IMPLANT
SCREW CORTICAL 3.5MM  28MM (Screw) ×1 IMPLANT
SCREW CORTICAL 3.5MM  30MM (Screw) ×1 IMPLANT
SCREW CORTICAL 3.5MM  34MM (Screw) ×1 IMPLANT
SCREW CORTICAL 3.5MM 28MM (Screw) IMPLANT
SCREW CORTICAL 3.5MM 30MM (Screw) IMPLANT
SCREW CORTICAL 3.5MM 34MM (Screw) IMPLANT
SCREW CORTICAL 3.5MM 36MM (Screw) IMPLANT
SCREW LOCK CORT STAR 3.5X26 (Screw) ×1 IMPLANT
SCREW LOCK CORT STAR 3.5X30 (Screw) ×1 IMPLANT
SCREW LOCK CORT STAR 3.5X34 (Screw) ×1 IMPLANT
SCREW LOCK CORT STAR 3.5X42 (Screw) ×1 IMPLANT
SCREW LOCK CORT STAR 3.5X46 (Screw) ×1 IMPLANT
SCREW LOCK CORT STAR 3.5X48 (Screw) ×2 IMPLANT
SCREW LOCK CORT STAR 3.5X50 (Screw) ×1 IMPLANT
SCREW NLOCK CANC HEX 4X60 (Screw) IMPLANT
SOAP 2 % CHG 4 OZ (WOUND CARE) ×2 IMPLANT
SPLINT FAST PLASTER 5X30 (CAST SUPPLIES) ×1
SPLINT PLASTER CAST FAST 5X30 (CAST SUPPLIES) IMPLANT
SPONGE GAUZE 4X4 12PLY (GAUZE/BANDAGES/DRESSINGS) ×1 IMPLANT
STAPLER VISISTAT 35W (STAPLE) IMPLANT
SUCTION FRAZIER TIP 10 FR DISP (SUCTIONS) ×2 IMPLANT
SUT MNCRL AB 3-0 PS2 18 (SUTURE) ×1 IMPLANT
SUT MON AB 3-0 SH 27 (SUTURE) ×2
SUT MON AB 3-0 SH27 (SUTURE) IMPLANT
SUT PROLENE 3 0 PS 2 (SUTURE) ×2 IMPLANT
SUT VIC AB 0 CT1 27 (SUTURE) ×4
SUT VIC AB 0 CT1 27XBRD ANBCTR (SUTURE) IMPLANT
SUT VIC AB 2-0 CT1 27 (SUTURE) ×4
SUT VIC AB 2-0 CT1 TAPERPNT 27 (SUTURE) ×2 IMPLANT
SUT VIC AB 3-0 PS2 18 (SUTURE) ×2
SUT VIC AB 3-0 PS2 18XBRD (SUTURE) ×1 IMPLANT
SYR CONTROL 10ML LL (SYRINGE) IMPLANT
TOWEL OR 17X24 6PK STRL BLUE (TOWEL DISPOSABLE) ×4 IMPLANT
TOWEL OR 17X26 10 PK STRL BLUE (TOWEL DISPOSABLE) ×2 IMPLANT
TUBE CONNECTING 12X1/4 (SUCTIONS) ×2 IMPLANT
WATER STERILE IRR 1000ML POUR (IV SOLUTION) ×2 IMPLANT
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
## 2011-04-23 NOTE — Anesthesia Preprocedure Evaluation (Addendum)
Anesthesia Evaluation  Patient identified by MRN, date of birth, ID band Patient awake    Reviewed: Allergy & Precautions, H&P , NPO status , Patient's Chart, lab work & pertinent test results  History of Anesthesia Complications Negative for: history of anesthetic complications  Airway Mallampati: I  Neck ROM: Full    Dental  (+) Teeth Intact and Dental Advisory Given   Pulmonary neg pulmonary ROS,          Cardiovascular neg cardio ROS - DOE Regular Normal    Neuro/Psych    GI/Hepatic negative GI ROS, Neg liver ROS,   Endo/Other    Renal/GU      Musculoskeletal   Abdominal Normal abdominal exam  (+)   Peds  Hematology Anemia   Anesthesia Other Findings   Reproductive/Obstetrics                          Anesthesia Physical Anesthesia Plan  ASA: I  Anesthesia Plan: General   Post-op Pain Management:    Induction: Intravenous  Airway Management Planned: Oral ETT  Additional Equipment:   Intra-op Plan:   Post-operative Plan: Extubation in OR  Informed Consent: I have reviewed the patients History and Physical, chart, labs and discussed the procedure including the risks, benefits and alternatives for the proposed anesthesia with the patient or authorized representative who has indicated his/her understanding and acceptance.   Dental advisory given  Plan Discussed with: CRNA and Surgeon  Anesthesia Plan Comments:         Anesthesia Quick Evaluation

## 2011-04-23 NOTE — PMR Pre-admission (Signed)
PMR Admission Coordinator Pre-Admission Assessment  Patient:  Norman Pearson is an 60 y.o., male MRN:  [id number] DOB:  December 10, 1950 Height:  Height: 6' (182.9 cm) Weight:  Weight: 73.483 kg (162 lb)  Insurance Information: HMO:    PPO:yes     PCP:     IPA:     80/20:     OTHER: PRIMARY:[employer] PPO      Policy#:[id number]      Subscriber:Pt CM Name:Cathy      Phone#:[phone]     XBM#:[phone] Pre-Cert#:pending      Employer:selfemployed Benefits:  Phone #:[phone]     Name:[name] 04/23/2011 Eff. Date:05/25/2008     Deduct:$5000   (met $3765.79)  Out of Pocket Max:$5000 which is the deductible      Life HKV:[id number] CIR:100%      SNF:100% Outpatient:100%     Co-Pay:none Home Health:100%      Co-Pay:none DME:100%     Co-Pay:none Providers:in network SECONDARY:none       Current Medical History:   Patient Admitting Diagnosis:L1 burst fracture and left distal tibia fracture   History of Present Illness: Fall from 12 foot ladder on 04/14/11. Had I and D left hand laceration, closed reduction with placement of external fixator on LLE. Underwent posterior decompresion of L1 burst fx with posterior fixation and arthrodesis T12-L2 11/20. TLSO and NWB LLE.  11/29 [facility] for ORIF Tibia fx and removal external fixator. Voiding difficulties over the weekend. Removed foley 12/3 and unable to void. Replaced foley and 1100 cc obtained.   Patients Past Medical History:  History reviewed. No pertinent past medical history.  Family Medical History:  family history is not on file.      Prior Rehab/Hospitalizations: none  Medications Current Medications: Current facility-administered medications:acetaminophen (TYLENOL) tablet 650 mg, 650 mg, Oral, Q4H PRN, Shary Key Elsner, 650 mg at 04/20/11 1646;  alum & mag hydroxide-simeth (MAALOX PLUS) 400-400-40 MG/5ML suspension 30 mL, 30 mL, Oral, Q6H PRN, Shary Key Elsner;  bethanechol (URECHOLINE) tablet 50 mg, 50 mg, Oral, Once, [name], PA, 50 mg at 04/27/11 5643 bisacodyl (DULCOLAX) EC tablet 10 mg, 10 mg, Oral, Q1200, Almond Lint, MD, 10 mg at 04/27/11 1212;  bisacodyl (DULCOLAX) suppository 10 mg, 10 mg, Rectal, Daily PRN, Almond Lint, MD;  cyclobenzaprine (FLEXERIL) tablet 10 mg, 10 mg, Oral, TID PRN, Mariam Dollar, 10 mg at 04/25/11 0601;  docusate sodium (COLACE) capsule 100 mg, 100 mg, Oral, BID, Shary Key Elsner, 100 mg at 04/27/11 1025 feeding supplement (ENSURE CLINICAL STRENGTH) liquid 237 mL, 237 mL, Oral, BID, Shawn Rayburn, PA, 237 mL at 04/27/11 1000;  gabapentin (NEURONTIN) capsule 100 mg, 100 mg, Oral, TID, Shawn Rayburn, PA, 100 mg at 04/27/11 1025;  HYDROmorphone (DILAUDID) injection 0.5 mg, 0.5 mg, Intravenous, Q3H PRN, [name], PA;  LORazepam (ATIVAN) tablet 0.5 mg, 0.5 mg, Oral, Daily PRN, Sherrie George, PA, 0.5 mg at 04/27/11 0813 LORazepam (ATIVAN) tablet 1 mg, 1 mg, Oral, QHS PRN, Sherrie George, PA, 1 mg at 04/26/11 2208;  menthol-cetylpyridinium (CEPACOL) lozenge 3 mg, 1 lozenge, Oral, PRN, Shary Key Elsner;  ondansetron [facility]) injection 4 mg, 4 mg, Intravenous, Q4H PRN, Shary Key Elsner, 4 mg at 04/15/11 0234;  ondansetron (ZOFRAN) tablet 4 mg, 4 mg, Oral, Q6H PRN, Atilano Ina, MD oxyCODONE (Oxy IR/ROXICODONE) immediate release tablet 5-15 mg, 5-15 mg, Oral, Q3H PRN, Shawn Rayburn, PA, 5 mg at 04/27/11 1025;  pantoprazole (PROTONIX) EC tablet 40 mg, 40 mg, Oral, Q1200,  Atilano Ina, MD, 40 mg at 04/27/11 1213;  phenol (CHLORASEPTIC) mouth spray 1 spray, 1 spray, Mouth/Throat, PRN, Shary Key Elsner;  polyethylene glycol (MIRALAX / GLYCOLAX) packet 17 g, 17 g, Oral, BID PRN, Sherrie George, PA, 17 g at 04/26/11 1108 promethazine (PHENERGAN) tablet 25 mg, 25 mg, Oral, Q8H PRN, Cherylynn Ridges III, MD;  sodium chloride (OCEAN) 0.65 % nasal spray 1 spray, 1 spray, Each Nare, PRN, Cherylynn Ridges III, MD, 1 spray at 04/20/11 2211;  Tamsulosin HCl (FLOMAX) capsule 0.8 mg, 0.8 mg, Oral, Once, [name], PA, 0.8 mg at 04/27/11 8119;  DISCONTD: bethanechol (URECHOLINE) tablet 10 mg, 10 mg, Oral, TID, Liz Malady, MD, 10 mg at 04/26/11 2208 DISCONTD: HYDROmorphone (DILAUDID) injection 0.25-0.5 mg, 0.25-0.5 mg, Intravenous, Q5 min PRN, Josepha Pigg, MD, 0.5 mg at 04/23/11 2014;  DISCONTD: HYDROmorphone (DILAUDID) injection 1-2 mg, 1-2 mg, Intravenous, Q2H PRN, Atilano Ina, MD, 2 mg at 04/26/11 0211;  DISCONTD: oxyCODONE (OXYCONTIN) 12 hr tablet 20 mg, 20 mg, Oral, Q12H, [name], MD, 20 mg at 04/25/11 2141 DISCONTD: Tamsulosin HCl (FLOMAX) capsule 0.4 mg, 0.4 mg, Oral, Daily, Almond Lint, MD, 0.4 mg at 04/26/11 1109  Precautions/Special Needs:   Fall and back precautions  Additional Precautions/Restrictions: Precautions Precautions: Back Precaution Booklet Issued: No Precaution Comments: pt able to independently recall 3/3 back precautions. Required Braces or Orthoses: Yes Spinal Brace: Thoracolumbosacral orthotic;Applied in supine position Restrictions Weight Bearing Restrictions: Yes LLE Weight Bearing: Non weight bearing Other Position/Activity Restrictions: none  Therapy Assessment Cognition Arousal/Alertness: Awake/alert Overall Cognitive Status: Appears within functional limits for tasks assessed Orientation Level: Oriented X4    Home Living Lives With: Spouse Receives Help From: Family (daughter lives next door) Type of Home: House Home Layout: One level Home Access: Level entry;Other (comment) (threshold) Bathroom Shower/Tub: Walk-in shower;Curtain Bathroom Toilet: Handicapped height Bathroom Accessibility: Yes How Accessible: Accessible via wheelchair;Accessible via walker Home Adaptive Equipment: Built-in shower seat;Hand-held shower hose;Grab bars in shower;Straight cane;Walker - four wheeled;Walker - rolling;Wheelchair - manual Additional Comments: unsure if legrests elevate on w/c Sensation Light Touch: Appears Intact (unable to test Lt  hand secondary to dressing) Light Touch Impaired Details: Impaired LLE Additional Comments: decr to light touch to Lt great toe (all others intact)  DME is wife's  Prior Function: Prior Function Level of Independence: Independent with basic ADLs;Independent with homemaking with ambulation Driving: Yes Vocation: Full time employment Office manager)  ADLs/Mobility:Current Functional Level: ADL Eating/Feeding: Performed;Independent Where Assessed - Eating/Feeding: Chair Grooming: Simulated;Wash/dry face;Set up Where Assessed - Grooming: Sitting, chair Upper Body Dressing: Performed;Maximal assistance Upper Body Dressing Details (indicate cue type and reason): able to provide inst. for caregiver to don brace properly with max a  Where Assessed - Upper Body Dressing: Supine, head of bed flat;Rolling right and/or left Toilet Transfer: Performed;Minimal assistance Toilet Transfer Details (indicate cue type and reason): Pt. able to maintain LLE NWB status  Toilet Transfer Method: Ambulating Toilet Transfer Equipment: Bedside commode Toileting - Clothing Manipulation: Performed;Minimal assistance Where Assessed - Glass blower/designer Manipulation: Standing Toileting - Hygiene: Performed;Minimal assistance Where Assessed - Toileting Hygiene: Sit on 3-in-1 or toilet Equipment Used: Rolling walker  Bed Mobility Bed Mobility: Yes Rolling Right: 5: Supervision;With rail Rolling Right Details (indicate cue type and reason): min verbal cues to prevent twisting with rolling Rolling Left: 5: Supervision;With rail Rolling Left Details (indicate cue type and reason): min verbal cues to prevent twisting with rolling Right Sidelying to Sit: 4: Min assist;With rails;HOB  flat Right Sidelying to Sit Details (indicate cue type and reason): min cues (verbal/tactile) to maintain neutral spine with sitting up and to move left lower ext. off edge of bed Left Sidelying to Sit: 4: Min assist;With rails;HOB  flat Left Sidelying to Sit Details (indicate cue type and reason): Assist needed to help lower LLE off of bed as patient sat up Sitting - Scoot to Edge of Bed: 5: Supervision Sitting - Scoot to Edge of Bed Details (indicate cue type and reason): increased time needed and assist to move/position left lower ext while pt scooted fwd to edge of bed Sit to Supine - Right: Not Tested (comment) Sit to Supine - Right Details (indicate cue type and reason): required assist for LLE Sit to Supine - Left: 4: Min assist;HOB flat Sit to Supine - Left Details (indicate cue type and reason): Assist to left LE to raise to bed safely. Transfers Transfers: No Sit to Stand: 4: Min assist;From bed Sit to Stand Details (indicate cue type and reason): pt. c/o of dizziness upon supine to sit at EOB and stated this did not worsen upon standing Stand to Sit: 4: Min assist;To chair/3-in-1;With armrests Stand to Sit Details: cues for safe hand placement, to use arms to control descent into chair and assist needed to slow descent with sitting down. Ambulation/Gait Ambulation/Gait: Yes Ambulation/Gait Assistance: 4: Min assist Ambulation/Gait Assistance Details (indicate cue type and reason): cues for RW use/manuevering. pt able to take "hop" steps fwd/bwd at edge of bed (30ft each way) Ambulation Distance (Feet): 8 Feet (32ft fwd, 39ft bwd) Assistive device: Rolling walker Gait Pattern: Step-to pattern;Decreased step length - right Gait velocity:  (maintained NWB on LLE) Stairs: No Wheelchair Mobility Wheelchair Mobility: No Posture/Postural Control Posture/Postural Control: No significant limitations Balance Balance Assessed: No  Home Assistive Devices/Equipment:  Home Assistive Devices/Equipment Home Assistive Devices/Equipment: None DME is Wife's  Discharge Planning:  Discharge Planning Living Arrangements: Spouse/significant other Support Systems: Spouse/significant other;Family members Do you have any  problems obtaining your medications?: No Type of Residence: Private residence Home Care Services: No Case Management Consult Needed: Yes (Comment) (in pt rehab)      Previous Home Environment:  Previous Home Environment Living Arrangements: Spouse/significant other Support Systems: Spouse/significant other;Family members Do you have any problems obtaining your medications?: No Type of Residence: Private residence Home Care Services: No Home Environment Number of Levels: one level Previous Home Environment Number of Steps: level threshold Previous Home Environment Is Bedroom on Main Floor?: Yes Previous Home Environment Is Bathroom on Main Floor?: Yes   Discharge Living Setting:  Discharge Living Setting Plans for Discharge Living Setting: Patient's home Discharge Living Setting Number of Levels: one level Discharge Living Setting Number of Steps: level Discharge Living Setting is Bedroom on Main Floor?: Yes Discharge Living Setting is Bathroom on Main Floor?: Yes  Social/Family/Support Systems:  Social/Family/Support Systems Patient Roles: Spouse;Parent;Other (Comment) (employee) Contact Information: Ayo Smoak Anticipated Caregiver: wife Anticipated Caregiver's Contact Information: [phone] Ability/Limitations of Caregiver: supervision only due to her MS Caregiver Availability: 24/7 Discharge Plan Discussed with Primary Caregiver: Yes Is Caregiver In Agreement with Plan?: Yes Does Caregiver/Family have Issues with Lodging/Transportation while Pt is in Rehab?: No  Goals/Additional Needs:  Goals/Additional Needs Patient/Family Goal for Rehab: Mod I to supervision with P.T. and O.T. Pt/Family Agrees to Admission and willing to participate: Yes Program Orientation Provided & Reviewed with Pt/Caregiver Including Roles  & Responsibilities: Yes  Preadmission Screen Completed By:  Clois Dupes, 04/27/2011 2:37 PM  Patient's condition:  Please see physician  update to information in consult dated 04/20/2011.  Preadmission Screen Competed by:Ottie Glazier, RN, Time/Date,04/27/2011 at [phone].  Discussed status with Dr. Wynn Banker on 04/27/11 at 1439 and received telephone approval for admission today.  Admission Coordinator:  Clois Dupes, time 9604 Date 04/27/2011.  Marland Kitchen

## 2011-04-23 NOTE — Anesthesia Postprocedure Evaluation (Signed)
  Anesthesia Post-op Note  Patient: Norman Pearson  Procedure(s) Performed:  OPEN REDUCTION INTERNAL FIXATION (ORIF) TIBIA FRACTURE - Removal of External Fixator, Open reduction internal fixation Left Tibial Pilon Fracture   Patient Location: PACU  Anesthesia Type: General  Level of Consciousness:  Airway and Oxygen Therapy: Patient Spontanous Breathing and Patient connected to nasal cannula oxygen  Post-op Pain: mild  Post-op Assessment: Post-op Vital signs reviewed, Patient's Cardiovascular Status Stable, Respiratory Function Stable, Patent Airway, No signs of Nausea or vomiting and Pain level controlled  Post-op Vital Signs: Reviewed and stable  Complications: No apparent anesthesia complications

## 2011-04-23 NOTE — Interval H&P Note (Signed)
History and Physical Interval Note:  04/23/2011 12:52 PM  Norman Pearson  has presented today for surgery, with the diagnosis of left tibial pilon fracture   The various methods of treatment have been discussed with the patient and family. After consideration of risks, benefits and other options for treatment, the patient has consented to  Procedure(s): OPEN REDUCTION INTERNAL FIXATION (ORIF) TIBIA FRACTURE as a surgical intervention .  The patients' history has been reviewed, patient examined, no change in status, stable for surgery.  I have reviewed the patients' chart and labs.  Questions were answered to the patient's satisfaction.     [name]

## 2011-04-23 NOTE — Progress Notes (Signed)
Physical Therapy Treatment Patient Details Name: Norman Pearson MRN: [id number] DOB: May 10, 1951 Today's Date: 04/23/2011  PT Assessment/Plan  PT - Assessment/Plan Comments on Treatment Session: Brace donned in supine with total assist.  Pt able to verbalize sequence.  Limited treatment secondary to pt for surgery later this pm. PT Plan: Discharge plan remains appropriate;Frequency remains appropriate PT Frequency: Min 5X/week Follow Up Recommendations: Inpatient Rehab Equipment Recommended: Defer to next venue PT Goals  Acute Rehab PT Goals PT Goal Formulation: With patient Time For Goal Achievement: 2 weeks Pt will Roll Supine to Right Side: with modified independence PT Goal: Rolling Supine to Right Side - Progress: Met Pt will Roll Supine to Left Side: with modified independence PT Goal: Rolling Supine to Left Side - Progress: Met Pt will go Supine/Side to Sit: with supervision;with HOB 0 degrees;with cues (comment type and amount) PT Goal: Supine/Side to Sit - Progress: Progressing toward goal Pt will go Sit to Supine/Side: with supervision;with HOB 0 degrees;with cues (comment type and amount) PT Goal: Sit to Supine/Side - Progress: Progressing toward goal Pt will Transfer Sit to Stand/Stand to Sit: with supervision;with upper extremity assist PT Transfer Goal: Sit to Stand/Stand to Sit - Progress: Progressing toward goal Pt will Transfer Bed to Chair/Chair to Bed: with supervision PT Transfer Goal: Bed to Chair/Chair to Bed - Progress: Progressing toward goal Pt will Ambulate: 1 - 15 feet;with supervision;with least restrictive assistive device PT Goal: Ambulate - Progress: Progressing toward goal Pt will Propel Wheelchair: 51 - 150 feet;with supervision;Other (comment) PT Goal: Propel Wheelchair - Progress: Progressing toward goal  PT Treatment Precautions/Restrictions  Precautions Precautions: Back Precaution Booklet Issued: No Precaution Comments: Pt able to recall  3/3 back precautions independently. Required Braces or Orthoses: Yes Spinal Brace: Thoracolumbosacral orthotic;Applied in supine position Restrictions Weight Bearing Restrictions: Yes LLE Weight Bearing: Non weight bearing Other Position/Activity Restrictions: external fixator LLE Pain 4/10 in back with treatment.  RN aware and pain medication given during treatment. Mobility (including Balance) Bed Mobility Bed Mobility: Yes Rolling Right: 6: Modified independent (Device/Increase time);With rail Rolling Left: 6: Modified independent (Device/Increase time);With rail Right Sidelying to Sit: Not tested (comment) Left Sidelying to Sit: 4: Min assist;HOB flat;With rails (Min (guard) for left LE safe lowering.) Left Sidelying to Sit Details (indicate cue type and reason): Guarding only to left LE to safely lower. Sitting - Scoot to Edge of Bed: 6: Modified independent (Device/Increase time) Sit to Supine - Right: Not Tested (comment) Sit to Supine - Left: 4: Min assist;HOB flat Sit to Supine - Left Details (indicate cue type and reason): Assist to left LE to raise to bed safely. Transfers Transfers: Yes Sit to Stand: 4: Min assist;From bed;With upper extremity assist Sit to Stand Details (indicate cue type and reason): Assist secondary to pt raising up from low surface.  Cues for hand placement. Stand to Sit: 5: Supervision;To bed;With upper extremity assist Stand to Sit Details: Cues for hand and left LE placement. Ambulation/Gait Ambulation/Gait: Yes Ambulation/Gait Assistance: 4: Min assist (Min (guard)) Ambulation/Gait Assistance Details (indicate cue type and reason): Guarding for balance and safety.  Cues to extend trunk fully. Ambulation Distance (Feet): 30 Feet (Distance limited by pt due to surgery this pm.) Assistive device: Rolling walker Gait Pattern: Step-to pattern;Decreased step length - right Stairs: No Wheelchair Mobility Wheelchair Mobility: No  Posture/Postural  Control Posture/Postural Control: No significant limitations Balance Balance Assessed: No Exercise    End of Session PT - End of Session Equipment Utilized During Treatment:  Gait belt;Back brace Activity Tolerance: Patient tolerated treatment well Patient left: in bed;with call bell in reach Nurse Communication: Mobility status for ambulation General Behavior During Session: [facility] for tasks performed Cognition: [facility] for tasks performed  Cephus Shelling 04/23/2011, 1:21 PM  04/23/2011 Cephus Shelling, PT, DPT [phone]

## 2011-04-23 NOTE — Progress Notes (Signed)
This patient has been seen and I agree with the findings and treatment plan.  Likely tomorrow after OR for CIR.  Marta Lamas. Gae Bon, MD, FACS [phone] (pager) [phone] (direct pager) Trauma Surgeon

## 2011-04-23 NOTE — Preoperative (Signed)
Beta Blockers   Reason not to administer Beta Blockers:Not Applicable 
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
## 2011-04-23 NOTE — Transfer of Care (Signed)
Immediate Anesthesia Transfer of Care Note  Patient: Norman Pearson  Procedure(s) Performed:  OPEN REDUCTION INTERNAL FIXATION (ORIF) TIBIA FRACTURE - Removal of External Fixator, Open reduction internal fixation Left Tibial Pilon Fracture   Patient Location: PACU  Anesthesia Type: General  Level of Consciousness: awake, alert , oriented and patient cooperative  Airway & Oxygen Therapy: Patient Spontanous Breathing and Patient connected to nasal cannula oxygen  Post-op Assessment: Report given to PACU RN and Post -op Vital signs reviewed and stable  Post vital signs: Reviewed and stable  Complications: No apparent anesthesia complications

## 2011-04-24 ENCOUNTER — Encounter (HOSPITAL_COMMUNITY): Payer: Self-pay | Admitting: Orthopedic Surgery

## 2011-04-24 LAB — URINALYSIS, ROUTINE W REFLEX MICROSCOPIC
Bilirubin Urine: NEGATIVE
Glucose, UA: NEGATIVE mg/dL
Ketones, ur: NEGATIVE mg/dL
Leukocytes, UA: NEGATIVE
Protein, ur: NEGATIVE mg/dL

## 2011-04-24 LAB — CBC
Hemoglobin: 9.8 g/dL — ABNORMAL LOW (ref 13.0–17.0)
MCH: 31.3 pg (ref 26.0–34.0)
MCHC: 33.9 g/dL (ref 30.0–36.0)
MCV: 92.3 fL (ref 78.0–100.0)

## 2011-04-24 MED ORDER — PROMETHAZINE HCL 25 MG PO TABS: 25.0000 mg | ORAL_TABLET | Freq: Three times a day (TID) | ORAL | Status: DC | PRN

## 2011-04-24 NOTE — Progress Notes (Signed)
Trauma Service Note  Subjective: The patient had a rough evening with urinary retention and significant pain.  Seems a bit better today although he has a catheter, but wary of going to Rehab today.  Has not been seen by therapist today.  Objective: Vital signs in last 24 hours: Temp:  [97.1 F (36.2 C)-99 F (37.2 C)] 98.9 F (37.2 C) (11/30 0600) Pulse Rate:  [95-118] 110  (11/30 0600) Resp:  [17-22] 18  (11/30 0600) BP: (116-163)/(72-86) 140/82 mmHg (11/30 0600) SpO2:  [96 %-100 %] 100 % (11/30 0600) Last BM Date: 04/20/11  Intake/Output from previous day: 11/29 0701 - 11/30 0700 In: 2575 [P.O.:575; I.V.:2000] Out: 1700 [Urine:1650; Blood:50] Intake/Output this shift: Total I/O In: 240 [P.O.:240] Out: -   General: In bed, no acute distress but fearful of what may happen  Lungs: Clear  Abd: Slightly distended with hypoactive bowel sounds. No nausea or vomiting  Extremities: Has tingling and numbness in his left foot and leg since surgery.Good perfusion.  Can move his toes.  No passive tenderness  Neuro: Intact  Lab Results: CBC   Basename 04/24/11 0540 04/23/11 0843  WBC 8.9 8.2  HGB 9.8* 10.4*  HCT 28.9* 30.8*  PLT 305 285   BMET No results found for this basename: NA:2,K:2,CL:2,CO2:2,GLUCOSE:2,BUN:2,CREATININE:2,CALCIUM:2 in the last 72 hours PT/INR No results found for this basename: LABPROT:2,INR:2 in the last 72 hours ABG No results found for this basename: PHART:2,PCO2:2,PO2:2,HCO3:2 in the last 72 hours  Studies/Results: Dg Ankle 2 Views Left  04/23/2011  *RADIOLOGY REPORT*  Clinical Data: Fracture  LEFT ANKLE - 2 VIEW  Comparison: 04/15/2011  Findings: Multiple intraoperative spot images demonstrate placement of a plate and screws transfixing a markedly comminuted distal tibia fracture.  There is gross alignment of the fracture fragments.  No breakage or loosening of the hardware.  IMPRESSION: ORIF tibial fracture.  Original Report Authenticated By:  Donavan Burnet, M.D.   Dg C-arm Gt 120 Min  04/23/2011  CLINICAL DATA: ORIF   C-ARM GT 120 MIN  Fluoroscopy was utilized by the requesting physician.  No radiographic  interpretation.      Anti-infectives: Anti-infectives     Start     Dose/Rate Route Frequency Ordered Stop   04/23/11 0645   ceFAZolin (ANCEF) IVPB 2 g/50 mL premix        2 g 100 mL/hr over 30 Minutes Intravenous 60 min pre-op 04/23/11 1191 04/23/11 1455   04/14/11 2330   ceFAZolin (ANCEF) IVPB 1 g/50 mL premix        1 g 100 mL/hr over 30 Minutes Intravenous 3 times per day 04/14/11 2255 04/15/11 0651   04/14/11 1930   bacitracin 50,000 Units in sodium chloride irrigation 0.9 % 500 mL irrigation  Status:  Discontinued          As needed 04/14/11 2015 04/14/11 2154          Assessment/Plan: s/p Procedure(s): OPEN REDUCTION INTERNAL FIXATION (ORIF) TIBIA FRACTURE Continue foley due to Continue foley due to Acute urinary retention Will make sure the patient is on appropriate medications for his retention. Probably not suited to go to rehab until Monday Will likely try to remove Foley again on Sunday.  LOS: 10 days   Marta Lamas. Gae Bon, MD, FACS [phone] Trauma Surgeon 04/24/2011
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
## 2011-04-24 NOTE — Progress Notes (Signed)
Subjective: 1 Day Post-Op Procedure(s) (LRB): OPEN REDUCTION INTERNAL FIXATION (ORIF) TIBIA FRACTURE (Left) Patient reports pain as moderate.   Denies numbness, tingling or weakness in left foot.  Objective: Vital signs in last 24 hours: Temp:  [97.1 F (36.2 C)-99 F (37.2 C)] 98 F (36.7 C) (11/30 0953) Pulse Rate:  [95-118] 102  (11/30 0953) Resp:  [17-22] 18  (11/30 0953) BP: (128-163)/(72-86) 135/76 mmHg (11/30 0953) SpO2:  [96 %-100 %] 98 % (11/30 0953)  Intake/Output from previous day: 11/29 0701 - 11/30 0700 In: 2575 [P.O.:575; I.V.:2000] Out: 1700 [Urine:1650; Blood:50] Intake/Output this shift: Total I/O In: 360 [P.O.:360] Out: -    Basename 04/24/11 0540 04/23/11 0843  HGB 9.8* 10.4*    Basename 04/24/11 0540 04/23/11 0843  WBC 8.9 8.2  RBC 3.13* 3.33*  HCT 28.9* 30.8*  PLT 305 285   PE:  L foot with brisk cap refill at toes.  activ  PF/DF at toes.  Feels LT in toes.  Splint intact.  Assessment/Plan: 1 Day Post-Op Procedure(s) (LRB): OPEN REDUCTION INTERNAL FIXATION (ORIF) TIBIA FRACTURE (Left)  Continue NWB on LLE.  PT/OT/d/c planning by trauma team.  [name] 04/24/2011, 1:48 PM

## 2011-04-24 NOTE — Progress Notes (Signed)
This patient has been seen and I agree with the findings and treatment plan.  Adeliz Tonkinson O. Salih Williamson, III, MD, FACS [phone] (pager) [phone] (direct pager) Trauma Surgeon  
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
## 2011-04-24 NOTE — Progress Notes (Signed)
OT Cancellation Note  Treatment cancelled today due to:  Patient underwent surgery yesterday (04/24/11) and has no new activity orders. Will await updated activity orders prior to treating.   Glendale Chard    OTR/L Pager: [phone] 04/24/2011

## 2011-04-24 NOTE — Progress Notes (Signed)
Patient ID: Norman Pearson, male   DOB: Aug 19, 1950, 60 y.o.   MRN: [id number] Patient s/p lle orif. Lumbar spine stable. Left leg dysesthesias problematic. Urinary retention noted post op. Continue with therapies and mobilization.

## 2011-04-24 NOTE — Progress Notes (Signed)
An order was put in today to irrigate patient's foley, observed foley was draining well without any blood clot or any issue, so a second nurse opinion was sought and the charged nurse was also notified about the order.Irrigation was not done, because patient was doing well. bladder scan was also taken to make sure patient was not retaining any urine .Handed over to the night nurse to keep monitoring patient's output.

## 2011-04-25 LAB — URINE CULTURE
Colony Count: NO GROWTH
Culture  Setup Time: 201211301536
Culture: NO GROWTH

## 2011-04-25 MED ORDER — POLYETHYLENE GLYCOL 3350 17 G PO PACK
17.0000 g | PACK | Freq: Two times a day (BID) | ORAL | Status: DC | PRN
  Administered 2011-04-26: 17 g via ORAL
  Filled 2011-04-25 (×2): qty 1

## 2011-04-25 MED ORDER — LORAZEPAM 1 MG PO TABS
1.0000 mg | ORAL_TABLET | Freq: Every evening | ORAL | Status: DC | PRN
  Administered 2011-04-25 – 2011-04-26 (×2): 1 mg via ORAL
  Filled 2011-04-25 (×2): qty 1

## 2011-04-25 MED ORDER — LORAZEPAM 0.5 MG PO TABS
0.5000 mg | ORAL_TABLET | Freq: Every day | ORAL | Status: DC | PRN
  Administered 2011-04-26 – 2011-04-27 (×2): 0.5 mg via ORAL
  Filled 2011-04-25 (×2): qty 1

## 2011-04-25 NOTE — Progress Notes (Signed)
Physical Therapy Treatment Patient Details Name: Norman Pearson MRN: [id number] DOB: Jan 29, 1951 Today's Date: 04/25/2011  PT Assessment/Plan  PT - Assessment/Plan Comments on Treatment Session: progressed with mobility today with OOB and gait with RW since most recent surgery. decreased anxiousness today. Pt eager to get moving today. PT Plan: Discharge plan remains appropriate PT Frequency: Min 5X/week Follow Up Recommendations: Inpatient Rehab Equipment Recommended: Defer to next venue PT Goals  Acute Rehab PT Goals PT Goal: Rolling Supine to Right Side - Progress: Progressing toward goal PT Goal: Rolling Supine to Left Side - Progress: Progressing toward goal PT Goal: Supine/Side to Sit - Progress: Progressing toward goal PT Transfer Goal: Bed to Chair/Chair to Bed - Progress: Progressing toward goal PT Goal: Ambulate - Progress: Progressing toward goal  PT Treatment Precautions/Restrictions  Precautions Precautions: Back Precaution Booklet Issued: No Precaution Comments: pt able to independently recall 3/3 back precautions. Required Braces or Orthoses: Yes Spinal Brace: Thoracolumbosacral orthotic;Applied in supine position Restrictions Weight Bearing Restrictions: Yes LLE Weight Bearing: Non weight bearing Other Position/Activity Restrictions: none Mobility (including Balance) Bed Mobility Rolling Right: 5: Supervision;With rail Rolling Right Details (indicate cue type and reason): min verbal cues to prevent twisting with rolling Rolling Left: 5: Supervision;With rail Rolling Left Details (indicate cue type and reason): min verbal cues to prevent twisting with rolling Right Sidelying to Sit: 4: Min assist;With rails;HOB flat Right Sidelying to Sit Details (indicate cue type and reason): min cues (verbal/tactile) to maintain neutral spine with sitting up and to move left lower ext. off edge of bed Sitting - Scoot to Edge of Bed: 4: Min assist Sitting - Scoot to [place] of  Bed Details (indicate cue type and reason): increased time needed and assist to move/position left lower ext while pt scooted fwd to edge of bed Transfers Sit to Stand: 4: Min assist;From elevated surface;With upper extremity assist;From bed Sit to Stand Details (indicate cue type and reason): bed elevated to assit with standing up. cues for safe hand placement and for ant wt shifting to assist with standing. pt able to maintian nwb on left lowere ext. Stand to Sit: 4: Min assist;With upper extremity assist;To chair/3-in-1 Stand to Sit Details: cues for safe hand placement, to use arms to control descent into chair and assist needed to slow descent with sitting down. Ambulation/Gait Ambulation/Gait: Yes Ambulation/Gait Assistance: 4: Min assist Ambulation/Gait Assistance Details (indicate cue type and reason): cues for RW use/manuevering. pt able to take "hop" steps fwd/bwd at edge of bed (32ft each way) Ambulation Distance (Feet): 8 Feet (77ft fwd, 101ft bwd) Assistive device: Rolling walker Gait Pattern: Step-to pattern;Decreased step length - right Gait velocity:  (maintained NWB on LLE)  Posture/Postural Control Posture/Postural Control: No significant limitations Balance Balance Assessed: No Exercise    End of Session PT - End of Session Equipment Utilized During Treatment: Gait belt;Back brace Activity Tolerance: Patient tolerated treatment well Patient left: in chair;with call bell in reach;with family/visitor present Nurse Communication: Mobility status for transfers;Mobility status for ambulation;Weight bearing status General Behavior During Session: [facility] for tasks performed Cognition: [facility] for tasks performed  Sallyanne Kuster 04/25/2011, 12:26 PM  Sallyanne Kuster, PTA Office- [phone]

## 2011-04-25 NOTE — Progress Notes (Signed)
Agree with PA's note.  PO Ativan  Byrant Valent K. 04/25/2011 8:18 PM
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
## 2011-04-26 NOTE — Progress Notes (Signed)
3 Days Post-Op  Subjective: Continued pain somewhat better today, no bm still  Objective: Vital signs in last 24 hours: Temp:  [98.2 F (36.8 C)-99.4 F (37.4 C)] 98.3 F (36.8 C) (12/02 0600) Pulse Rate:  [103-107] 103  (12/02 0600) Resp:  [18-21] 20  (12/02 0600) BP: (124-142)/(72-80) 131/80 mmHg (12/02 0600) SpO2:  [93 %-97 %] 95 % (12/02 0600) Last BM Date: 04/20/11  Intake/Output from previous day: 12/01 0701 - 12/02 0700 In: -  Out: 2050 [Urine:2050] Intake/Output this shift: Total I/O In: 360 [P.O.:360] Out: -   Resp: clear to auscultation bilaterally Cardio: regular rate and rhythm, S1, S2 normal, no murmur, click, rub or gallop GI: soft, non-tender; bowel sounds normal; no masses,  no organomegaly  Lab Results:   [facility] 04/24/11 0540  WBC 8.9  HGB 9.8*  HCT 28.9*  PLT 305   BMET No results found for this basename: NA:2,K:2,CL:2,CO2:2,GLUCOSE:2,BUN:2,CREATININE:2,CALCIUM:2 in the last 72 hours PT/INR No results found for this basename: LABPROT:2,INR:2 in the last 72 hours ABG No results found for this basename: PHART:2,PCO2:2,PO2:2,HCO3:2 in the last 72 hours  Studies/Results: No results found.  Anti-infectives: Anti-infectives     Start     Dose/Rate Route Frequency Ordered Stop   04/23/11 0645   ceFAZolin (ANCEF) IVPB 2 g/50 mL premix        2 g 100 mL/hr over 30 Minutes Intravenous 60 min pre-op 04/23/11 7829 04/23/11 1455   04/14/11 2330   ceFAZolin (ANCEF) IVPB 1 g/50 mL premix        1 g 100 mL/hr over 30 Minutes Intravenous 3 times per day 04/14/11 2255 04/15/11 0651   04/14/11 1930   bacitracin 50,000 Units in sodium chloride irrigation 0.9 % 500 mL irrigation  Status:  Discontinued          As needed 04/14/11 2015 04/14/11 2154          Assessment/Plan: s/p Procedure(s): OPEN REDUCTION INTERNAL FIXATION (ORIF) TIBIA FRACTURE cont current pain meds Cont reg diet We discussed bm, he would like to try prune juice again today but  may need more that  Dc foley am tomorrow  LOS: 12 days    [facility] 04/26/2011

## 2011-04-27 ENCOUNTER — Inpatient Hospital Stay (HOSPITAL_COMMUNITY)
Admission: RE | Admit: 2011-04-27 | Discharge: 2011-05-07 | DRG: 462 | Disposition: A | Discharge status: Home Health | Payer: BC Managed Care – PPO | Source: Ambulatory Visit | Attending: Physical Medicine & Rehabilitation | Admitting: Physical Medicine & Rehabilitation

## 2011-04-27 DIAGNOSIS — R339 Retention of urine, unspecified: Secondary | ICD-10-CM | POA: Diagnosis present

## 2011-04-27 DIAGNOSIS — G5790 Unspecified mononeuropathy of unspecified lower limb: Secondary | ICD-10-CM | POA: Diagnosis present

## 2011-04-27 DIAGNOSIS — S82209A Unspecified fracture of shaft of unspecified tibia, initial encounter for closed fracture: Secondary | ICD-10-CM | POA: Diagnosis present

## 2011-04-27 DIAGNOSIS — W1789XA Other fall from one level to another, initial encounter: Secondary | ICD-10-CM

## 2011-04-27 DIAGNOSIS — B952 Enterococcus as the cause of diseases classified elsewhere: Secondary | ICD-10-CM | POA: Diagnosis not present

## 2011-04-27 DIAGNOSIS — R338 Other retention of urine: Secondary | ICD-10-CM | POA: Diagnosis not present

## 2011-04-27 DIAGNOSIS — S82402A Unspecified fracture of shaft of left fibula, initial encounter for closed fracture: Secondary | ICD-10-CM | POA: Diagnosis present

## 2011-04-27 DIAGNOSIS — S82109A Unspecified fracture of upper end of unspecified tibia, initial encounter for closed fracture: Secondary | ICD-10-CM

## 2011-04-27 DIAGNOSIS — S32012A Unstable burst fracture of first lumbar vertebra, initial encounter for closed fracture: Secondary | ICD-10-CM | POA: Diagnosis present

## 2011-04-27 DIAGNOSIS — N390 Urinary tract infection, site not specified: Secondary | ICD-10-CM | POA: Diagnosis not present

## 2011-04-27 DIAGNOSIS — S32009A Unspecified fracture of unspecified lumbar vertebra, initial encounter for closed fracture: Secondary | ICD-10-CM | POA: Diagnosis present

## 2011-04-27 DIAGNOSIS — D649 Anemia, unspecified: Secondary | ICD-10-CM | POA: Diagnosis present

## 2011-04-27 DIAGNOSIS — Z5189 Encounter for other specified aftercare: Principal | ICD-10-CM

## 2011-04-27 DIAGNOSIS — K5900 Constipation, unspecified: Secondary | ICD-10-CM | POA: Diagnosis present

## 2011-04-27 MED ORDER — TRAZODONE HCL 50 MG PO TABS
25.0000 mg | ORAL_TABLET | Freq: Every evening | ORAL | Status: DC | PRN
  Administered 2011-04-29: 25 mg via ORAL
  Filled 2011-04-27: qty 1

## 2011-04-27 MED ORDER — FLEET ENEMA 7-19 GM/118ML RE ENEM
1.0000 | ENEMA | Freq: Once | RECTAL | Status: AC | PRN
  Filled 2011-04-27: qty 1

## 2011-04-27 MED ORDER — DIPHENHYDRAMINE HCL 12.5 MG/5ML PO ELIX
12.5000 mg | ORAL_SOLUTION | Freq: Four times a day (QID) | ORAL | Status: DC | PRN
  Filled 2011-04-27: qty 10

## 2011-04-27 MED ORDER — BETHANECHOL CHLORIDE 25 MG PO TABS
50.0000 mg | ORAL_TABLET | Freq: Once | ORAL | Status: AC
  Administered 2011-04-27: 50 mg via ORAL
  Filled 2011-04-27: qty 2

## 2011-04-27 MED ORDER — PROMETHAZINE HCL 25 MG/ML IJ SOLN: 12.5000 mg | Freq: Four times a day (QID) | INTRAMUSCULAR | Status: DC | PRN

## 2011-04-27 MED ORDER — BISACODYL 10 MG RE SUPP
10.0000 mg | Freq: Every day | RECTAL | Status: DC | PRN
  Filled 2011-04-27: qty 1

## 2011-04-27 MED ORDER — ALUM & MAG HYDROXIDE-SIMETH 400-400-40 MG/5ML PO SUSP
30.0000 mL | ORAL | Status: DC | PRN
  Filled 2011-04-27: qty 30

## 2011-04-27 MED ORDER — SALINE SPRAY 0.65 % NA SOLN
1.0000 | NASAL | Status: DC | PRN
  Filled 2011-04-27: qty 44

## 2011-04-27 MED ORDER — METHOCARBAMOL 500 MG PO TABS
500.0000 mg | ORAL_TABLET | Freq: Four times a day (QID) | ORAL | Status: DC | PRN
  Administered 2011-04-28 – 2011-05-03 (×3): 500 mg via ORAL
  Filled 2011-04-27 (×3): qty 1

## 2011-04-27 MED ORDER — OXYCODONE HCL 5 MG PO TABS
15.0000 mg | ORAL_TABLET | ORAL | Status: DC | PRN
  Administered 2011-04-27: 10 mg via ORAL
  Administered 2011-04-27: 15 mg via ORAL
  Administered 2011-04-28: 20 mg via ORAL
  Administered 2011-04-28: 15 mg via ORAL
  Filled 2011-04-27: qty 3
  Filled 2011-04-27 (×2): qty 4
  Filled 2011-04-27: qty 3

## 2011-04-27 MED ORDER — TAMSULOSIN HCL 0.4 MG PO CAPS
0.8000 mg | ORAL_CAPSULE | Freq: Once | ORAL | Status: AC
  Administered 2011-04-27: 0.8 mg via ORAL
  Filled 2011-04-27: qty 2

## 2011-04-27 MED ORDER — TAMSULOSIN HCL 0.4 MG PO CAPS
0.8000 mg | ORAL_CAPSULE | Freq: Every day | ORAL | Status: DC
  Administered 2011-04-27: 0.8 mg via ORAL
  Filled 2011-04-27: qty 2

## 2011-04-27 MED ORDER — POLYETHYLENE GLYCOL 3350 17 G PO PACK
17.0000 g | PACK | Freq: Every day | ORAL | Status: DC
  Administered 2011-04-28 – 2011-04-29 (×2): 17 g via ORAL
  Filled 2011-04-27 (×5): qty 1

## 2011-04-27 MED ORDER — TAMSULOSIN HCL 0.4 MG PO CAPS
0.4000 mg | ORAL_CAPSULE | ORAL | Status: DC
  Administered 2011-04-28 – 2011-05-06 (×9): 0.4 mg via ORAL
  Filled 2011-04-27 (×9): qty 1

## 2011-04-27 MED ORDER — HYDROMORPHONE HCL PF 1 MG/ML IJ SOLN: 0.5000 mg | INTRAMUSCULAR | Status: DC | PRN

## 2011-04-27 MED ORDER — ENOXAPARIN SODIUM 40 MG/0.4ML ~~LOC~~ SOLN
40.0000 mg | SUBCUTANEOUS | Status: DC
  Filled 2011-04-27: qty 0.4

## 2011-04-27 MED ORDER — PANTOPRAZOLE SODIUM 40 MG PO TBEC
40.0000 mg | DELAYED_RELEASE_TABLET | Freq: Every day | ORAL | Status: DC
  Administered 2011-04-28 – 2011-05-06 (×9): 40 mg via ORAL
  Filled 2011-04-27 (×10): qty 1

## 2011-04-27 MED ORDER — ENOXAPARIN SODIUM 40 MG/0.4ML ~~LOC~~ SOLN
40.0000 mg | SUBCUTANEOUS | Status: DC
  Administered 2011-04-27 – 2011-05-06 (×10): 40 mg via SUBCUTANEOUS
  Filled 2011-04-27 (×11): qty 0.4

## 2011-04-27 MED ORDER — TRAMADOL HCL 50 MG PO TABS
50.0000 mg | ORAL_TABLET | Freq: Four times a day (QID) | ORAL | Status: DC | PRN
  Administered 2011-04-27 – 2011-04-28 (×3): 50 mg via ORAL
  Filled 2011-04-27 (×3): qty 1

## 2011-04-27 MED ORDER — PROMETHAZINE HCL 12.5 MG RE SUPP: 12.5000 mg | Freq: Four times a day (QID) | RECTAL | Status: DC | PRN

## 2011-04-27 MED ORDER — PROMETHAZINE HCL 12.5 MG PO TABS
12.5000 mg | ORAL_TABLET | Freq: Four times a day (QID) | ORAL | Status: DC | PRN
  Administered 2011-05-01: 12.5 mg via ORAL
  Filled 2011-04-27 (×2): qty 1

## 2011-04-27 MED ORDER — ACETAMINOPHEN 325 MG PO TABS
325.0000 mg | ORAL_TABLET | ORAL | Status: DC | PRN
  Administered 2011-04-28 – 2011-05-03 (×3): 650 mg via ORAL
  Filled 2011-04-27 (×4): qty 2

## 2011-04-27 MED ORDER — BETHANECHOL CHLORIDE 25 MG PO TABS
25.0000 mg | ORAL_TABLET | Freq: Four times a day (QID) | ORAL | Status: DC
  Filled 2011-04-27 (×2): qty 1

## 2011-04-27 MED ORDER — LORAZEPAM 0.5 MG PO TABS
1.0000 mg | ORAL_TABLET | Freq: Every evening | ORAL | Status: DC | PRN
  Administered 2011-04-27 – 2011-05-06 (×10): 1 mg via ORAL
  Filled 2011-04-27 (×6): qty 1
  Filled 2011-04-27: qty 2
  Filled 2011-04-27: qty 1

## 2011-04-27 MED ORDER — LORAZEPAM 0.5 MG PO TABS
0.5000 mg | ORAL_TABLET | Freq: Every day | ORAL | Status: DC | PRN
  Filled 2011-04-27 (×5): qty 1

## 2011-04-27 MED ORDER — GUAIFENESIN-DM 100-10 MG/5ML PO SYRP: 5.0000 mL | ORAL_SOLUTION | Freq: Four times a day (QID) | ORAL | Status: DC | PRN
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
## 2011-04-27 NOTE — H&P (Signed)
Physical Medicine and Rehabilitation Admission H&P Chief Complaint  Patient presents with  . L1 burst fracture and displaced L tibial pilon fx  : HPI: Norman Pearson is an 60 y.o. malewho fell 12 feet off a ladder on 04/14/11 with complaints of left ankle and low back pain. X rays with highly comminuted left distal tibial metaphysis fracture with intra-articular extension, mildly displaced promixal fibular fracture, central HNP C3-C4 and L1 burst fracture with 50% retropulsion into the canal. Patient taken to OR for I and D left hand laceration, closed reduction with placement of external fixator on LLE by Dr. Victorino Dike. Evaluated by Dr. Danielle Dess and patient underwent posterior decompression of L1 burst fracture with posterior fixation and arthrodesis T12-L2 on the same day. Post op NWB LLE and TLSO when HOB > 30 degrees.  Patient underwent ORIF left tibal fracture with removal of external fixator on 11/29 by Dr. Victorino Dike.  To continue NWB LLE.  Post op with recurrent urinary retention with bladder spasms and foley replaced.  Has had issues with constipation since surgery.  Foley removed today with inability to void despite bladder volume @1100cc , therefore foley replaced. Therapies ongoing and patient with complaints of dizziness with mobility.    Review of Systems  HENT: Positive for ear pain.   Eyes: Negative.   Respiratory: Negative.   Cardiovascular: Negative.   Gastrointestinal: Positive for constipation. Blood in stool: Retention with some complaints of penile discomfort.  Neurological: Positive for sensory change.  Psychiatric/Behavioral: The patient is nervous/anxious.    No past medical history on file. Past Surgical History  Procedure Date  . Other surgical history     tendon surgery  . External fixation leg 04/14/2011    Procedure: EXTERNAL FIXATION LEG;  Surgeon: [name], MD;  Location: [facility] OR;  Service: Orthopedics;  Laterality: Left;  External fixation left lower leg  . I&d  extremity 04/14/2011    Procedure: IRRIGATION AND DEBRIDEMENT EXTREMITY;  Surgeon: [name], MD;  Location: [facility] OR;  Service: Orthopedics;  Laterality: Left;  Repair of left hand laceration  . Orif tibia fracture 04/23/2011    Procedure: OPEN REDUCTION INTERNAL FIXATION (ORIF) TIBIA FRACTURE;  Surgeon: [name], MD;  Location: [facility] OR;  Service: Orthopedics;  Laterality: Left;  Removal of External Fixator, Open reduction internal fixation Left Tibial Pilon Fracture    Family history: Mother with lung CA.      Father with COPD.   Social History: Married And Manufacturing systems engineer. Wife with MS and can provide limited assist. Daughter lives next door and can check in past discharge. He reports that he quit smoking about 37 years ago. He does not have any smokeless tobacco history on file. He reports that he drinks a glass of wine or liquor 4-5 times a week. He reports that he does not use illicit drugs.  Allergies: No Known Allergies  Prior to Admission medications   Medication Sig Start Date End Date Taking? Authorizing Provider  acetaminophen (TYLENOL) 325 MG tablet Take 650 mg by mouth every 6 (six) hours as needed. Pain/fever    Yes Historical Provider, MD  aspirin 325 MG buffered tablet Take 650 mg by mouth daily as needed. pain    Yes Historical Provider, MD  MILK THISTLE PO Take 1 tablet by mouth daily.     Yes Historical Provider, MD    Scheduled Medications:     PRN MED's:      Home:     Functional History:    Functional  Status:  Mobility:          ADL:    Cognition:       There were no vitals taken for this visit.Marland Kitchen Physical Exam  Constitutional: He is oriented to person, place, and time. He appears well-developed and well-nourished.  HENT:  Head: Normocephalic and atraumatic.  Right Ear: Hearing normal.  Left Ear: Hearing normal.  Nose: Nose normal.  Mouth/Throat: Oropharynx is clear and moist and mucous membranes are normal. Normal dentition.    Eyes: Pupils are equal, round, and reactive to light.  Neck: Normal range of motion.  Cardiovascular: Normal rate and regular rhythm.   Pulmonary/Chest: Effort normal and breath sounds normal.  Abdominal: Soft. He exhibits no distension. There is tenderness (Mild tenderness with palpation.).  Neurological: He is alert and oriented to person, place, and time. A sensory deficit (Numbness right thigh.  Tingling left toes.) is present.  Skin: Skin is warm and dry.       Back incision clean, dry and intact.  Psychiatric: He has a normal mood and affect. Judgment normal.   No results found for this or any previous visit (from the past 48 hour(s)). No results found.  Post Admission Physician Evaluation: 1. Functional deficits secondary  to L1 burst fracture and left tibial pilon fracture after a fall from a ladder. 2. Patient admitted to receive collaborative, interdisciplinary care between the physiatrist, rehab nursing staff, and therapy team. 3. Patient's level of medical complexity and substantial therapy needs in context of that medical necessity cannot be provided at a lesser intensity of care. Patient has experienced substantial functional loss from his/her baseline. Upon functional assessment at the time of the preadmission screening, patient was Mobility:  Bed Mobility  Bed Mobility: Yes  Rolling Right: 5: Set up;With rail  Rolling Right Details (indicate cue type and reason): pillow placed medially to LLE to support in rolling  Rolling Left: 5: Set up;With rail  Rolling Left Details (indicate cue type and reason): to monitor LLE/external fixator  Right Sidelying to Sit: 3: Mod assist;HOB flat;With rails  Right Sidelying to Sit Details (indicate cue type and reason): vc for technique to maintain back precautions; assist with LLE  Left Sidelying to Sit: 4: Min assist;With rails;HOB flat  Left Sidelying to Sit Details (indicate cue type and reason): assist to lower LLE over EOB and  maintained NWB by having bed elevated towards ceiling (so feet did not reach the floor); assist then provided to raise torso as pt with difficulty getting LUE into position for pushing up  Sitting - Scoot to Edge of Bed: 5: Supervision  Sitting - Scoot to Edge of Bed Details (indicate cue type and reason): for safety and to monitor NWB LLE  Sit to Supine - Right: 4: Min assist  Sit to Supine - Right Details (indicate cue type and reason): required assist for LLE  Transfers  Transfers: Yes  Sit to Stand: 4: Min assist;With upper extremity assist;From elevated surface  Sit to Stand Details (indicate cue type and reason): pt. requested bed height to be elevated for easier sit/stand, encouragement for BUE pushing through bed, pt. opted to have one hand on walker, one hand on the bed. reviewed that walker may tip to one side  Stand to Sit: 4: Min assist;With upper extremity assist;With armrests  Stand to Sit Details: sit/stand from 3-n-1 and also recliner  Ambulation/Gait  Ambulation/Gait: Yes  Ambulation/Gait Assistance: 4: Min assist  Ambulation/Gait Assistance Details (indicate cue type and reason): pt  with excellent unweighting of RLE via UEs on RW; limited by dizziness  Ambulation Distance (Feet): 8 Feet  Assistive device: Rolling walker  Gait Pattern: Step-to pattern;Decreased step length - right  Stairs: No  Wheelchair Mobility  Wheelchair Mobility: No  ADL:  ADL  Grooming: Simulated;Wash/dry face;Set up  Where Assessed - Grooming: Sitting, chair  Upper Body Dressing: Performed;Maximal assistance  Upper Body Dressing Details (indicate cue type and reason): able to provide inst. for caregiver to don brace properly with max a  Where Assessed - Upper Body Dressing: Supine, head of bed flat;Rolling right and/or left  Toilet Transfer: Performed;Minimal assistance  Toilet Transfer Details (indicate cue type and reason): able to maintain NWB LLE during transfer to [facility]  Toilet Transfer  Method: Stand pivot  Toilet Transfer Equipment: Bedside commode  Toileting - Clothing Manipulation: Performed;Minimal assistance  Where Assessed - Glass blower/designer Manipulation: Standing  Toileting - Hygiene: Performed;Supervision/safety  Where Assessed - Toileting Hygiene: Sit on 3-in-1 or toilet  Equipment Used: Rolling walker  .  Upon most recent functional evaluation, patient was ADLs/Mobility:Current Functional Level:  ADL  Eating/Feeding: Performed;Independent  Where Assessed - Eating/Feeding: Chair  Grooming: Simulated;Wash/dry face;Set up  Where Assessed - Grooming: Sitting, chair  Upper Body Dressing: Performed;Maximal assistance  Upper Body Dressing Details (indicate cue type and reason): able to provide inst. for caregiver to don brace properly with max a  Where Assessed - Upper Body Dressing: Supine, head of bed flat;Rolling right and/or left  Toilet Transfer: Performed;Minimal assistance  Toilet Transfer Details (indicate cue type and reason): Pt. able to maintain LLE NWB status  Toilet Transfer Method: Ambulating  Toilet Transfer Equipment: Bedside commode  Toileting - Clothing Manipulation: Performed;Minimal assistance  Where Assessed - Glass blower/designer Manipulation: Standing  Toileting - Hygiene: Performed;Minimal assistance  Where Assessed - Toileting Hygiene: Sit on 3-in-1 or toilet  Equipment Used: Rolling walker  Bed Mobility  Bed Mobility: Yes  Rolling Right: 5: Supervision;With rail  Rolling Right Details (indicate cue type and reason): min verbal cues to prevent twisting with rolling  Rolling Left: 5: Supervision;With rail  Rolling Left Details (indicate cue type and reason): min verbal cues to prevent twisting with rolling  Right Sidelying to Sit: 4: Min assist;With rails;HOB flat  Right Sidelying to Sit Details (indicate cue type and reason): min cues (verbal/tactile) to maintain neutral spine with sitting up and to move left lower ext. off edge of bed    Left Sidelying to Sit: 4: Min assist;With rails;HOB flat  Left Sidelying to Sit Details (indicate cue type and reason): Assist needed to help lower LLE off of bed as patient sat up  Sitting - Scoot to Edge of Bed: 5: Supervision  Sitting - Scoot to Edge of Bed Details (indicate cue type and reason): increased time needed and assist to move/position left lower ext while pt scooted fwd to edge of bed  Sit to Supine - Right: Not Tested (comment)  Sit to Supine - Right Details (indicate cue type and reason): required assist for LLE  Sit to Supine - Left: 4: Min assist;HOB flat  Sit to Supine - Left Details (indicate cue type and reason): Assist to left LE to raise to bed safely.  Transfers  Transfers: No  Sit to Stand: 4: Min assist;From bed  Sit to Stand Details (indicate cue type and reason): pt. c/o of dizziness upon supine to sit at EOB and stated this did not worsen upon standing  Stand to Sit: 4: Min assist;To chair/3-in-1;With  armrests  Stand to Sit Details: cues for safe hand placement, to use arms to control descent into chair and assist needed to slow descent with sitting down.  Ambulation/Gait  Ambulation/Gait: Yes  Ambulation/Gait Assistance: 4: Min assist  Ambulation/Gait Assistance Details (indicate cue type and reason): cues for RW use/manuevering. pt able to take "hop" steps fwd/bwd at edge of bed (26ft each way)  Ambulation Distance (Feet): 8 Feet (2ft fwd, 33ft bwd)  Assistive device: Rolling walker  Gait Pattern: Step-to pattern;Decreased step length - right  Gait velocity: (maintained NWB on LLE)  Stairs: No  Wheelchair Mobility  Wheelchair Mobility: No 4. .  Judging by the patient's diagnosis, physical exam, and functional history, the patient has potential for functional progress which will result in measurable gains while on inpatient rehab.  These gains will be of substantial and practical use upon discharge in facilitating mobility and self-care at the household  level. 5. Physiatrist will provide 24 hour management of medical needs as well as oversight of the therapy plan/treatment and provide guidance as appropriate regarding the interaction of the two. 6. 24 hour rehab nursing will assist in the management of  bladder management, bowel management, safety, skin/wound care, disease management, medication administration, pain management and patient education  and help integrate therapy concepts, techniques,education, etc. 7. PT will assess and treat for: balance, endurance, locomotion, strength and transferring. Goals are: independent with assistive device. 8. OT will assess and treat for: bathing, dressing, strength, toileting and transferring .  Goals are: independent with increased time.  9. SLP will assess and treat for: Not applicable.  Goals are: N/A. 10. Case Management and Social Worker will assess and treat for psychological issues and discharge planning. 11. Team conference will be held weekly to assess progress toward goals and to determine barriers to discharge. 12.  Patient will receive at least 3 hours of therapy per day at least 5 days per week. 13. ELOS and Prognosis: 7-10 days excellent   Medical Problem List and Plan: 1. DVT Prophylaxis/Anticoagulation: Mechanical: Antiembolism stockings, thigh (TED hose) Bilateral lower extremities Sequential compression devices, below knee Bilateral lower extremities.  Ok to initiate Lovenox per Dr. Danielle Dess. 2. Pain Management: Off dilaudid since this am.  OxyContin d/c yesterday.  Monitor with prn oxycodone.  O 3. Mood: Motivated to get better.  Concerned about urinary retention.  Educated patient about neurogenic B &B as well as side effects of meds.  Patient Active Hospital Problem List: 4. Unstable burst fracture of first lumbar vertebra (04/14/2011)       Assessment: Decompressive lam with arthrodesis for stabilization on 11/20   Plan: TLSO if HOB greater than 30 degrees.  Routine back  precautions.  CIR level therapies as above. 5. Closed fracture of left fibula (04/14/2011)      Assessment: ORIF for repair tibial pilon fx 11/29.   Plan: Continue NWB LLE.  CIR level therapies as above. 6. Laceration of fourth finger of left hand (04/14/2011)      Assessment: repaired   Plan: Continue to monitor for healing. 7. Anemia associated with acute blood loss (04/15/2011)       Assessment: Continue to monitor hgb for now.  Add MVI for supplement due to constipation issues   Plan: Will check serial  H&H.  As constipation resolves may add iron supplement. 8. Hypocalcemia (04/15/2011)      Assessment: Noted to have a drop from 8.3 to 7.6.   Plan: Recheck calcium and albumin levels in am. Will as  supplement as indicated. 9.  Recurrent  urinary retention (04/27/2011)      Assessment: Due to neurogenic bladder with recurrent surgery, narcotic, immobility and CONSTIPATION.   Plan: Aggressive bowel program.  SSE today.  Will hold off on urecholine due to orthostatic symptoms.  Flomax inititated today and will need to monitor for worsening of orthostatisis. 10. Constipation:  Add miralax to dulcolax tabs.  SSE today.  Monitor narcotics intake.   Erick Colace 04/27/2011, 5:09 PM

## 2011-04-27 NOTE — Progress Notes (Addendum)
The nurse called [place] this morning after the Foley had been removed about 1:15 earlier with inability to void, and apparent bladder scan of >990cc.  This seems improbable if the catheter had been working prior to removal.  Charma Igo has seen the patient and will give him a trial of voiding before replacing the catheter.  This patient has been seen and I agree with the findings and treatment plan.  Marta Lamas. Gae Bon, MD, FACS [phone] (pager) [phone] (direct pager) Trauma Surgeon  ADDENDUM:  Foley catheter placed without event.  Easily passed but [place] used.  Patient had not voided and was feeling very uncomfortable.  1100cc drained from catheter after insertion.

## 2011-04-27 NOTE — Progress Notes (Signed)
Occupational Therapy Treatment Patient Details Name: Norman Pearson MRN: [id number] DOB: 1950/11/06 Today's Date: 04/27/2011  OT Assessment/Plan OT Assessment/Plan OT Plan: Discharge plan remains appropriate Follow Up Recommendations: Inpatient Rehab Equipment Recommended: Defer to next venue OT Goals ADL Goals Pt Will Transfer to Toilet: with modified independence ADL Goal: Toilet Transfer - Progress: Progressing toward goals Pt Will Perform Toileting - Clothing Manipulation: Independently;Standing ADL Goal: Toileting - Clothing Manipulation - Progress: Revised (modified due to lack of progress/goal met) Pt Will Perform Toileting - Hygiene: with modified independence;Sit to stand from 3-in-1/toilet (AE prn) ADL Goal: Toileting - Hygiene - Progress: Revised (modified due to lack of progress/goal met)  OT Treatment Precautions/Restrictions  Precautions Precautions: Back Spinal Brace: Thoracolumbosacral orthotic;Applied in supine position Restrictions Weight Bearing Restrictions: Yes LLE Weight Bearing: Non weight bearing   ADL ADL Eating/Feeding: Performed;Independent Where Assessed - Eating/Feeding: Chair Toilet Transfer: Performed;Minimal assistance Toilet Transfer Details (indicate cue type and reason): Pt. able to maintain LLE NWB status  Toilet Transfer Method: Ambulating Toilet Transfer Equipment: Bedside commode Toileting - Clothing Manipulation: Performed;Minimal assistance Where Assessed - Glass blower/designer Manipulation: Standing Toileting - Hygiene: Performed;Minimal assistance Where Assessed - Toileting Hygiene: Sit on 3-in-1 or toilet Mobility  Bed Mobility Rolling Left: 5: Supervision;With rail Left Sidelying to Sit: 4: Min assist;With rails;HOB flat Left Sidelying to Sit Details (indicate cue type and reason): Assist needed to help lower LLE off of bed as patient sat up Sitting - Scoot to Edge of Bed: 5: Supervision Transfers Sit to Stand: 4: Min  assist;From bed Sit to Stand Details (indicate cue type and reason): pt. c/o of dizziness upon supine to sit at EOB and stated this did not worsen upon standing Stand to Sit: 4: Min assist;To chair/3-in-1;With armrests Exercises General Exercises - Upper Extremity Chair Push Up: 5 reps;Both;Seated  End of Session OT - End of Session Equipment Utilized During Treatment: Gait belt;Back brace Activity Tolerance: Patient tolerated treatment well (slightly limited by dizziness) Patient left: in chair;with call bell in reach (with lunch tray set up) General Behavior During Session: [facility] for tasks performed Cognition: [facility] for tasks performed  Norman Pearson  04/27/2011, 12:46 PM

## 2011-04-27 NOTE — Progress Notes (Signed)
I am faxing [employer] insurance patient's post operative therapy notes from Saturday to seek approval to admit patient to CIR/inpatient acute rehabilitation today. Patient is in agreement. Please call me with any questions pager [phone].

## 2011-04-27 NOTE — Progress Notes (Signed)
Pt D/C'd to rehab @ 1630 per MD order. Rema Fendt, RN

## 2011-04-27 NOTE — Discharge Summary (Signed)
This patient has been seen and I agree with the findings and treatment plan. Rehab should benefit this patient well.  Marta Lamas. Gae Bon, MD, FACS [phone] (pager) [phone] (direct pager) Trauma Surgeon

## 2011-04-27 NOTE — Discharge Summary (Signed)
Physician Discharge Summary  Patient ID: Norman Pearson MRN: [id number] DOB/AGE: 1951-04-14 60 y.o.  Admit date: 04/14/2011 Discharge date: 04/27/2011  Discharge Diagnoses Patient Active Problem List  Diagnoses Date Noted  . Acute urinary retention 04/27/2011  . Anemia associated with acute blood loss 04/15/2011  . Hypocalcemia 04/15/2011  . Unstable burst fracture of first lumbar vertebra 04/14/2011  . Accidental fall from ladder 04/14/2011  . Closed fracture of left fibula 04/14/2011  . Laceration of fourth finger of left hand 04/14/2011  . Closed fracture of left distal tibia 04/14/2011    Consultants Dr. Victorino Dike for orthopedic surgery Dr. Danielle Dess for neurosurgery  Procedures CR left tib/fib fx with application of external fixator, closure of finger laceration by Dr. Victorino Dike T12-L2 posterior fusion s/p L1 burst fracture by Dr. Danielle Dess ORIF left tib/fib fx by Dr. Victorino Dike  HPI: 60 yo WM s/p fall from a ladder from about 12 feet. No LOC. Fell and landed on legs. Was able to call 911 himself. C/o LLE pain and back pain. Denies numbness, tingling, or paraesthesias in extremities. Otherwise very healthy. Brought in as a nontrauma code. Workup showed a bad L1 burst fracture and a comminuted left tibia and fibula fracture. He was admitted to the intensive care unit and Dr. Victorino Dike and Danielle Dess were consulted. He was taken to the operating room for reduction of his lower extremity fracture and application of an external fixator as well as closure of his finger laceration.   Hospital Course: Following his initial surgery we attempted to mobilize the patient in a TLSO brace. Though he was quite motivated he had extreme pain and neuropathic symptoms into his lower extremities. After several days without significa progress or improvement in his symptomatology he was taken back to surgery by Dr. Danielle Dess for fixation of his lumbar fracture. This helped tremendously for the patient's pain and neuropathy.  He was able to mobilize much better with physical and occupational therapy. During this time he had his Foley removed but developed urinary retention. It was replaced and the patient was started on an alpha blocker and Urecholine. It was taken back to the operating room for definitive fixation of his lower extremity. This went well. Inpatient rehabilitation was consulted and thought he would be a good candidate. His Foley was removed on the day of discharge but the patient continued to have urinary retention. The Foley was replaced with plans for followup with urology if it did not resolve during his rehabilitation stay. He was discharged there in good condition.     Current Meds:  . bethanechol  50 mg Oral Once  . bisacodyl  10 mg Oral Q1200  . docusate sodium  100 mg Oral BID  . feeding supplement  237 mL Oral BID  . gabapentin  100 mg Oral TID  . pantoprazole  40 mg Oral Q1200  . Tamsulosin HCl  0.8 mg Oral Once  acetaminophen, alum & mag hydroxide-simeth, bisacodyl, cyclobenzaprine, HYDROmorphone, LORazepam, LORazepam, menthol-cetylpyridinium, ondansetron (ZOFRAN) IV, ondansetron, oxyCODONE, phenol, polyethylene glycol, promethazine, sodium chloride  Follow-up Information    Follow up with [name], MD.   Contact information:   [address] [place] [address] [phone]       Follow up with Stefani Dama   Contact information:   [address], [address] [place] [address] [phone]          Signed: [name], PA-C Pager: [phone] General Trauma PA Pager: [phone]  04/27/2011, 3:04  PM

## 2011-04-27 NOTE — Progress Notes (Signed)
Physical Therapy Treatment Patient Details Name: Norman Pearson MRN: [id number] DOB: 23-Apr-1951 Today's Date: 04/27/2011  PT Assessment/Plan  PT - Assessment/Plan Comments on Treatment Session: Dizziness seemed to be pt's limiting factor in progression with mobility today.  Very motivated depsite dizziness.   PT Plan: Discharge plan remains appropriate PT Frequency: Min 5X/week Follow Up Recommendations: Inpatient Rehab Equipment Recommended: Defer to next venue PT Goals  Acute Rehab PT Goals PT Goal: Rolling Supine to Right Side - Progress: Progressing toward goal PT Goal: Rolling Supine to Left Side - Progress: Progressing toward goal PT Goal: Supine/Side to Sit - Progress: Progressing toward goal PT Goal: Ambulate - Progress: Progressing toward goal  PT Treatment Precautions/Restrictions  Precautions Precautions: Back Precaution Booklet Issued: No Precaution Comments: pt able to independently recall 3/3 back precautions. Required Braces or Orthoses: Yes Spinal Brace: Thoracolumbosacral orthotic;Applied in supine position Restrictions Weight Bearing Restrictions: Yes LLE Weight Bearing: Non weight bearing Other Position/Activity Restrictions: none Mobility (including Balance) Bed Mobility Rolling Right: With rail;6: Modified independent (Device/Increase time) Rolling Left: 6: Modified independent (Device/Increase time);With rail Left Sidelying to Sit: 4: Min assist Left Sidelying to Sit Details (indicate cue type and reason): (A) for LLE Sitting - Scoot to Edge of Bed: 5: Supervision Transfers Sit to Stand: 4: Min assist;From bed Sit to Stand Details (indicate cue type and reason): Bed height elevated.  (A) to stabililze & safety.  C/o of dizziness- unable to get BP reading while standing.  Had pt sit in recliner to get reading- BP was 103/71.  Dizziness improved but not completely resolve with seated break.   Stand to Sit: 4: Min assist;With upper extremity assist;To  bed;To chair/3-in-1 Stand to Sit Details: (A) to control descent into recliner.  cues for hand placement.   Ambulation/Gait Ambulation/Gait Assistance: Other (comment) (Min Guard (A)) Ambulation/Gait Assistance Details (indicate cue type and reason): (A) for safety due to dizziness.  Unable to increase distance due to dizziness.   Ambulation Distance (Feet): 3 Feet Assistive device: Rolling walker Gait Pattern: Step-to pattern;Decreased step length - right    Exercise    End of Session PT - End of Session Equipment Utilized During Treatment: Gait belt;Back brace Activity Tolerance: Other (comment) (limited by dizziness) Patient left: in chair;with call bell in reach;with family/visitor present General Behavior During Session: [facility] for tasks performed Cognition: [facility] for tasks performed  Lara Mulch 04/27/2011, 4:01 PM [phone]

## 2011-04-27 NOTE — Progress Notes (Signed)
Foley replaced by Dr. Danella Maiers this am. Pt immediately emptied 1100cc of urine. Rema Fendt, RN

## 2011-04-27 NOTE — Progress Notes (Signed)
Pt admitted to 4010 via stretcher. Pt transferred to our rehab bed with +2 staff. Pt oriented to room and bed controls. Pt aware that head of bed can not be up higher than 30 degrees without back brace on and Non weight bearing to Left Lower leg. Pt oriented to team conference, white board, therapy schedule, safety plan and video and general education book. Please see [facility] for details of head to toe assessment. Butterfly wish completed . Pt prefers to be called Lyda Perone. [place], Kailo Kosik Elon Jester

## 2011-04-27 NOTE — Progress Notes (Signed)
Insurance has approved pt to be admitted to CIR today.

## 2011-04-27 NOTE — Plan of Care (Addendum)
Overall Plan of Care [facility]) Patient Details Name: Norman Pearson MRN: [id number] DOB: 04/16/1951  Diagnosis:    Primary Diagnosis:   L1 Burst fx  <principal problem not specified> Co-morbidities: radiculopathy, neurogenic bowel/bladder, pain  Functional Problem List  Patient demonstrates impairments in the following areas: Balance, Bladder, Bowel, Endurance, Medication Management, Motor, Pain, Safety and Skin Integrity  Basic ADL's: bathing, dressing and toileting Advanced ADL's: not applicable  Transfers:  bed mobility, bed to chair, toilet, car and furniture Locomotion:  ambulation and wheelchair mobility  Additional Impairments:  Other  Anticipated Outcomes Item Anticipated Outcome  Eating/Swallowing  N/a - pt is independent  Basic self-care  Supervision to Minimal assist  Tolieting  Mod I  Bowel/Bladder  Patient will remain continent of bowel. Patient will have foley catheter removal and be able to void.  Transfers  Mod I  Locomotion  Mod I w/c, supervision gait  Communication    Cognition    Pain  Patient will have pain <2.  Safety/Judgment  Patient will remain free from injury during this admission.  Other  Patient's incision will be without infection. Patient will have no other skin breakdown.   Therapy Plan: PT Frequency: 1-2 X/day, 60-90 minutesOT 1-2x a day, 60- 90 minutes, 5-7 days a week OT Frequency: 1-2 X/day, 60-90 minutes     Team Interventions: Item RN PT OT SLP SW TR Other  Self Care/Advanced ADL Retraining   x      Neuromuscular Re-Education         Therapeutic Activities  x x   x   UE/LE Strength Training/ROM  x x   x   UE/LE Coordination Activities  x    x   Visual/Perceptual Remediation/Compensation         DME/Adaptive Equipment Instruction  x x   x   Therapeutic Exercise  x x   x   Balance/Vestibular Training  x x   x   Patient/Family Education x x x   x   Cognitive Remediation/Compensation         Functional Mobility Training   x x   x   Ambulation/Gait Training  x       Engineer, structural Propulsion/Positioning  x    x   Functional Tourist information centre manager Reintegration      x   Dysphagia/Aspiration Film/video editor         Bladder Management x        Bowel Management x        Disease Management/Prevention x        Pain Management x x       Medication Management x        Skin Care/Wound Management x        Splinting/Orthotics         Discharge Planning     x x   Psychosocial Support     x x                      Team Discharge Planning: Destination:  Home Projected Follow-up:  PT, OT and Outpatient Projected Equipment Needs:  Biomedical engineer involved in discharge planning:  Yes  MD ELOS: 10 days Medical Rehab Prognosis:  Excellent Assessment: Patient has been admitted to inpatient rehabilitation. He will he will receive physical therapy  at least one to 2 hours a day at least 5 days week to address mobility and adaptive equipment pain management technique safety with goals modified independent to occasional supervision. Occupational sit therapy will see the patient lease one to 2 hours a day at least 5 days a week a dressing self-care functional mobility safety appropriate technique and pain with goals of modified independent to occasional minimal assistance. M.D. in nursing will be working on pain management in this case as well as optimization of his bowel and bladder function.

## 2011-04-27 NOTE — Progress Notes (Signed)
Subjective: 4 Days Post-Op Procedure(s) (LRB): OPEN REDUCTION INTERNAL FIXATION (ORIF) TIBIA FRACTURE (Left) Patient reports pain as mild.    Objective: Vital signs in last 24 hours: Temp:  [98.6 F (37 C)-99.6 F (37.6 C)] 98.8 F (37.1 C) (12/03 0200) Pulse Rate:  [99-113] 108  (12/03 0200) Resp:  [16-20] 18  (12/03 0200) BP: (112-120)/(67-72) 120/70 mmHg (12/03 0200) SpO2:  [93 %-99 %] 93 % (12/03 0200)  Intake/Output from previous day: 12/02 0701 - 12/03 0700 In: 1080 [P.O.:1080] Out: 500 [Urine:500] Intake/Output this shift:    No results found for this basename: HGB:5 in the last 72 hours No results found for this basename: WBC:2,RBC:2,HCT:2,PLT:2 in the last 72 hours No results found for this basename: NA:2,K:2,CL:2,CO2:2,BUN:2,CREATININE:2,GLUCOSE:2,CALCIUM:2 in the last 72 hours No results found for this basename: LABPT:2,INR:2 in the last 72 hours  LLE splint intact.  PF adn DF toes.  Feels LT.  Brisk cap refill in toes.  Assessment/Plan: 4 Days Post-Op Procedure(s) (LRB): OPEN REDUCTION INTERNAL FIXATION (ORIF) TIBIA FRACTURE (Left) Up with therapy.  Ok for d/c to rehab anytime from my perspective.  Pt will need to f/u with me in clinic in 2 weeks.  Call [phone] for an appointment.  [name] 04/27/2011, 7:20 AM

## 2011-04-28 DIAGNOSIS — S82109A Unspecified fracture of upper end of unspecified tibia, initial encounter for closed fracture: Secondary | ICD-10-CM

## 2011-04-28 DIAGNOSIS — Z5189 Encounter for other specified aftercare: Secondary | ICD-10-CM

## 2011-04-28 DIAGNOSIS — W1789XA Other fall from one level to another, initial encounter: Secondary | ICD-10-CM

## 2011-04-28 LAB — CBC
HCT: 29.9 % — ABNORMAL LOW (ref 39.0–52.0)
MCV: 91.2 fL (ref 78.0–100.0)
RBC: 3.28 MIL/uL — ABNORMAL LOW (ref 4.22–5.81)
WBC: 8.3 10*3/uL (ref 4.0–10.5)

## 2011-04-28 LAB — COMPREHENSIVE METABOLIC PANEL
AST: 32 U/L (ref 0–37)
Albumin: 2.2 g/dL — ABNORMAL LOW (ref 3.5–5.2)
Alkaline Phosphatase: 89 U/L (ref 39–117)
BUN: 16 mg/dL (ref 6–23)
CO2: 27 mEq/L (ref 19–32)
Chloride: 103 mEq/L (ref 96–112)
Creatinine, Ser: 1 mg/dL (ref 0.50–1.35)
GFR calc non Af Amer: 79 mL/min — ABNORMAL LOW (ref >90)
Potassium: 4.1 mEq/L (ref 3.5–5.1)
Total Bilirubin: 0.6 mg/dL (ref 0.3–1.2)

## 2011-04-28 LAB — DIFFERENTIAL
Basophils Absolute: 0.1 10*3/uL (ref 0.0–0.1)
Basophils Relative: 1 % (ref 0–1)
Eosinophils Absolute: 0.3 10*3/uL (ref 0.0–0.7)
Monocytes Absolute: 0.7 10*3/uL (ref 0.1–1.0)
Monocytes Relative: 8 % (ref 3–12)
Neutro Abs: 6.2 10*3/uL (ref 1.7–7.7)

## 2011-04-28 MED ORDER — BENEPROTEIN PO POWD
1.0000 | Freq: Three times a day (TID) | ORAL | Status: DC
  Administered 2011-04-28 – 2011-05-01 (×5): 6 g via ORAL
  Filled 2011-04-28: qty 227

## 2011-04-28 MED ORDER — OXYCODONE HCL 5 MG PO TABS
15.0000 mg | ORAL_TABLET | ORAL | Status: DC | PRN
  Administered 2011-04-28 (×2): 10 mg via ORAL
  Administered 2011-04-28 – 2011-04-30 (×8): 15 mg via ORAL
  Administered 2011-04-30 (×2): 10 mg via ORAL
  Administered 2011-04-30 (×2): 15 mg via ORAL
  Administered 2011-04-30 – 2011-05-01 (×5): 10 mg via ORAL
  Administered 2011-05-01 (×3): 15 mg via ORAL
  Administered 2011-05-01: 10 mg via ORAL
  Administered 2011-05-02: 15 mg via ORAL
  Administered 2011-05-02: 10 mg via ORAL
  Administered 2011-05-02: 15 mg via ORAL
  Filled 2011-04-28: qty 2
  Filled 2011-04-28: qty 3
  Filled 2011-04-28: qty 1
  Filled 2011-04-28 (×2): qty 3
  Filled 2011-04-28: qty 2
  Filled 2011-04-28 (×8): qty 3
  Filled 2011-04-28: qty 1
  Filled 2011-04-28: qty 2
  Filled 2011-04-28: qty 3
  Filled 2011-04-28: qty 2
  Filled 2011-04-28: qty 3
  Filled 2011-04-28: qty 2
  Filled 2011-04-28 (×3): qty 3
  Filled 2011-04-28: qty 2
  Filled 2011-04-28: qty 3
  Filled 2011-04-28 (×2): qty 2
  Filled 2011-04-28: qty 3
  Filled 2011-04-28 (×2): qty 2
  Filled 2011-04-28: qty 3

## 2011-04-28 NOTE — Progress Notes (Signed)
Pt with constipation post operatively. Last BM 12-26. Pt given fleets enema per order. Pt tolerated procedure well. Pt had Med soft stool on bed pan.

## 2011-04-28 NOTE — Progress Notes (Signed)
Occupational Therapy Note  Patient Details  Name: Norman Pearson MRN: [id number] Date of Birth: 1950/09/24 Today's Date: 04/28/2011  1100-1155 - 55 Minutes Individual Therapy No complaints of pain  Focused skilled occupational therapy on toilet transfer from w/c to elevated toilet seat using grab bars with minimal assistance from therapist, grooming tasks seated at sink in w/c, and UE strengthening using theraband & performing w/c pushups. Encouraged patient to continue working on UE strengthening using techniques taught to improve all over strength, to help increase independence with transfers, and increase overall activity tolerance/endurance. Also discussed home set up with patient regarding w/c and r/w accessibility throughout household and for toilet transfers & shower transfers.    Reshma Hoey 04/28/2011, 12:27 PM

## 2011-04-28 NOTE — Progress Notes (Signed)
Social Work Assessment and Plan Assessment and Plan  Patient Name: Norman Pearson  [id number]'[id number] Date: 04/28/2011  Problem List:  Patient Active Problem List  Diagnoses  . Unstable burst fracture of first lumbar vertebra  . Accidental fall from ladder  . Closed fracture of left fibula  . Laceration of fourth finger of left hand  . Closed fracture of left distal tibia  . Anemia associated with acute blood loss  . Hypocalcemia  . Acute urinary retention    Past Medical History: No past medical history on file.  Past Surgical History:  Past Surgical History  Procedure Date  . Other surgical history     tendon surgery  . External fixation leg 04/14/2011    Procedure: EXTERNAL FIXATION LEG;  Surgeon: [name], MD;  Location: [facility] OR;  Service: Orthopedics;  Laterality: Left;  External fixation left lower leg  . I&d extremity 04/14/2011    Procedure: IRRIGATION AND DEBRIDEMENT EXTREMITY;  Surgeon: [name], MD;  Location: [facility] OR;  Service: Orthopedics;  Laterality: Left;  Repair of left hand laceration  . Orif tibia fracture 04/23/2011    Procedure: OPEN REDUCTION INTERNAL FIXATION (ORIF) TIBIA FRACTURE;  Surgeon: [name], MD;  Location: [facility] OR;  Service: Orthopedics;  Laterality: Left;  Removal of External Fixator, Open reduction internal fixation Left Tibial Pilon Fracture     Discharge Planning  Discharge Planning Living Arrangements:  (adult daughter lives next door to pt) Support Systems: Spouse/significant other;Children Do you have any problems obtaining your medications?: No Type of Residence: Private residence Home Care Services: No Patient expects to be discharged to:: home Expected Discharge Date:  (TBD) Case Management Consult Needed: Yes (Comment) (already following)  Social/Family/Support Systems Social/Family/Support Systems Anticipated Caregiver: daughter, Tacey Ruiz, also supportive - lives next door and works out ot town  Employment Status Employment  Status Employment Status: Employed Name of Employer: self-employed Designer, jewell[employer] Return to Work Plans: "as soon as I can";  he does have a person designated to complete two jobs he was presently working on: financially stable that he "can take it easy the next little bit" Fish farm manager Issues: none Guardian/Conservator: none  Abuse/Neglect Abuse/Neglect Assessment (Assessment to be complete while patient is alone) Physical Abuse: Denies Verbal Abuse: Denies Sexual Abuse: Denies Exploitation of patient/patient's resources: Denies Self-Neglect: Denies  Emotional Status Emotional Status Pt's affect, behavior adn adjustment status: pleasant, oriented, motivated gentleman here after an accidental fall at one of his job sites.  Denies any significant emotional distress, but is feeling a lot of frustration about the fall itself and the resulting functional limitations he now has.  "I just hate that I'm putting my family through all of this".  No s/s depression, therefore BDI screen deferred.  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 Pt/family expectations/goals: "just want to get  through this...be able to get back to  work and do things for myself"  [employer] Agencies: None Premorbid Home Care/DME Agencies: None Transportation available at discharge: yes  Discharge Visual merchandiser Resources: Media planner (specify) Herbalist) Financial Resources: Employment Financial Screen Referred: No Living Expenses: Database administrator Management: Patient Home Management: patient Patient/Family Preliminary Plans: home with wife (who has MS) who can only really provide supervision; adult daughter next door is supportive  Clinical Impression:  Pleasant and very motivated gentleman here after fall from ladder at work.  Frustrated with situation and "putting my family through this".  Family is very supportive, but wife does have MS (functionally independent but pt concerned about placing physical and emotional stress on her.)      Ezri Landers 04/28/2011

## 2011-04-28 NOTE — Patient Care Conference (Signed)
Inpatient RehabilitationTeam Conference Note Date: 04/28/2011   Time: 3:52pm    Patient Name: Norman Pearson      Medical Record Number: [id number]  Date of Birth: Jun 16, 1950 Sex: Male         Room/Bed: 4010/4010-01 Payor Info: Payor: BLUE CROSS BLUE SHIELD  Plan: BCBS [place] PPO  Product Type: *No Product type*     Admitting Diagnosis: lt burst fx lt tibial plat fx  Admit Date/Time:  04/27/2011  4:53 PM Admission Comments: No comment available   Primary Diagnosis:  <principal problem not specified> Principal Problem: <principal problem not specified>  Patient Active Problem List  Diagnoses Date Noted  . Acute urinary retention 04/27/2011  . Anemia associated with acute blood loss 04/15/2011  . Hypocalcemia 04/15/2011  . Unstable burst fracture of first lumbar vertebra 04/14/2011  . Accidental fall from ladder 04/14/2011  . Closed fracture of left fibula 04/14/2011  . Laceration of fourth finger of left hand 04/14/2011  . Closed fracture of left distal tibia 04/14/2011    Expected Discharge Date: Expected Discharge Date: 05/07/11  Team Members Present: Physician: Dr. Faith Rogue Case Manager Present: Melanee Spry, RN Social Worker Present: Amada Jupiter, LCSW PT Present: Karolee Stamps, PT OT Present: Mackie Pai, OT;Patricia Mat Carne, OT Other (Discipline and Name): Tora Duck, PPS Coordinator  RN Daryll Brod    Current Status/Progress Goal Weekly Team Focus  Medical   Multiple trauma with left tib-fib fracture as well as L1 burst fracture. Patient likely lumbar radiculopathy on the right. Apparently no myelopathic signs and exam.  Optimize pain control as well as mobility given above diagnoses  Voiding trial. Pain regimen was adjusted. Consider further adjustments to analgesic   Bowel/Bladder   Indwelling catheter. Contient of bowel  Contient of bowel and bladder.  Continue with flomax as scheduled. Monitor bowel and implement intervention if necessary   Swallow/Nutrition/  Hydration             ADL's   Min assist-UB, Total assist-LB  overall supervision to minimal assist   ADL retraining, education/training on AE, increasing activity tolerance/endurance   Mobility   mod-max A  mod I  activity tolerance, strength   Communication             Safety/Cognition/ Behavioral Observations            Pain   Oxy IR 15mg  q 3hrs prn  <3  Monitior effectiveness of medication and frequency of prn request   Skin   Lumbar incision with dermabond, OTA. Rash on back, utilizing microguard powder  No additional skin breakdown.  Assess with turn q 2hrs.      *See Interdisciplinary Assessment and Plan and progress notes for long and short-term goals  Barriers to Discharge: Pain management as well as weight bearing in back precautions    Possible Resolutions to Barriers:  Optimization of donning and doffing of brace education regarding technique    Discharge Planning/Teaching Needs:  Home with wife who suffers from MS (but is independent in overall mobility and function).  Daughter lives next door and works out of town but is very supportive.  TBD   Team Discussion: Problems w/ dizziness when up--to check orthostasis-use R ted hose.  Pain back & R leg-work on pain management.  To relieve constipation & work on decreasing urine retention [currently w/ foley].  Should be able to do short distance ambulation at d/c.   Revisions to Treatment Plan: none    Continued Need for Acute Rehabilitation  Level of Care: The patient requires daily medical management by a physician with specialized training in physical medicine and rehabilitation for the following conditions: Daily direction of a multidisciplinary physical rehabilitation program to ensure safe treatment while eliciting the highest outcome that is of practical value to the patient.: Yes Daily medical management of patient stability for increased activity during participation in an intensive rehabilitation regime.:  Yes Daily analysis of laboratory values and/or radiology reports with any subsequent need for medication adjustment of medical intervention for : Neurological problems;Post surgical problems  Brock Ra 04/29/2011, 9:33 AM

## 2011-04-28 NOTE — Progress Notes (Signed)
Patient ID: Norman Pearson, male   DOB: 29-Aug-1950, 60 y.o.   MRN: [id number] Subjective/Complaints: ,Review of Systems  Musculoskeletal: Positive for back pain.  Neurological: Positive for tremors and sensory change.  Right thigh tender.    Objective: Vital Signs: Blood pressure 126/78, pulse 107, temperature 99.5 F (37.5 C), temperature source Oral, resp. rate 18, height 6' (1.829 m), weight 76.7 kg (169 lb 1.5 oz), SpO2 97.00%. No results found.  Basename 04/28/11 0632  WBC 8.3  HGB 10.0*  HCT 29.9*  PLT 465*    Basename 04/28/11 0632  NA 138  K 4.1  CL 103  CO2 27  GLUCOSE 104*  BUN 16  CREATININE 1.00  CALCIUM 8.2*   CBG (last 3)  No results found for this basename: GLUCAP:3 in the last 72 hours  Wt Readings from Last 3 Encounters:  04/27/11 76.7 kg (169 lb 1.5 oz)  04/17/11 73.483 kg (162 lb)  04/17/11 73.483 kg (162 lb)    Physical Exam:  General appearance: alert Head: Normocephalic, without obvious abnormality, atraumatic Eyes: conjunctivae/corneas clear. PERRL, EOM's intact. Fundi benign. Ears: normal TM's and external ear canals both ears Nose: Nares normal. Septum midline. Mucosa normal. No drainage or sinus tenderness. Throat: lips, mucosa, and tongue normal; teeth and gums normal Neck: no adenopathy, no carotid bruit, no JVD, supple, symmetrical, trachea midline and thyroid not enlarged, symmetric, no tenderness/mass/nodules Back: symmetric, no curvature. ROM normal. No CVA tenderness. Resp: clear to auscultation bilaterally Cardio: regular rate and rhythm, S1, S2 normal, no murmur, click, rub or gallop GI: soft, non-tender; bowel sounds normal; no masses,  no organomegaly Extremities: no edema, redness or tenderness in the calves or thighs and no ulcers, gangrene or trophic changes Pulses: 2+ and symmetric Skin: Skin color, texture, turgor normal. No rashes or lesions Neurologic: Grossly normal except for right leg with pain inhibition weakness as  well as sensory loss and dysesthesias along antero-medial thigh. Incision/Wound: back wound clean and intact.  Left leg in splint and intact.  Left hand wound clean.   Assessment/Plan: 1. Functional deficits secondary to left distal tibial/proximal fibular fxs, L1 burst fx s/p decompression and fusion which require 3+ hours per day of interdisciplinary therapy in a comprehensive inpatient rehab setting. Physiatrist is providing close team supervision and 24 hour management of active medical problems listed below. Physiatrist and rehab team continue to assess barriers to discharge/monitor patient progress toward functional and medical goals. Mobility:         ADL:   Cognition: Cognition Orientation Level: Oriented X4 Cognition Orientation Level: Oriented X4   Medical Problem List and Plan:  1. DVT Prophylaxis/Anticoagulation: Mechanical: Antiembolism stockings, thigh (TED hose) Bilateral lower extremities  Sequential compression devices, below knee Bilateral lower extremities. Ok to initiate Lovenox per Dr. Danielle Dess.  2. Pain Management:I suspsect some of right thigh pain is neuropathic.  Didn't like oxy cr.  Wants oxy ir in 3 hour intervals.  Will try for now. Consider fentanyl patch. 3. Mood: Motivated to get better. Concerned about urinary retention. Educated patient about neurogenic B &B as well as side effects of meds.     4. Unstable burst fracture of first lumbar vertebra (04/14/2011)  Assessment: Decompressive lam with arthrodesis for stabilization on 11/20 Plan: TLSO if HOB greater than 30 degrees. Routine back precautions. CIR level therapies as above. 5. Closed fracture of left fibula (04/14/2011)  Assessment: ORIF for repair tibial pilon fx 11/29. Plan: Continue NWB LLE. CIR level therapies as above. 6. Laceration  of fourth finger of left hand (04/14/2011)  Assessment: repaired Plan: Continue to monitor for healing. 7. Anemia associated with acute blood loss (04/15/2011)   Assessment: Continue to monitor hgb for now. Add MVI for supplement due to constipation issues Plan: Will check serial H&H. As constipation resolves may add iron supplement. 8. Hypocalcemia (04/15/2011)  Plan: Level improved today. Will as supplement as indicated. 9. Recurrent urinary retention (04/27/2011)  Assessment: Due to neurogenic bladder with recurrent surgery, narcotic, immobility and CONSTIPATION. Continue foley for now. Plan: Aggressive bowel program. SSE with results.. Will hold off on urecholine due to orthostatic symptoms. Flomax inititated today and will need to monitor for worsening of orthostatisis.  10. Constipation: Add miralax to dulcolax tabs. SSE Monitor narcotics intake. 11. Dizziness: due to orthostasis vs med effects.  Monitor today.     LOS (Days) 1  SWARTZ,ZACHARY T 04/28/2011, 8:19 AM

## 2011-04-28 NOTE — Progress Notes (Signed)
Physical Therapy Session Note  Patient Details  Name: Norman Pearson MRN: [id number] Date of Birth: 1950-06-27  Today's Date: 04/28/2011 Time: 1100-1155 Time Calculation (min): 55 min  Precautions: Precautions Precautions: Back Required Braces or Orthoses: Yes Spinal Brace: Thoracolumbosacral orthotic;Applied in supine position Restrictions Weight Bearing Restrictions: Yes LLE Weight Bearing: Non weight bearing  Short Term Goals:    Skilled Therapeutic Interventions/Progress Updates:      General   Vital Signs Therapy Vitals Pulse Rate: 125  (with gait) Oxygen Therapy SpO2: 86 % O2 Device: None (Room air) Pulse Oximetry Type: Intermittent Pain Pain Assessment Pain Assessment: 0-10 Pain Score:   4 Pain Type: Acute pain Pain Location: Back Pain Orientation: Posterior Pain Descriptors: Sharp Pain Intervention(s): Medication (See eMAR);Repositioned Multiple Pain Sites: Yes 2nd Pain Site Pain Score: 4 Pain Location: Leg Pain Orientation: Left Mobility Bed Mobility Rolling Right: 4: Min assist Right Sidelying to Sit: 4: Min assist Locomotion  Ambulation Ambulation: Yes Ambulation/Gait Assistance: 1: +2 Total assist (second person followed with chair due to BP) Ambulation Distance (Feet): 40 Feet Assistive device: Rolling walker Ambulation/Gait Assistance Details: Verbal cues for safe use of DME/AE  Trunk/Postural Assessment     Balance     Exercises    Other Treatments    Therapy/Group: Individual Therapy  Julian Reil 04/28/2011, 4:19 PM

## 2011-04-28 NOTE — Progress Notes (Signed)
[facility] Individual Statement of Services  Patient Name:  Norman Pearson  Date:  04/28/2011  Welcome to the [facility].  Our goal is to provide you with an individualized program based on your diagnosis and situation, designed to meet your specific needs.  With this comprehensive rehabilitation program, you will be expected to participate in at least 3 hours of rehabilitation therapies Monday-Friday, with modified therapy programming on the weekends.  Your rehabilitation program will include the following services:  Physical Therapy (PT), Occupational Therapy (OT), 24 hour per day rehabilitation nursing, Therapeutic Recreaction (TR), Case Management (RN and Child psychotherapist), Rehabilitation Medicine, Nutrition Services and Pharmacy Services  Weekly team conferences will be held on  Tuesday  to discuss your progress.  Your RN Case Designer, television/film set will talk with you frequently to get your input and to update you on team discussions.  Team conferences with you and your family in attendance may also be held.  Depending on your progress and recovery, your program may change.  Your RN Case Estate agent will coordinate services and will keep you informed of any changes.  Your RN Sports coach and SW names and contact numbers are listed  below.  The following services may also be recommended but are not provided by the [facility]:   Driving Evaluations  Home Health Rehabiltiation Services  Outpatient Rehabilitatation [facility]  Vocational Rehabilitation   Arrangements will be made to provide these services after discharge if needed.  Arrangements include referral to agencies that provide these services.  Your insurance has been verified to be:  BC[employer] Your primary doctor is:  Dr. Mervyn Skeeters v v a  Pertinent information will be shared with your doctor and your insurance company.  Case Manager: Melanee Spry, [facility] [phone]    Social Worker:  Amada Jupiter, [place] [phone]  ELOS: @10  days  Goals: Supervision  Information discussed with and copy given to patient by: Brock Ra, 04/28/2011, 4:37 PM

## 2011-04-28 NOTE — Progress Notes (Signed)
Physical Therapy Assessment and Plan  Patient Details  Name: Norman Pearson MRN: [id number] Date of Birth: [birth year]  PT Diagnosis: Difficulty walking and Muscle weakness Rehab Potential: Good ELOS: 10 days   Today's Date: 04/28/2011 Time: 0930-1030 Time Calculation (min): 60 min  Assessment & Plan Clinical Impression: Patient is a 60 y.o. year old male with recent admission to the hospital after fall from a ladder on 04/14/11 with complaints of left ankle and low back pain. X rays with highly comminuted left distal tibial metaphysis fracture with intra-articular extension, mildly displaced promixal fibular fracture, central HNP C3-C4 and L1 burst fracture with 50% retropulsion into the canal. Patient taken to OR for I and D left hand laceration, closed reduction with placement of external fixator on LLE by Dr. Victorino Dike. Evaluated by Dr. Danielle Dess and patient underwent posterior decompression of L1 burst fracture with posterior fixation and arthrodesis T12-L2 on the same day. .  Patient transferred to CIR on 04/27/2011 .  No pertinent past medical history.    Patient currently requires mod - max assist with mobility secondary to muscle weakness and decreased cardiorespiratoy endurance, back precautions, NWB status.  Prior to hospitalization, patient was independent with mobility and lived with Spouse in a House home.  Home access is  Level entry.  Patient will benefit from skilled PT intervention to maximize safe functional mobility, minimize fall risk and decrease caregiver burden for planned discharge home with intermittent assist.  Anticipate patient will benefit from follow up OP at discharge.  PT - End of Session Activity Tolerance: Tolerates 30+ min activity with multiple rests Endurance Deficit: Yes PT Assessment Rehab Potential: Good Barriers to Discharge: Decreased caregiver support PT Plan PT Frequency: 1-2 X/day, 60-90 minutes Estimated Length of Stay: 10 days PT  Treatment/Interventions: Ambulation/gait training;Balance/vestibular training;Functional mobility training;Pain management;DME/adaptive equipment instruction;Patient/family education;UE/LE Coordination activities;UE/LE Strength taining/ROM;Therapeutic Exercise;Therapeutic Activities;Wheelchair propulsion/positioning PT Recommendation Follow Up Recommendations: Outpatient PT Equipment Recommended: Wheelchair (measurements);Rolling walker with 5" wheels  Precautions/Restrictions Precautions Precautions: Back Required Braces or Orthoses: Yes Spinal Brace: Thoracolumbosacral orthotic;Applied in supine position Restrictions Weight Bearing Restrictions: Yes LLE Weight Bearing: Non weight bearing  Vital Signs BP: 70/50 mmHg Patient Position, if appropriate: Standing  Pain Pain Assessment Pain Assessment: 0-10 Pain Score:   4 Pain Type: Surgical pain Pain Location: Back Pain Orientation: Lower Pain Descriptors: Aching Pain Onset: On-going Patients Stated Pain Goal: 1 Pain Intervention(s): RN made aware;Repositioned Home Living/Prior Functioning Home Living Lives With: Spouse Receives Help From: Family Type of Home: House Home Layout: One level Home Access: Level entry Bathroom Shower/Tub: Walk-in shower;Curtain Bathroom Toilet: Handicapped height Bathroom Accessibility: Yes How Accessible: Accessible via wheelchair;Accessible via walker Home Adaptive Equipment: Built-in shower seat;Hand-held shower hose;Grab bars in shower;Straight cane;Walker - four wheeled;Walker - rolling;Wheelchair - manual Prior Function Level of Independence: Independent with basic ADLs;Independent with transfers;Independent with gait;Independent with homemaking with ambulation Able to Take Stairs?: Yes Driving: Yes Vocation: Full time employment  Cognition Overall Cognitive Status: Appears within functional limits for tasks assessed Sensation Sensation Light Touch: Impaired Detail (R upper thigh  numb) Proprioception: Appears Intact Coordination Gross Motor Movements are Fluid and Coordinated: Yes Fine Motor Movements are Fluid and Coordinated: Yes Motor  Motor: Within Functional Limits  Mobility  Right Sidelying to Sit: 4: Min assist Right Sidelying to Sit Details: Manual facilitation for placement;Manual facilitation for weight shifting Sit to Supine - Right: 2: Max assist Sit to Supine - Right Details (indicate cue type and reason): lift B LEs into bed Transfers Transfers:  Yes Stand Pivot Transfers: 3: Mod assist Stand Pivot Transfer Details (indicate cue type and reason): lifting assist to stand, steadying assist for turning with RW Squat Pivot Transfers: 5: Supervision Squat Pivot Transfer Details (indicate cue type and reason): cues for technique Locomotion  Ambulation Ambulation/Gait Assistance Details (indicate cue type and reason): unable to assess due to orthostatic hypotension Wheelchair Mobility Wheelchair Mobility: Yes Wheelchair Assistance: 4: Administrator, sports Details:  (min A to turn in tight spaces) Occupational hygienist: Both upper extremities Wheelchair Parts Management: Needs assistance Distance: 150 feet  Trunk/Postural Assessment  Cervical Assessment Cervical Assessment: Within Functional Limits Thoracic Assessment Thoracic Assessment:  (TLSO) Lumbar Assessment Lumbar Assessment:  (TLSO) Postural Control Postural Control: Within Functional Limits  Balance Balance Balance Assessed: Yes Static Standing Balance Static Standing - Level of Assistance: 2: Max assist (without AD) Dynamic Standing Balance Dynamic Standing - Level of Assistance: 1: +1 Total assist (without AD) Extremity Assessment  RLE Assessment: Exceptions to [facility] RLE AROM (degrees) Overall AROM Right Lower Extremity: Due to decreased strength;Due to precautions;Due to pain RLE Strength RLE Overall Strength:  (3-/5) LLE Assessment LLE Assessment: Exceptions to  WFL LLE AROM Overall AROM Left Lower Extremity limited Due to decreased strength;Due to precautions;Due to pain LLE Strength LLE Overall Strength Comments: 3-/5 overall  Recommendations for other services: None  Discharge Criteria: Patient will be discharged from PT if patient refuses treatment 3 consecutive times without medical reason, if treatment goals not met, if there is a change in medical status, if patient makes no progress towards goals or if patient is discharged from hospital.  The above assessment, treatment plan, treatment alternatives and goals were discussed and mutually agreed upon: by patient  PT treatment [phone] 60 minutes.  Initial eval completed.  Pt with dizziness with position changes.  RN notified.  BP sitting 84/68, standing 70/50.  Pt able to perform stand pivot and squat pivot transfers with RW despite dizziness with min A SPT, Supervision for squat pivot.  Gait unsafe to attempt at this time.  Supine TE AAROM for increased LE strength and endurance.  W/c mobility in controlled environment with supervision straight path, min A for turns.  Pt with decreased activity tolerance and dizziness requiring frequent rest breaks.  Individual Therapy  Carolyn Maniscalco 04/28/2011, 10:54 AM

## 2011-04-28 NOTE — Progress Notes (Signed)
Patient information reviewed and entered into [employer] system by Khiara Shuping, RN, CRRN, PPS Coordinator.  Information including medical coding and functional independence measure will be reviewed and updated through discharge.    

## 2011-04-28 NOTE — Progress Notes (Signed)
Occupational Therapy Assessment and Plan and Therapy Session  Patient Details  Name: Norman Pearson MRN: [id number] Date of Birth: 1951/05/02  OT Diagnosis: muscle weakness (generalized) Rehab Potential: Rehab Potential: Good ELOS: 7 - 10 days   Today's Date: 04/28/2011 Time: 8469-6295 Time Calculation (min): 60 min  Treatment Session:  Pt seen for initial evaluation and ADL retraining with a focus on functional mobility, use of adaptive equipment, and maintenance of back and NWB precautions.  Pt became dizzy from sidelying to sit and sit to stand from bed.  Dizziness subsided  Within a few minutes.  Pt tolerated treatment well.  He stated his main goal was to use the bathroom independently, but he does not mind if his wife assists him with donning socks and shoes.  Assessment & Plan Clinical Impression: Patient is a 60 y.o. year old male with recent admission to the hospital on 04/14/11 after fall on ladder with complaints of left ankle and low back pain.  Patient transferred to CIR on 04/27/2011 .    X rays with highly comminuted left distal tibial metaphysis fracture with intra-articular extension, mildly displaced promixal fibular fracture, central HNP C3-C4 and L1 burst fracture with 50% retropulsion into the canal. Patient taken to OR for I and D left hand laceration, closed reduction with placement of external fixator on LLE by Dr. Victorino Dike. Evaluated by Dr. Danielle Dess and patient underwent posterior decompression of L1 burst fracture with posterior fixation and arthrodesis T12-L2 on the same day. Post op NWB LLE and TLSO when HOB > 30 degrees. Patient underwent ORIF left tibal fracture with removal of external fixator on 11/29 by Dr. Victorino Dike. To continue NWB LLE. Post op with recurrent urinary retention with bladder spasms and foley replaced. Has had issues with constipation since surgery. Foley removed today with inability to void despite bladder volume @1100cc , therefore foley replaced. Therapies  ongoing and patient with complaints of dizziness with mobility.  No significant PMH related to this admission.    Patient currently requires min assist with basic self-care skills, except for max assist with lower body dressing secondary to muscle weakness.  Prior to hospitalization, patient could complete all IADLs with independently.  Patient will benefit from skilled intervention to increase independence with basic self-care skills prior to discharge home with care partner.  Anticipate patient will require intermittent supervision and follow up home health.  OT - End of Session Activity Tolerance: Tolerates 10 - 20 min activity with multiple rests (needs rest breaks due to dizziness) OT Assessment Rehab Potential: Good Barriers to Discharge: None OT Plan OT Frequency: 1-2 X/day, 60-90 minutes Estimated Length of Stay: 7 - 10 days OT Treatment/Interventions: Balance/vestibular training;DME/adaptive equipment instruction;Patient/family education;Functional mobility training;Self Care/advanced ADL retraining;Therapeutic Activities;Therapeutic Exercise OT Recommendation Follow Up Recommendations: Home health OT  Precautions/Restrictions  Precautions Precautions: Back Required Braces or Orthoses: Yes Spinal Brace: Applied in supine position;Thoracolumbosacral orthotic Restrictions Weight Bearing Restrictions: Yes LLE Weight Bearing: Non weight bearing General Chart Reviewed: Yes Family/Caregiver Present: No   Pain Pain Assessment Pain Assessment: 0-10 Pain Score:   4 Pain Type: Surgical pain Pain Location: Back Pain Orientation: Lower Pain Descriptors: Aching Pain Onset: On-going Patients Stated Pain Goal: 1 Pain Intervention(s): RN made aware (premedicated) Home Living/Prior Functioning Home Living Lives With: Spouse Receives Help From: Family Type of Home: House Home Layout: One level Home Access: Level entry Bathroom Shower/Tub: Walk-in shower;Curtain Bathroom  Toilet: Handicapped height Bathroom Accessibility: Yes How Accessible: Accessible via wheelchair;Accessible via walker Home Adaptive Equipment: Built-in  shower seat;Hand-held shower hose;Grab bars in shower;Straight cane;Walker - four wheeled;Walker - rolling;Wheelchair - manual Prior Function Level of Independence: Independent with homemaking with ambulation Driving: Yes Vocation: Full time employment ADL ADL Equipment Provided: Reacher;Sock aid Eating: Independent Grooming: Setup Where Assessed-Grooming: Edge of bed Upper Body Bathing: Setup Where Assessed-Upper Body Bathing: Other (Comment);Bed level (supine for upper) Lower Body Bathing: Moderate assistance Where Assessed-Lower Body Bathing: Edge of bed Upper Body Dressing: Setup Where Assessed-Upper Body Dressing: Bed level Lower Body Dressing: Maximal assistance Where Assessed-Lower Body Dressing: Edge of bed Toilet Transfer: Minimal assistance Toilet Transfer Method: Stand pivot Acupuncturist: Grab bars Tub/Shower Transfer: Unable to assess Film/video editor: Unable to assess Vision/Perception  Vision - History Baseline Vision: Wears glasses only for reading Vision - Assessment Eye Alignment: Within Functional Limits  Cognition Overall Cognitive Status: Appears within functional limits for tasks assessed Sensation Sensation Light Touch: Appears Intact Coordination Gross Motor Movements are Fluid and Coordinated: Yes Fine Motor Movements are Fluid and Coordinated: Yes Motor  Motor Motor: Within Functional Limits Motor - Skilled Clinical Observations: BUE WFL Mobility  Bed Mobility Rolling Right: 6: Modified independent (Device/Increase time);With rail Rolling Left: 6: Modified independent (Device/Increase time);With rail Right Sidelying to Sit: 4: Min assist Right Sidelying to Sit Details: Manual facilitation for placement Sitting - Scoot to Edge of Bed: 5: Supervision Transfers Sit to  Stand: 4: Min assist Sit to Stand Details (indicate cue type and reason): c/o dizziness, min assist to steady balance Stand to Sit: 4: Min assist  Trunk/Postural Assessment  Cervical Assessment Cervical Assessment: Within Functional Limits Thoracic Assessment Thoracic Assessment:  (pt in TLSO) Lumbar Assessment Lumbar Assessment:  (pt in TLSO)  Balance Balance Balance Assessed: Yes Static Standing Balance Static Standing - Level of Assistance: 4: Min assist Extremity/Trunk Assessment RUE Assessment RUE Assessment: Within Functional Limits LUE Assessment LUE Assessment: Within Functional Limits  Recommendations for other services: None  Discharge Criteria: Patient will be discharged from OT if patient refuses treatment 3 consecutive times without medical reason, if treatment goals not met, if there is a change in medical status, if patient makes no progress towards goals or if patient is discharged from hospital.  The above assessment, treatment plan, treatment alternatives and goals were discussed and mutually agreed upon: by patient    Ceaira Ernster 04/28/2011, 10:05 AM

## 2011-04-29 DIAGNOSIS — Z5189 Encounter for other specified aftercare: Secondary | ICD-10-CM

## 2011-04-29 DIAGNOSIS — W1789XA Other fall from one level to another, initial encounter: Secondary | ICD-10-CM

## 2011-04-29 DIAGNOSIS — S82109A Unspecified fracture of upper end of unspecified tibia, initial encounter for closed fracture: Secondary | ICD-10-CM

## 2011-04-29 LAB — URINE CULTURE: Culture  Setup Time: 201212032018

## 2011-04-29 MED ORDER — MAGNESIUM CITRATE PO SOLN
1.0000 | Freq: Once | ORAL | Status: AC
  Administered 2011-04-29: 1 via ORAL
  Filled 2011-04-29: qty 296

## 2011-04-29 MED ORDER — GABAPENTIN 100 MG PO CAPS
100.0000 mg | ORAL_CAPSULE | Freq: Three times a day (TID) | ORAL | Status: DC
  Administered 2011-04-29 (×3): 100 mg via ORAL
  Filled 2011-04-29 (×7): qty 1

## 2011-04-29 MED ORDER — OXYCODONE HCL 5 MG PO TABS
15.0000 mg | ORAL_TABLET | Freq: Once | ORAL | Status: AC
  Administered 2011-04-29: 15 mg via ORAL

## 2011-04-29 NOTE — Progress Notes (Signed)
Physical Therapy Session Note  Patient Details  Name: Norman Pearson MRN: [id number] Date of Birth: 07-28-1950  Today's Date: 04/29/2011 Time: 6578-4696 Time Calculation (min): 60 min  Precautions: Precautions Precautions: Back Required Braces or Orthoses: Yes Spinal Brace: Thoracolumbosacral orthotic;Applied in sitting position Restrictions Weight Bearing Restrictions: Yes LLE Weight Bearing: Non weight bearing   Skilled Therapeutic Interventions: W/C mobility x 160' x2 with S; warm-up exs in w/c before gait training: bilateral hip resisted abduction/adduction, RLE LAQ with isometric contraction at full extension, LLE active assistive( AA) LAQ, toe flexion/extension( limited by cast) 10 x 1 each.  Gait training with RW x 35' with min A for safety due to dizziness, holding LLE out in front with hip flexion, dizziness and fatigue of LLE hip limiting distance.  Standing therex in parallel bars: LLE AA knee flexion, active hip abduction, 10 x 1 each.        General Chart Reviewed: Yes Vital Signs:  sitting BP before tx= 101/72;  upon standing had c/o dizziness and cold sweat, but  unable to get reading; pt very pale  once sitting down,  115/74.   Pain Pain Assessment Pain Score:   2 Pain Location: Back Pain Intervention(s): RN made aware (discussed pain med schedule in PM to allow sleep)   Therapy/Group: Individual Therapy  Delores Thelen 04/29/2011, 12:26 PM

## 2011-04-29 NOTE — Progress Notes (Signed)
Patient ID: Norman Pearson, male   DOB: 01-12-1951, 60 y.o.   MRN: [id number] Yesterday afternoon spoke w/ pt about Team Conference report. ELOS: 05/07/11  Goals: Modified Independent-Supervision/occ Min assist  Pt agreeable to this plan.

## 2011-04-29 NOTE — Progress Notes (Signed)
Physical Therapy Note  Patient Details  Name: Norman Pearson MRN: [id number] Date of Birth: [birth year] Today's Date: 04/29/2011  Time: 1430-1505 35 minutes  No c/o pain.  Session focused on household gait training with sidestepping, turns and negotiating in tight spaces.  Pt required only steadying assist with all gait.  Pt stated he felt "a little" dizzy during gait but not so much that he felt he could not perform tasks.  Pt required cues for gait technique with RW.  Squat pivot transfers bed to/from w/c with cues for w/c set up and cues for safe technique.  Supervision assist for squat pivot.   Individual therapy  Allaina Brotzman 04/29/2011, 3:10 PM

## 2011-04-29 NOTE — Progress Notes (Signed)
Occupational Therapy Session Note  Patient Details  Name: Norman Pearson MRN: [id number] Date of Birth: 11/08/1950  Today's Date: 04/29/2011 Time: 1300-1400 Time Calculation (min): 60 min  Precautions: Precautions Precautions: Back Required Braces or Orthoses: Yes Spinal Brace: Thoracolumbosacral orthotic;Applied in sitting position Restrictions Weight Bearing Restrictions: Yes LLE Weight Bearing: Non weight bearing  Short Term Goals: OT Short Term Goal 1: Pt will toilet with mod independence. OT Short Term Goal 2: Pt will transfer to toilet with mod independence. OT Short Term Goal 3: Pt will dress don shorts or pants with mod independence. OT Short Term Goal 4: Pt will bathe with set up.  Skilled Therapeutic Interventions/Progress Updates:    Session focused on ther ex and UE strengthening to assist with sit to stand and transfers secondary to NWB in LLE.  Engaged in 3 reps of 10 with 3# weights to address bicep, triceps, and adductors/abductors.  PNF pattern with 4# weighted ball with limited ROM secondary to restrictions of no twisting 3 reps of 10.  And chair pushups 4 reps of 5.  Pt reports increased "swimmy headed" feeling with chair pushups, BP 117/77, rest break and water and pt reported feeling better.  Stand pivot transfer with close supervision.  Pain Pain Assessment Pain Assessment: 0-10 Pain Score:   4 Pain Type: Surgical pain Pain Location: Back Pain Orientation: Posterior Pain Descriptors: Aching Pain Onset: Gradual Pain Intervention(s): medicated prior to session  Therapy/Group: Individual Therapy  Leonette Monarch 04/29/2011, 3:10 PM

## 2011-04-29 NOTE — Progress Notes (Signed)
Patient ID: BORUCH MANUELE, male   DOB: 1950-08-10, 60 y.o.   MRN: [id number] Patient ID: DONZELL COLLER, male   DOB: [birth year], 60 y.o.   MRN: [id number] Subjective/Complaints: ,Review of Systems  Musculoskeletal: Positive for back pain.  Neurological: Positive for tremors and sensory change.  Right thigh tender.    Objective: Vital Signs: Blood pressure 110/50, pulse 130, temperature 99.5 F (37.5 C), temperature source Oral, resp. rate 20, height 6' (1.829 m), weight 76.7 kg (169 lb 1.5 oz), SpO2 86.00%. No results found.  Basename 04/28/11 0632  WBC 8.3  HGB 10.0*  HCT 29.9*  PLT 465*    Basename 04/28/11 0632  NA 138  K 4.1  CL 103  CO2 27  GLUCOSE 104*  BUN 16  CREATININE 1.00  CALCIUM 8.2*   CBG (last 3)  No results found for this basename: GLUCAP:3 in the last 72 hours  Wt Readings from Last 3 Encounters:  04/27/11 76.7 kg (169 lb 1.5 oz)  04/17/11 73.483 kg (162 lb)  04/17/11 73.483 kg (162 lb)    Physical Exam:  General appearance: alert Head: Normocephalic, without obvious abnormality, atraumatic Eyes: conjunctivae/corneas clear. PERRL, EOM's intact. Fundi benign. Ears: normal TM's and external ear canals both ears Nose: Nares normal. Septum midline. Mucosa normal. No drainage or sinus tenderness. Throat: lips, mucosa, and tongue normal; teeth and gums normal Neck: no adenopathy, no carotid bruit, no JVD, supple, symmetrical, trachea midline and thyroid not enlarged, symmetric, no tenderness/mass/nodules Back: symmetric, no curvature. ROM normal. No CVA tenderness. Resp: clear to auscultation bilaterally Cardio: regular rate and rhythm, S1, S2 normal, no murmur, click, rub or gallop GI: soft, non-tender; bowel sounds normal; no masses,  no organomegaly Extremities: no edema, redness or tenderness in the calves or thighs and no ulcers, gangrene or trophic changes Pulses: 2+ and symmetric Skin: Skin color, texture, turgor normal. No rashes or  lesions Neurologic: Grossly normal except for right leg with pain inhibition weakness as well as sensory loss and dysesthesias along antero-medial thigh. Incision/Wound: back wound clean and intact.  Left leg in splint and intact.  Left hand wound clean.   Assessment/Plan: 1. Functional deficits secondary to left distal tibial/proximal fibular fxs, L1 burst fx s/p decompression and fusion which require 3+ hours per day of interdisciplinary therapy in a comprehensive inpatient rehab setting. Physiatrist is providing close team supervision and 24 hour management of active medical problems listed below. Physiatrist and rehab team continue to assess barriers to discharge/monitor patient progress toward functional and medical goals. Mobility: Bed Mobility Rolling Right: 4: Min assist Rolling Left: 6: Modified independent (Device/Increase time);With rail Right Sidelying to Sit: 4: Min assist Sitting - Scoot to Edge of Bed: 5: Supervision Sit to Supine - Right: 2: Max assist Sit to Supine - Right Details (indicate cue type and reason): lift B LEs into bed Transfers Transfers: Yes Sit to Stand: 4: Min assist Sit to Stand Details (indicate cue type and reason): c/o dizziness, min assist to steady balance Stand to Sit: 4: Min assist Stand Pivot Transfers: 3: Mod assist Stand Pivot Transfer Details (indicate cue type and reason): lifting assist to stand, steadying assist for turning with RW Squat Pivot Transfers: 5: Supervision Squat Pivot Transfer Details (indicate cue type and reason): cues for technique Ambulation/Gait Ambulation/Gait Assistance: 1: +2 Total assist (second person followed with chair due to BP) Ambulation/Gait Assistance Details (indicate cue type and reason): unable to assess due to orthostatic hypotension Ambulation Distance (Feet): 40 Feet Assistive  device: Scientist, research (life sciences): Yes Wheelchair Assistance: 4: Min Contractor: Both upper extremities Wheelchair Parts Management: Needs assistance Distance: 150 feet ADL:   Cognition: Cognition Overall Cognitive Status: Appears within functional limits for tasks assessed Orientation Level: Oriented X4 Cognition Orientation Level: Oriented X4   Medical Problem List and Plan:  1. DVT Prophylaxis/Anticoagulation: Mechanical: Antiembolism stockings, thigh (TED hose) Bilateral lower extremities  Sequential compression devices, below knee Bilateral lower extremities. lovenox 2. Pain Management:I suspsect some of right thigh pain is neuropathic.  Didn't like oxy cr.  Wants oxy ir in 3 hour intervals.  Will try for now. And neurontin 100 tid. Consider fentanyl patch but hold for now 3. Mood: Motivated to get better. Concerned about urinary retention. Educated patient about neurogenic B &B as well as side effects of meds.     4. Unstable burst fracture of first lumbar vertebra (04/14/2011)  Assessment: Decompressive lam with arthrodesis for stabilization on 11/20 Plan: TLSO if HOB greater than 30 degrees. Routine back precautions. CIR level therapies as above. 5. Closed fracture of left fibula (04/14/2011)  Assessment: ORIF for repair tibial pilon fx 11/29. Plan: Continue NWB LLE. CIR level therapies as above. 6. Laceration of fourth finger of left hand (04/14/2011)  Assessment: repaired and healing nicely.. 7. Anemia associated with acute blood loss (04/15/2011)  Assessment: Continue to monitor hgb for now. Add MVI for supplement due to constipation issues Plan: Will check serial H&H. As constipation resolves may add iron supplement. 8. Hypocalcemia (04/15/2011)  Plan: Level improved today. Will as supplement as indicated. 9. Recurrent urinary retention (04/27/2011)  Assessment: Due to neurogenic bladder with recurrent surgery, narcotic, immobility and CONSTIPATION. Continue foley until am tomorrow and then d/c. Plan: .Marland Kitchen Will hold off on urecholine due to  orthostatic symptoms. Flomax inititated and no orthostais today. 10. Constipation: Add miralax to dulcolax tabs. SSE Monitor narcotics intake.  Push fluids. 11. Dizziness: due to orthostasis vs med effects. Improved over the last 24 hours.    LOS (Days) 2  SWARTZ,ZACHARY T 04/29/2011, 8:57 AM     A FACE TO FACE EXAM WAS PERFORMED

## 2011-04-29 NOTE — Progress Notes (Signed)
Occupational Therapy Session Note  Patient Details  Name: Norman Pearson MRN: [id number] Date of Birth: 1951/05/02  Today's Date: 04/29/2011 Time: 0825-0915 Time Calculation (min): 50 min  Precautions: Precautions Precautions: Back Required Braces or Orthoses: Yes Spinal Brace: Thoracolumbosacral orthotic;Applied in supine position Restrictions Weight Bearing Restrictions: Yes LLE Weight Bearing: Non weight bearing  Short Term Goals: OT Short Term Goal 1: Pt will toilet with mod independence. OT Short Term Goal 2: Pt will transfer to toilet with mod independence. OT Short Term Goal 3: Pt will dress don shorts or pants with mod independence. OT Short Term Goal 4: Pt will bathe with set up.  Skilled Therapeutic Interventions/Progress Updates:    Pt seen for ADL retraining with focus on pt recalling all steps to don TLSO and giving instructions to ensure his retaining of donning procedure.  Pt demonstrated improved ability to roll side to side with only supervision cues to "log roll" with straight spine.  Educated pt on use of reacher to assist with donning shorts -- pt required assistance to set up reacher and thread LLE secondary to wrapping.  Stand pivot transfer to w/c with min assist/steady assist using RW.  Grooming conducted at sink from w/c with assist to obtain items.  Ther ex with UE arm bike to increase endurance and activity tolerance.  Pt reported slight dizziness with arm bike, but no reports of dizziness throughout rest of session.  General General Amount of Missed OT Time (min): 10 Minutes Pain Pain Assessment Pain Assessment: 0-10 Pain Score:   2 Pain Type: Surgical pain Pain Location: Back Pain Orientation: Posterior Pain Descriptors: Aching Pain Onset: On-going Pain Intervention(s): premedicated early this AM ADL See FIM for ADL information  Therapy/Group: Individual Therapy  Leonette Monarch 04/29/2011, 10:23 AM

## 2011-04-30 MED ORDER — SULFAMETHOXAZOLE-TMP DS 800-160 MG PO TABS
1.0000 | ORAL_TABLET | Freq: Two times a day (BID) | ORAL | Status: DC
  Filled 2011-04-30 (×3): qty 1

## 2011-04-30 MED ORDER — GABAPENTIN 300 MG PO CAPS
300.0000 mg | ORAL_CAPSULE | Freq: Three times a day (TID) | ORAL | Status: DC
  Administered 2011-04-30 – 2011-05-07 (×20): 300 mg via ORAL
  Filled 2011-04-30 (×24): qty 1

## 2011-04-30 NOTE — Progress Notes (Signed)
Physical Therapy Session Note  Patient Details  Name: Norman Pearson MRN: [id number] Date of Birth: [birth year]  Today's Date: 04/30/2011 Time: 1000-1100 Time Calculation (min): 60 min  Precautions: Precautions Precautions: Back Required Braces or Orthoses: Yes Spinal Brace: Thoracolumbosacral orthotic;Applied in sitting position Restrictions Weight Bearing Restrictions: Yes LLE Weight Bearing: Non weight bearing  Skilled Therapeutic Interventions/Progress Updates:    Gait training with RW x 55' x 2 with min@.  Trial of using knee walker x 12', pt felt like it put a strain on his back due to position of leaning forward.  Ascend and descend 3, 4" steps with 2 rails with min@, after instruction on how to perform with NWB LLE.  W/c propulsion on unit 150' x 2 with supervision.  Pt reported that RLE felt significantly weak at end of session.  Pain Pain Assessment Pain Assessment: 0-10 Pain Score:   4 Pain Type: Acute pain Pain Location: Back Pain Orientation: Left Pain Descriptors: Aching     Therapy/Group: Individual Therapy   Georges Mouse 04/30/2011, 11:12 AM

## 2011-04-30 NOTE — Progress Notes (Signed)
Physical Therapy Session Note  Patient Details  Name: Norman Pearson MRN: [id number] Date of Birth: [birth year]  Today's Date: 04/30/2011 Time: 4098-1191 Time Calculation (min): 68 min  Precautions: Precautions Precautions: Back Required Braces or Orthoses: Yes Spinal Brace: Thoracolumbosacral orthotic;Applied in sitting position Restrictions Weight Bearing Restrictions: Yes LLE Weight Bearing: Non weight bearing  Skilled Therapeutic Interventions/Progress Updates:   Car transfer training using RW with min@, problem solving through differences of pt's car vs. The simulated car.  Nustep on work load = 3, x 6 min x 2, for RLE and UE strengthening. Home environment transfer training to regular bed-w/c, encouraging patient to problem solve through how he might perform transfer at home if he did not have any assistance and was not using the RW, just the w/c.  Pt able to perform with close supervision after a few instructional cues.  Pt returned to room at end of session due to fatigue with patient initiating cover over of transfer training to getting into bed, able to perform with min verbal cues.   Pain Pain Assessment Pain Assessment: 0-10 Pain Score:   4 Pain Type: Chronic pain Pain Location: Back Pain Orientation: Lower Pain Descriptors: Aching In pm reported that pain level was similar to am session, as above.  Therapy/Group: Individual Therapy  Georges Mouse 04/30/2011, 2:25 PM

## 2011-04-30 NOTE — Progress Notes (Signed)
Occupational Therapy Session Note  Patient Details  Name: DEQUARIUS JEFFRIES MRN: [id number] Date of Birth: 22-Jun-1950  Today's Date: 04/30/2011 Time: 0815-0915 Time Calculation (min): 60 min  Precautions: Precautions Precautions: Back Required Braces or Orthoses: Yes Spinal Brace: Thoracolumbosacral orthotic;Applied in sitting position Restrictions Weight Bearing Restrictions: Yes LLE Weight Bearing: Non weight bearing   Skilled Therapeutic Interventions/Progress Updates:    ADL retraining including bathing upper/lower body at sink and dressing UB in bed and LB while sitting at sink in w/c.   Pt min assist for stand pivot toilieting transfer but close supervision for standing with grab bars to perform personal hygiene.   Pt supervision for UB bathing at sink level in w/c with TLSO on.   Pt supervision for LB bathing at sink with AE and steady assist while standing to clean bottom.   Pt supervision for UB dressing in bed secondary to back precaution.  Pt able to roll side to side and walk therapist through putting TLSO on.  Pt supervision from supine to sitting and sitting EOB to supine with extra time for LLE.  Pt min assist for stand pivot transfer with r/w from bed-w/c.  Pt min assist for LB dressing needed assist with shoe due to back precaution.   Pt c/o 3/10 pain in back and dizziness with standing and sitting EOB.  Focus on bed mobility, transfers, safety awareness with back precautions, sit to stand, and activity tolerance/endurance to increase independence with ADLS.       Pain Pain Assessment: 0-10 Pain Score:   3 Pain Type: Acute pain Pain Location: Back    Therapy/Group: Individual Therapy  Janett Billow 04/30/2011, 10:53 AM

## 2011-04-30 NOTE — Progress Notes (Signed)
Occupational Therapy Session Note  Patient Details  Name: Norman Pearson MRN: [id number] Date of Birth: Aug 03, 1950  Today's Date: 04/30/2011 Time: 1510-1540 Time Calculation (min): 30 min  Precautions: Precautions Precautions: Back Required Braces or Orthoses: Yes Spinal Brace: Thoracolumbosacral orthotic;Applied in sitting position Restrictions Weight Bearing Restrictions: Yes LLE Weight Bearing: Non weight bearing  Short Term Goals: OT Short Term Goal 1: Pt will toilet with mod independence. OT Short Term Goal 2: Pt will transfer to toilet with mod independence. OT Short Term Goal 3: Pt will dress don shorts or pants with mod independence. OT Short Term Goal 4: Pt will bathe with set up.  Pain Pain Assessment Pain Score:   4  Exercises Focus on UE strengthening to assist with transfers and sit to stand.  Donned TLSO in supine with pt rolling side to side with mod I -- use of hand rails.  Pt sat on EOB with supervision and completed theraband exercises 3 reps of 10 to increase biceps, triceps, rhomboids, and external rotation to assist with bed mobility and transfers.  Pt reports mild discomfort throughout lower back with exercises.  Therapy/Group: Individual Therapy  Leonette Monarch 04/30/2011, 4:36 PM

## 2011-04-30 NOTE — Progress Notes (Signed)
Recreational Therapy Assessment and Plan  Patient Details  Name: Norman Pearson MRN: [id number] Date of Birth: 1951/02/27  Rehab Potential:  Good  ELOS:     Assessment Clinical Impression:Patient is a 60 y.o. year old male with recent admission to the hospital on 04/14/11 after fall on ladder with complaints of left ankle and low back pain. Patient transferred to CIR on 04/27/2011 . X rays with highly comminuted left distal tibial metaphysis fracture with intra-articular extension, mildly displaced promixal fibular fracture, central HNP C3-C4 and L1 burst fracture with 50% retropulsion into the canal. Patient taken to OR for I and D left hand laceration, closed reduction with placement of external fixator on LLE by Dr. Victorino Dike. Evaluated by Dr. Danielle Dess and patient underwent posterior decompression of L1 burst fracture with posterior fixation and arthrodesis T12-L2 on the same day. Post op NWB LLE and TLSO when HOB > 30 degrees. Patient underwent ORIF left tibal fracture with removal of external fixator on 11/29 by Dr. Victorino Dike. To continue NWB LLE. Post op with recurrent urinary retention with bladder spasms and foley replaced. Has had issues with constipation since surgery. Foley removed today with inability to void despite bladder volume @1100cc , therefore foley replaced. Therapies ongoing and patient with complaints of dizziness with mobility. No significant PMH related to this admission.   Pt presents with decreased functional mobility, decreased balance, decreased activity tolerance, increased pain limiting pt's independence with leisure/community pursuits.   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) Spiritual Interests: Church Other Leisure Interests:  Television;Reading;Computer Leisure Participation Style: With Family/Friends;Alone Awareness of Community Resources: Multimedia programmer Appropriate for Education?: Yes Patient agreeable to Pet Therapy: Yes Does patient have pets?: Yes (dog) Social interaction - Mood/Behavior: Cooperative Recreational Therapy Orientation Orientation -Reviewed with patient: Available activity resources Strengths/Weaknesses Patient Strengths/Abilities: Active premorbidly Patient weaknesses: Physical limitations  Plan  Min 2 time for >20 minutes  Discharge Criteria: Patient will be discharged from TR if patient refuses treatment 3 consecutive times without medical reason.  If treatment goals not met, if there is a change in medical status, if patient makes no progress towards goals or if patient is discharged from hospital.  The above assessment, treatment plan, treatment alternatives and goals were discussed and mutually agreed upon: by patient  [name] 04/30/2011, 4:56 PM

## 2011-04-30 NOTE — Progress Notes (Signed)
Patient ID: Norman Pearson, male   DOB: [birth year], 60 y.o.   MRN: [id number] Patient ID: Norman Pearson, male   DOB: 02-Mar-1951, 60 y.o.   MRN: [id number] Patient ID: Norman Pearson, male   DOB: [birth year], 60 y.o.   MRN: [id number] Subjective/Complaints: ,Review of Systems  Musculoskeletal: Positive for back pain.  Neurological: Positive for tremors and sensory change.  Right thigh tender.   Pain remains an issue  Objective: Vital Signs: Blood pressure 95/58, pulse 122, temperature 98.6 F (37 C), temperature source Oral, resp. rate 20, height 6' (1.829 m), weight 77.111 kg (170 lb), SpO2 97.00%. No results found.  Basename 04/28/11 0632  WBC 8.3  HGB 10.0*  HCT 29.9*  PLT 465*    Basename 04/28/11 0632  NA 138  K 4.1  CL 103  CO2 27  GLUCOSE 104*  BUN 16  CREATININE 1.00  CALCIUM 8.2*   CBG (last 3)  No results found for this basename: GLUCAP:3 in the last 72 hours  Wt Readings from Last 3 Encounters:  04/29/11 77.111 kg (170 lb)  04/17/11 73.483 kg (162 lb)  04/17/11 73.483 kg (162 lb)    Physical Exam:  General appearance: alert Head: Normocephalic, without obvious abnormality, atraumatic Eyes: conjunctivae/corneas clear. PERRL, EOM's intact. Fundi benign. Ears: normal TM's and external ear canals both ears Nose: Nares normal. Septum midline. Mucosa normal. No drainage or sinus tenderness. Throat: lips, mucosa, and tongue normal; teeth and gums normal Neck: no adenopathy, no carotid bruit, no JVD, supple, symmetrical, trachea midline and thyroid not enlarged, symmetric, no tenderness/mass/nodules Back: symmetric, no curvature. ROM normal. No CVA tenderness. Resp: clear to auscultation bilaterally Cardio: regular rate and rhythm, S1, S2 normal, no murmur, click, rub or gallop GI: soft, non-tender; bowel sounds normal; no masses,  no organomegaly Extremities: no edema, redness or tenderness in the calves or thighs and no ulcers, gangrene or trophic  changes Pulses: 2+ and symmetric Skin: Skin color, texture, turgor normal. No rashes or lesions Neurologic: Grossly normal except for right leg with pain inhibition weakness as well as sensory loss and dysesthesias along antero-medial thigh. Incision/Wound: back wound clean and intact.  Left leg in splint and intact.  Left hand wound clean.   Assessment/Plan: 1. Functional deficits secondary to left distal tibial/proximal fibular fxs, L1 burst fx s/p decompression and fusion which require 3+ hours per day of interdisciplinary therapy in a comprehensive inpatient rehab setting. Physiatrist is providing close team supervision and 24 hour management of active medical problems listed below. Physiatrist and rehab team continue to assess barriers to discharge/monitor patient progress toward functional and medical goals. Mobility: Bed Mobility Rolling Right: 4: Min assist Rolling Left: 6: Modified independent (Device/Increase time);With rail Right Sidelying to Sit: 4: Min assist Sitting - Scoot to Edge of Bed: 5: Supervision Sit to Supine - Right: 2: Max assist Sit to Supine - Right Details (indicate cue type and reason): lift B LEs into bed Transfers Transfers: Yes Sit to Stand: 4: Min assist Sit to Stand Details (indicate cue type and reason): c/o dizziness, min assist to steady balance Stand to Sit: 4: Min assist Stand Pivot Transfers: 3: Mod assist Stand Pivot Transfer Details (indicate cue type and reason): lifting assist to stand, steadying assist for turning with RW Squat Pivot Transfers: 5: Supervision Squat Pivot Transfer Details (indicate cue type and reason): cues for technique Ambulation/Gait Ambulation/Gait Assistance: 1: +2 Total assist (second person followed with chair due to BP) Ambulation/Gait Assistance Details (indicate cue  type and reason): unable to assess due to orthostatic hypotension Ambulation Distance (Feet): 40 Feet Assistive device: Rolling walker Wheelchair  Mobility Wheelchair Mobility: Yes Wheelchair Assistance: 4: Min Education officer, museum: Both upper extremities Wheelchair Parts Management: Needs assistance Distance: 150 feet ADL:   Cognition: Cognition Overall Cognitive Status: Appears within functional limits for tasks assessed Orientation Level: Oriented X4 Cognition Orientation Level: Oriented X4   Medical Problem List and Plan:  1. DVT Prophylaxis/Anticoagulation: Mechanical: Antiembolism stockings, thigh (TED hose) Bilateral lower extremities  Sequential compression devices, below knee Bilateral lower extremities. lovenox  2. Pain Management:I suspsect some of right thigh pain is neuropathic.  Didn't like oxy cr.  Wants oxy ir in 3 hour intervals.  Will try for now. Increase neurontin to 300mg  TID. Pain control improving overall.  3. Mood: Motivated to get better. Concerned about urinary retention. Educated patient about neurogenic B &B as well as side effects of meds.     4. Unstable burst fracture of first lumbar vertebra (04/14/2011)  Assessment: Decompressive lam with arthrodesis for stabilization on 11/20 Plan: TLSO if HOB greater than 30 degrees. Routine back precautions. CIR level therapies as above.  5. Closed fracture of left fibula (04/14/2011)  Assessment: ORIF for repair tibial pilon fx 11/29. Plan: Continue NWB LLE. CIR level therapies as above.  6. Laceration of fourth finger of left hand (04/14/2011)  Assessment: repaired and healing nicely.  7. Anemia associated with acute blood loss (04/15/2011)  Assessment: Continue to monitor hgb for now. Add MVI for supplement due to constipation issues Plan: Will check serial H&H. As constipation resolves may add iron supplement.  8. Hypocalcemia (04/15/2011)  Plan: Level improved today. Will as supplement as indicated.  9. Recurrent urinary retention (04/27/2011)  Assessment: Due to neurogenic bladder with recurrent surgery, narcotic, immobility and  CONSTIPATION.   -Enterococcus UTI- sensitive to cipro- start today.  -Voiding trial today.  Hold on urecholine at present.  10. Constipation: Add miralax to dulcolax tabs. SSE Monitor narcotics intake.  Push fluids.  11. Dizziness: due to orthostasis vs med effects. Improved over the last 24 hours.    LOS (Days) 3  Karstyn Birkey T 04/30/2011, 8:15 AM     A FACE TO FACE EXAM WAS PERFORMED

## 2011-05-01 MED ORDER — NORTRIPTYLINE HCL 10 MG PO CAPS
10.0000 mg | ORAL_CAPSULE | Freq: Every day | ORAL | Status: DC
  Administered 2011-05-01 – 2011-05-06 (×6): 10 mg via ORAL
  Filled 2011-05-01 (×7): qty 1

## 2011-05-01 MED ORDER — POLYETHYLENE GLYCOL 3350 17 G PO PACK
17.0000 g | PACK | Freq: Every day | ORAL | Status: DC
  Administered 2011-05-01 – 2011-05-05 (×5): 17 g via ORAL
  Filled 2011-05-01 (×8): qty 1

## 2011-05-01 NOTE — Progress Notes (Signed)
Physical Therapy Note  Patient Details  Name: COLDEN SAMARAS MRN: [id number] Date of Birth: 1950-11-14 Today's Date: 05/01/2011  Individual therapy [phone] C/o some RLE pain end of session, notified RN. Treatment session focused on gait training with RW, good stride length and maintaining LLE NWB with close S/steadyA 39' x2. Dynamic standing balance activity to shoot basketball in standing with RW for 1 UE support. After about 5 minutes, pt began to feel a little bit dizzy BP = 98/69 mmHg, HR = 116 bpm and symptoms resolved after a few minutes of seated rest break. W/c propulsion on unit > 150' for general mobility and UE strengthening. Close S stand pivot transfer from w/c with grab bars to toilet and back at end of session. Overall tolerated session well.   Karolee Stamps [facility] 05/01/2011, 10:10 AM

## 2011-05-01 NOTE — Progress Notes (Signed)
Patient ID: Norman Pearson, male   DOB: 09-Oct-1950, 60 y.o.   MRN: [id number] Patient ID: Norman Pearson, male   DOB: [birth year], 60 y.o.   MRN: [id number] Patient ID: Norman Pearson, male   DOB: 1951-03-29, 60 y.o.   MRN: [id number] Patient ID: Norman Pearson, male   DOB: [birth year], 60 y.o.   MRN: [id number] Subjective/Complaints: ,Review of Systems  Musculoskeletal: Positive for back pain.  Neurological: Positive for tremors and sensory change.  Right thigh still burning at night but better.  Objective: Vital Signs: Blood pressure 87/58, pulse 129, temperature 98.6 F (37 C), temperature source Oral, resp. rate 20, height 6' (1.829 m), weight 77.111 kg (170 lb), SpO2 96.00%. No results found. No results found for this basename: WBC:2,HGB:2,HCT:2,PLT:2 in the last 72 hours No results found for this basename: NA:2,K:2,CL:2,CO2:2,GLUCOSE:2,BUN:2,CREATININE:2,CALCIUM:2 in the last 72 hours CBG (last 3)  No results found for this basename: GLUCAP:3 in the last 72 hours  Wt Readings from Last 3 Encounters:  04/29/11 77.111 kg (170 lb)  04/17/11 73.483 kg (162 lb)  04/17/11 73.483 kg (162 lb)    Physical Exam:  General appearance: alert Head: Normocephalic, without obvious abnormality, atraumatic Eyes: conjunctivae/corneas clear. PERRL, EOM's intact. Fundi benign. Ears: normal TM's and external ear canals both ears Nose: Nares normal. Septum midline. Mucosa normal. No drainage or sinus tenderness. Throat: lips, mucosa, and tongue normal; teeth and gums normal Neck: no adenopathy, no carotid bruit, no JVD, supple, symmetrical, trachea midline and thyroid not enlarged, symmetric, no tenderness/mass/nodules Back: symmetric, no curvature. ROM normal. No CVA tenderness. Resp: clear to auscultation bilaterally Cardio: regular rate and rhythm, S1, S2 normal, no murmur, click, rub or gallop GI: soft, non-tender; bowel sounds normal; no masses,  no organomegaly Extremities: no edema, redness  or tenderness in the calves or thighs and no ulcers, gangrene or trophic changes Pulses: 2+ and symmetric Skin: Skin color, texture, turgor normal. No rashes or lesions Neurologic: Grossly normal except for right leg with pain inhibition weakness as well as sensory loss and dysesthesias along antero-medial thigh. Strength in rle grossly 4/5. Left foot nvi. Incision/Wound: back wound clean and intact.  Left leg in splint and intact.  Left hand wound clean.   Assessment/Plan: 1. Functional deficits secondary to left distal tibial/proximal fibular fxs, L1 burst fx s/p decompression and fusion which require 3+ hours per day of interdisciplinary therapy in a comprehensive inpatient rehab setting. Physiatrist is providing close team supervision and 24 hour management of active medical problems listed below. Physiatrist and rehab team continue to assess barriers to discharge/monitor patient progress toward functional and medical goals. Mobility: Bed Mobility Rolling Right: 4: Min assist Rolling Left: 6: Modified independent (Device/Increase time);With rail Right Sidelying to Sit: 4: Min assist Sitting - Scoot to Edge of Bed: 5: Supervision Sit to Supine - Right: 2: Max assist Sit to Supine - Right Details (indicate cue type and reason): lift B LEs into bed Transfers Transfers: Yes Sit to Stand: 4: Min assist Sit to Stand Details (indicate cue type and reason): c/o dizziness, min assist to steady balance Stand to Sit: 4: Min assist Stand Pivot Transfers: 3: Mod assist Stand Pivot Transfer Details (indicate cue type and reason): lifting assist to stand, steadying assist for turning with RW Squat Pivot Transfers: 5: Supervision Squat Pivot Transfer Details (indicate cue type and reason): cues for technique Ambulation/Gait Ambulation/Gait Assistance: 4: Min assist Ambulation/Gait Assistance Details (indicate cue type and reason): unable to assess due to orthostatic hypotension  Ambulation Distance  (Feet): 40 Feet Assistive device: Rolling walker Wheelchair Mobility Wheelchair Mobility: Yes Wheelchair Assistance: 4: Min Education officer, museum: Both upper extremities Wheelchair Parts Management: Needs assistance Distance: 150 feet ADL:   Cognition: Cognition Overall Cognitive Status: Appears within functional limits for tasks assessed Orientation Level: Oriented X4 Cognition Orientation Level: Oriented X4   Medical Problem List and Plan:  1. DVT Prophylaxis/Anticoagulation: Mechanical: Antiembolism stockings, thigh (TED hose) Bilateral lower extremities  Sequential compression devices, below knee Bilateral lower extremities. lovenox  2. Pain Management:I suspsect some of right thigh pain is neuropathic.  Didn't like oxy cr.  Wants oxy ir in 3 hour intervals.  Will try for now. Increase neurontin to 300mg  TID. Pain control improving overall. Will add pamelor qhs for radicular pain.  3. Mood: Motivated to get better. Concerned about urinary retention. Educated patient about neurogenic B &B as well as side effects of meds.     4. Unstable burst fracture of first lumbar vertebra (04/14/2011)  Assessment: Decompressive lam with arthrodesis for stabilization on 11/20 Plan: TLSO if HOB greater than 30 degrees. Routine back precautions. CIR level therapies as above.  5. Closed fracture of left fibula (04/14/2011)  Assessment: ORIF for repair tibial pilon fx 11/29. Plan: Continue NWB LLE. Check with ortho before d/c re: potential casting  6. Laceration of fourth finger of left hand (04/14/2011)  Assessment: repaired and healing nicely.  7. Anemia associated with acute blood loss (04/15/2011)  Assessment: Continue to monitor hgb for now. Add MVI for supplement due to constipation issues Plan: Will check serial H&H. As constipation resolves may add iron supplement.  8. Hypocalcemia (04/15/2011)  Plan: Level improved today. Will as supplement as indicated.  9. Recurrent  urinary retention (04/27/2011)  Assessment: Due to neurogenic bladder with recurrent surgery, narcotic, immobility and CONSTIPATION.   -Enterococcus UTI- sensitive to cipro  -Voiding trial today.  Hold on urecholine at present.  10. Constipation: Scheduled miralax.  Bowels moving now  11. Dizziness: improved.  Pushing fluids.    LOS (Days) 4  Kailand Seda T 05/01/2011, 7:02 AM     A FACE TO FACE EXAM WAS PERFORMED

## 2011-05-01 NOTE — Progress Notes (Signed)
Occupational Therapy Session Note  Patient Details  Name: Norman Pearson MRN: [id number] Date of Birth: [birth year]  Today's Date: 05/01/2011 Time: 1030-1100 Time Calculation (min): 30 min  Precautions: Precautions Precautions: Back Required Braces or Orthoses: Yes Spinal Brace: Thoracolumbosacral orthotic;Applied in sitting position Restrictions Weight Bearing Restrictions: Yes LLE Weight Bearing: Non weight bearing  Short Term Goals: OT Short Term Goal 1: Pt will toilet with mod independence. OT Short Term Goal 2: Pt will transfer to toilet with mod independence. OT Short Term Goal 3: Pt will dress don shorts or pants with mod independence. OT Short Term Goal 4: Pt will bathe with set up.  Skilled Therapeutic Interventions/Progress Updates:    Skilled treatment on IADL tasks in the kitchen with education and training on recommendations of completing kitchen tasks at w/c level initially at discharge to home. Also focused on dynamic standing balance/tolerance (patient with complaints of "light headedness" -sat back in w/c upon onset) and focused on simulated walk in shower transfer onto shower seat with back. Recommend patient use 3-in-1 for shower use; discussed with patient and patient agreeable. Also encouraged patient to use pursed lip breathing when out of breath or when light headed.    Pain No complaints of pain  Therapy/Group: Individual Therapy  Kamisha Ell 05/01/2011, 11:54 AM

## 2011-05-01 NOTE — Progress Notes (Signed)
Participating in therapy. Requesting Oxy IR 10mg  q 3hrs for pain rating of 3/10. Lumbar incision with dermabond, OTA, unremarkable. LLE leg cast with edematous toes. Good capillary refill. Continent of bowel and bladder. Last bowel movement 05/01/11 x 2 small, soft, per pt's report. Complainant with back precautions and TSLO brace.

## 2011-05-01 NOTE — Progress Notes (Signed)
Physical Therapy Note  Patient Details  Name: Norman Pearson MRN: [id number] Date of Birth: Jan 30, 1951 Today's Date: 05/01/2011  Individual therapy. 1400-1500 W/c mobility on unit for general mobility training on unit > 150'. Dynamic standing balance and standing tolerance activity for putting together PVC pipe. Steady A intermittently with LOB when no UE support. RLE beginning to get fatigued by end of session. Practiced squat pivot transfers with removal of armrest as alternate transfer method if RW not nearby or fairly level surface. PT adjusted w/c leg rest for comfort and to decrease pressure on LLE. Bed mobility overall S, good following of back precautions. Notified RN for medicine for headache.   Karolee Stamps [facility] 05/01/2011, 3:16 PM

## 2011-05-01 NOTE — Progress Notes (Signed)
Occupational Therapy Session Note  Patient Details  Name: QUALYN OYERVIDES MRN: [id number] Date of Birth: 1950/10/12  Today's Date: 05/01/2011 Time: 0840-0920 Time Calculation (min): 40 min  Precautions: Precautions Precautions: Back Required Braces or Orthoses: Yes Spinal Brace: Thoracolumbosacral orthotic;Applied in sitting position Restrictions Weight Bearing Restrictions: Yes LLE Weight Bearing: Non weight bearing  Short Term Goals: OT Short Term Goal 1: Pt will toilet with mod independence. OT Short Term Goal 2: Pt will transfer to toilet with mod independence. OT Short Term Goal 3: Pt will dress don shorts or pants with mod independence. OT Short Term Goal 4: Pt will bathe with set up.  Skilled Therapeutic Interventions/Progress Updates:    Patient seen for OT sponge bathing and dressing at bed level to address knowledge of back precautions, dynamic stand balance, functional transfers during ADL..   Pain Pain Assessment Pain Score:   4  Therapy/Group: Individual Therapy  Collier Salina 05/01/2011, 9:22 AM

## 2011-05-02 MED ORDER — OXYCODONE HCL 5 MG PO TABS
5.0000 mg | ORAL_TABLET | ORAL | Status: DC | PRN
  Administered 2011-05-02 – 2011-05-04 (×9): 15 mg via ORAL
  Administered 2011-05-05 – 2011-05-06 (×3): 10 mg via ORAL
  Administered 2011-05-06: 15 mg via ORAL
  Administered 2011-05-06: 10 mg via ORAL
  Filled 2011-05-02: qty 2
  Filled 2011-05-02 (×5): qty 3
  Filled 2011-05-02: qty 2
  Filled 2011-05-02 (×10): qty 3

## 2011-05-02 MED ORDER — FENTANYL 12 MCG/HR TD PT72
12.5000 ug | MEDICATED_PATCH | TRANSDERMAL | Status: DC
  Administered 2011-05-02: 12.5 ug via TRANSDERMAL
  Filled 2011-05-02: qty 1

## 2011-05-02 NOTE — Progress Notes (Signed)
Patient ID: Norman Pearson, male   DOB: Sep 25, 1950, 60 y.o.   MRN: [id number] Patient ID: Norman Pearson, male   DOB: 06-May-1951, 60 y.o.   MRN: [id number] Patient ID: Norman Pearson, male   DOB: [birth year], 60 y.o.   MRN: [id number] Patient ID: Norman Pearson, male   DOB: 11/18/1950, 60 y.o.   MRN: [id number] Patient ID: Norman Pearson, male   DOB: Feb 07, 1951, 60 y.o.   MRN: [id number] Subjective/Complaints: ,Review of Systems  Musculoskeletal: Positive for back pain.  Neurological: Positive for tremors and sensory change.  Right thigh still burning at night but better. Taking oxy around the clock.  Objective: Vital Signs: Blood pressure 86/62, pulse 117, temperature 98.4 F (36.9 C), temperature source Oral, resp. rate 18, height 6' (1.829 m), weight 77.111 kg (170 lb), SpO2 95.00%. No results found. No results found for this basename: WBC:2,HGB:2,HCT:2,PLT:2 in the last 72 hours No results found for this basename: NA:2,K:2,CL:2,CO2:2,GLUCOSE:2,BUN:2,CREATININE:2,CALCIUM:2 in the last 72 hours CBG (last 3)  No results found for this basename: GLUCAP:3 in the last 72 hours  Wt Readings from Last 3 Encounters:  04/29/11 77.111 kg (170 lb)  04/17/11 73.483 kg (162 lb)  04/17/11 73.483 kg (162 lb)    Physical Exam:  General appearance: alert Head: Normocephalic, without obvious abnormality, atraumatic Eyes: conjunctivae/corneas clear. PERRL, EOM's intact. Fundi benign. Ears: normal TM's and external ear canals both ears Nose: Nares normal. Septum midline. Mucosa normal. No drainage or sinus tenderness. Throat: lips, mucosa, and tongue normal; teeth and gums normal Neck: no adenopathy, no carotid bruit, no JVD, supple, symmetrical, trachea midline and thyroid not enlarged, symmetric, no tenderness/mass/nodules Back: symmetric, no curvature. ROM normal. No CVA tenderness. Resp: clear to auscultation bilaterally Cardio: regular rate and rhythm, S1, S2 normal, no murmur, click, rub or  gallop GI: soft, non-tender; bowel sounds normal; no masses,  no organomegaly Extremities: no edema, redness or tenderness in the calves or thighs and no ulcers, gangrene or trophic changes Pulses: 2+ and symmetric Skin: Skin color, texture, turgor normal. No rashes or lesions Neurologic: Grossly normal except for right leg with pain inhibition weakness as well as sensory loss and dysesthesias along antero-medial thigh. Strength in rle grossly 4/5. Left foot nvi. Incision/Wound: back wound clean and intact.  Left leg in splint and intact.  Left hand wound clean.  TLSO upper right bolt loose   Assessment/Plan: 1. Functional deficits secondary to left distal tibial/proximal fibular fxs, L1 burst fx s/p decompression and fusion which require 3+ hours per day of interdisciplinary therapy in a comprehensive inpatient rehab setting. Physiatrist is providing close team supervision and 24 hour management of active medical problems listed below. Physiatrist and rehab team continue to assess barriers to discharge/monitor patient progress toward functional and medical goals. Mobility: Bed Mobility Rolling Right: 4: Min assist Rolling Left: 6: Modified independent (Device/Increase time);With rail Right Sidelying to Sit: 4: Min assist Sitting - Scoot to Edge of Bed: 5: Supervision Sit to Supine - Right: 2: Max assist Sit to Supine - Right Details (indicate cue type and reason): lift B LEs into bed Transfers Transfers: Yes Sit to Stand: 4: Min assist Sit to Stand Details (indicate cue type and reason): c/o dizziness, min assist to steady balance Stand to Sit: 4: Min assist Stand Pivot Transfers: 3: Mod assist Stand Pivot Transfer Details (indicate cue type and reason): lifting assist to stand, steadying assist for turning with RW Squat Pivot Transfers: 5: Supervision Squat Pivot Transfer Details (indicate cue  type and reason): cues for technique Ambulation/Gait Ambulation/Gait Assistance: 4: Min  assist Ambulation/Gait Assistance Details (indicate cue type and reason): unable to assess due to orthostatic hypotension Ambulation Distance (Feet): 40 Feet Assistive device: Rolling walker Wheelchair Mobility Wheelchair Mobility: Yes Wheelchair Assistance: 4: Min Education officer, museum: Both upper extremities Wheelchair Parts Management: Needs assistance Distance: 150 feet ADL:   Cognition: Cognition Overall Cognitive Status: Appears within functional limits for tasks assessed Orientation Level: Oriented X4 Cognition Orientation Level: Oriented X4   Medical Problem List and Plan:  1. DVT Prophylaxis/Anticoagulation: Mechanical: Antiembolism stockings, thigh (TED hose) Bilateral lower extremities  Sequential compression devices, below knee Bilateral lower extremities. lovenox  2. Pain Management:I suspsect some of right thigh pain is neuropathic.  Didn't like oxy cr.  Wants oxy ir in 3 hour intervals.  Will try for now. Increase neurontin to 300mg  TID. Pain control improving overall.  pamelor qhs for radicular pain also on board.  -add fentanyl to better cover base line pain.  3. Mood: Motivated to get better. Concerned about urinary retention.     4. Unstable burst fracture of first lumbar vertebra (04/14/2011)  Assessment: Decompressive lam with arthrodesis for stabilization on 11/20 Plan: TLSO if HOB greater than 30 degrees. Routine back precautions. CIR level therapies as above.  5. Closed fracture of left fibula (04/14/2011)  Assessment: ORIF for repair tibial pilon fx 11/29. Plan: Continue NWB LLE. Check with ortho before d/c re: potential casting  6. Laceration of fourth finger of left hand (04/14/2011)  Assessment: repaired and healing nicely.  7. Anemia associated with acute blood loss (04/15/2011)  Assessment: Continue to monitor hgb for now. Add MVI for supplement due to constipation issues Plan: Will check serial H&H. As constipation resolves may add  iron supplement.  8. Hypocalcemia (04/15/2011)  Plan: Level improved today. Will as supplement as indicated.  9. Recurrent urinary retention (04/27/2011)  Assessment: Due to neurogenic bladder with recurrent surgery, narcotic, immobility and CONSTIPATION.   -Enterococcus UTI- sensitive to cipro  -Voiding fairly well  10. Constipation: Scheduled miralax.  Bowels moving now  11. Dizziness: improved.  Pushing fluids.    LOS (Days) 5  SWARTZ,ZACHARY T 05/02/2011, 8:33 AM     A FACE TO FACE EXAM WAS PERFORMED

## 2011-05-02 NOTE — Progress Notes (Signed)
Patient alert and oriented x 3- Patient uses call bell appropriately. Patient continent of bowel and bladder with last bowel movement on 12/7. Patient able to stand pivot with walker supervision. Patient wears TLSO brace with HOB greater than 30 degrees. Patient lumbar incision approximated with no signs/symptoms of infection. Patient Lt foot in cast- +CMS, toes are swollen. Patient has fentanyl patch to right scapula. Patient pain at mod level and patient trying to do with out the oxycodone IR for longer periods. Patient appetite good. [employer] has adjusted the brace on the upper right side. Patient Tegaderm to left hand ring finger intact over suture incision. Continue with plan of care.

## 2011-05-02 NOTE — Progress Notes (Signed)
Occupational Therapy Session Note  Patient Details  Name: Norman Pearson MRN: [id number] Date of Birth: 1950/11/27  Today's Date: 05/02/2011 Time: 1300-1410 Time Calculation (min): 70 min  Precautions: Precautions Precautions: Back Required Braces or Orthoses: Yes Spinal Brace: Thoracolumbosacral orthotic;Applied in sitting position Restrictions Weight Bearing Restrictions: Yes LLE Weight Bearing: Non weight bearing  Short Term Goals: OT Short Term Goal 1: Pt will toilet with mod independence. OT Short Term Goal 2: Pt will transfer to toilet with mod independence. OT Short Term Goal 3: Pt will dress don shorts or pants with mod independence. OT Short Term Goal 4: Pt will bathe with set up.  Skilled Therapeutic Interventions/Progress Updates:    Engage in therapeutic exercises for BUE to promote stengthening in BADL tasks, wc mobility and functional mobility.  Pt did with moderated discomfort in lumbar region.  Instructed pt to perform isometric holds through  Abdominal region when exercising.  He stated that it helped.  Addressed back pain with stretches supine on mat with post. Pelvic tilt and hip flexion.  Did functional ambulation at end.       Vital Signs: O2=96%;  HR 100 pre therapy; HR=120 post therapy session  Pain 5/10;  Premedicated;  Did low back stretching with exercises    Exercises:  Gave handout for UE exercises      Therapy/Group: Individual Therapy  Humberto Seals 05/02/2011, 2:30 PM

## 2011-05-03 NOTE — Progress Notes (Signed)
Physical Therapy Note  Patient Details  Name: Norman Pearson MRN: [id number] Date of Birth: 1950/08/17 Today's Date: 05/03/2011  Individual therapy 1100-1155 (55 minutes) Denies pain at this time, "just a little sore and didn't have a good night sleeping." Gait training with RW with close S in controlled environment, minA for dynamic gait negotiating obstacles and stepping over objects. Right lower extremity buckled once during gait, minA to recover balance. Pt reports this is the second time this has occurred when he is fatigued. Discussed with pt importance of paying attention to his body and fatigue level when deciding on transfer and mobility techniques at home for safety. Dynamic standing balance activities to play horsehoes, reach outside BOS with RW for upper extremity support overall supervision with task. BP in sitting = 113/73 and standing = 91/58 with intermittent complaints of dizziness during standing activities.  Karolee Stamps [facility] 05/03/2011, 12:09 PM

## 2011-05-03 NOTE — Progress Notes (Signed)
Patient alert and oriented x 3- Patient uses call bell appropriately. Patient continent of bowel and bladder with last bowel movement on 12/9. Patient able to stand pivot with walker supervision. Patient wears TLSO brace with HOB greater than 30 degrees. Patient lumbar incision approximated with no signs/symptoms of infection. Patient Lt foot in cast- +CMS, toes are swollen. Patient has fentanyl patch to right scapula. Patient pain at mod level and patient trying to do with out the oxycodone IR for longer periods.Patient has received no pain medications so far this shift. Patient Tegaderm to left hand ring finger changed-sutures intact. Continue with plan of care.

## 2011-05-03 NOTE — Progress Notes (Signed)
Patient ID: Norman Pearson, male   DOB: Mar 15, 1951, 60 y.o.   MRN: [id number] Patient ID: Norman Pearson, male   DOB: 10-22-1950, 60 y.o.   MRN: [id number] Patient ID: Norman Pearson, male   DOB: 1950/06/24, 60 y.o.   MRN: [id number] Patient ID: Norman Pearson, male   DOB: 11-Jan-1951, 60 y.o.   MRN: [id number] Patient ID: Norman Pearson, male   DOB: 05-02-1951, 60 y.o.   MRN: [id number] Patient ID: Norman Pearson, male   DOB: 1951-02-26, 60 y.o.   MRN: [id number] Subjective/Complaints: ,Review of Systems  Musculoskeletal: Positive for back pain.  Neurological: Positive for tremors and sensory change.   Right thigh still burning at night but better. Left leg really sore after working hard yesterday.  Had difficulty sleeping last night.  Objective: Vital Signs: Blood pressure 107/56, pulse 103, temperature 98.6 F (37 C), temperature source Oral, resp. rate 16, height 6' (1.829 m), weight 77.111 kg (170 lb), SpO2 96.00%. No results found. No results found for this basename: WBC:2,HGB:2,HCT:2,PLT:2 in the last 72 hours No results found for this basename: NA:2,K:2,CL:2,CO2:2,GLUCOSE:2,BUN:2,CREATININE:2,CALCIUM:2 in the last 72 hours CBG (last 3)  No results found for this basename: GLUCAP:3 in the last 72 hours  Wt Readings from Last 3 Encounters:  04/29/11 77.111 kg (170 lb)  04/17/11 73.483 kg (162 lb)  04/17/11 73.483 kg (162 lb)    Physical Exam:  General appearance: alert Head: Normocephalic, without obvious abnormality, atraumatic Eyes: conjunctivae/corneas clear. PERRL, EOM's intact. Fundi benign. Ears: normal TM's and external ear canals both ears Nose: Nares normal. Septum midline. Mucosa normal. No drainage or sinus tenderness. Throat: lips, mucosa, and tongue normal; teeth and gums normal Neck: no adenopathy, no carotid bruit, no JVD, supple, symmetrical, trachea midline and thyroid not enlarged, symmetric, no tenderness/mass/nodules Back: symmetric, no curvature. ROM  normal. No CVA tenderness. Resp: clear to auscultation bilaterally Cardio: regular rate and rhythm, S1, S2 normal, no murmur, click, rub or gallop GI: soft, non-tender; bowel sounds normal; no masses,  no organomegaly Extremities: no edema, redness or tenderness in the calves or thighs and no ulcers, gangrene or trophic changes Pulses: 2+ and symmetric Skin: Skin color, texture, turgor normal. No rashes or lesions Neurologic: Grossly normal except for right leg with pain inhibition weakness as well as sensory loss and dysesthesias along antero-medial thigh. Strength in rle grossly 4/5. Some hip flexor weakness. Left foot nvi. Incision/Wound: back wound clean and intact.  Left leg in splint and intact.  Left hand wound clean.  TLSO upper right screw repaired   Assessment/Plan: 1. Functional deficits secondary to left distal tibial/proximal fibular fxs, L1 burst fx s/p decompression and fusion which require 3+ hours per day of interdisciplinary therapy in a comprehensive inpatient rehab setting. Physiatrist is providing close team supervision and 24 hour management of active medical problems listed below. Physiatrist and rehab team continue to assess barriers to discharge/monitor patient progress toward functional and medical goals. Mobility: Bed Mobility Rolling Right: 4: Min assist Rolling Left: 6: Modified independent (Device/Increase time);With rail Right Sidelying to Sit: 4: Min assist Sitting - Scoot to Edge of Bed: 5: Supervision Sit to Supine - Right: 2: Max assist Sit to Supine - Right Details (indicate cue type and reason): lift B LEs into bed Transfers Transfers: Yes Sit to Stand: 4: Min assist Sit to Stand Details (indicate cue type and reason): c/o dizziness, min assist to steady balance Stand to Sit: 4: Min assist Stand Pivot Transfers: 3: Mod assist  Stand Pivot Transfer Details (indicate cue type and reason): lifting assist to stand, steadying assist for turning with  RW Squat Pivot Transfers: 5: Supervision Squat Pivot Transfer Details (indicate cue type and reason): cues for technique Ambulation/Gait Ambulation/Gait Assistance: 4: Min assist Ambulation/Gait Assistance Details (indicate cue type and reason): unable to assess due to orthostatic hypotension Ambulation Distance (Feet): 40 Feet Assistive device: Rolling walker Wheelchair Mobility Wheelchair Mobility: Yes Wheelchair Assistance: 4: Min Education officer, museum: Both upper extremities Wheelchair Parts Management: Needs assistance Distance: 150 feet ADL:   Cognition: Cognition Overall Cognitive Status: Appears within functional limits for tasks assessed Orientation Level: Oriented X4 Cognition Orientation Level: Oriented X4   Medical Problem List and Plan:  1. DVT Prophylaxis/Anticoagulation: Mechanical: Antiembolism stockings, thigh (TED hose) Bilateral lower extremities  Sequential compression devices, below knee Bilateral lower extremities. lovenox  2. Pain Management:I suspsect some of right thigh pain is neuropathic.  Didn't like oxy cr.  Wants oxy ir in 3 hour intervals.  Will try for now. Increase neurontin to 300mg  TID. Pain control improving overall.  pamelor qhs for radicular pain also on board.  - fentanyl to better cover base line pain. Probably will need to increase to 25  -reassured pt that what he's experiencing is expected  3. Mood: Motivated to get better. Concerned about urinary retention.     4. Unstable burst fracture of first lumbar vertebra (04/14/2011)  Assessment: Decompressive lam with arthrodesis for stabilization on 11/20 Plan: TLSO if HOB greater than 30 degrees. Routine back precautions. CIR level therapies as above.  5. Closed fracture of left fibula (04/14/2011)  Assessment: ORIF for repair tibial pilon fx 11/29. Plan: Continue NWB LLE. Check with ortho before d/c re: potential casting  6. Laceration of fourth finger of left hand  (04/14/2011)  Assessment: repaired and healing nicely.  7. Anemia associated with acute blood loss (04/15/2011)  Assessment: Continue to monitor hgb for now. Add MVI for supplement due to constipation issues Plan: Will check serial H&H. Iron  8. Hypocalcemia (04/15/2011)  Plan: Level improved today. Will as supplement as indicated.  9. Recurrent urinary retention (04/27/2011)  Assessment: Due to neurogenic bladder with recurrent surgery, narcotic, immobility and CONSTIPATION.   -Enterococcus UTI- sensitive to cipro  -Voiding fairly well  10. Constipation: Scheduled miralax.  Bowels moving now  11. Dizziness: improved.  Pushing fluids.    LOS (Days) 6  Kimari Coudriet T 05/03/2011, 6:21 AM     A FACE TO FACE EXAM WAS PERFORMED

## 2011-05-04 LAB — CBC
HCT: 31.1 % — ABNORMAL LOW (ref 39.0–52.0)
Hemoglobin: 10.2 g/dL — ABNORMAL LOW (ref 13.0–17.0)
MCHC: 32.8 g/dL (ref 30.0–36.0)
MCV: 93.1 fL (ref 78.0–100.0)

## 2011-05-04 LAB — DIFFERENTIAL
Basophils Relative: 1 % (ref 0–1)
Eosinophils Relative: 6 % — ABNORMAL HIGH (ref 0–5)
Monocytes Absolute: 0.6 10*3/uL (ref 0.1–1.0)
Monocytes Relative: 10 % (ref 3–12)
Neutro Abs: 3.6 10*3/uL (ref 1.7–7.7)

## 2011-05-04 MED ORDER — FENTANYL 25 MCG/HR TD PT72
25.0000 ug | MEDICATED_PATCH | TRANSDERMAL | Status: DC
  Administered 2011-05-04 – 2011-05-07 (×2): 25 ug via TRANSDERMAL
  Filled 2011-05-04 (×2): qty 1

## 2011-05-04 NOTE — Progress Notes (Signed)
Patient tolerating therapy without pain meds . Patient reports need for oxycodone 15mg  after therapy complete.  TLSO brace on out of bed . Min assist to assist patient to apply back brace .  Min assist stand pivot . Continue with plan of care.                                                                                                                                   Cleotilde Neer

## 2011-05-04 NOTE — Progress Notes (Signed)
Occupational Therapy Session Note  Patient Details  Name: Norman Pearson MRN: [id number] Date of Birth: June 09, 1950  Today's Date: 05/04/2011 Time: 0700-0800 Time Calculation (min): 60 min  Precautions: Precautions Precautions: Back Required Braces or Orthoses: Yes Spinal Brace: Thoracolumbosacral orthotic;Applied in sitting position Restrictions Weight Bearing Restrictions: Yes LLE Weight Bearing: Non weight bearing  Short Term Goals: OT Short Term Goal 1: Pt will toilet with mod independence. OT Short Term Goal 2: Pt will transfer to toilet with mod independence. OT Short Term Goal 3: Pt will dress don shorts or pants with mod independence. OT Short Term Goal 4: Pt will bathe with set up.  Skilled Therapeutic Interventions/Progress Updates:    ADL retraining includinjg bathing and dressing.  UB bathing and dressing supine with HOB elevated approx 20 degrees.  Pt instructed therapist in correct positioning and technique for donning TLSO prior to sitting EOB.  Pt following back precautions with bed mobility.  Pt transferred to w/c with squat pivot transfer at supervision.  LB bathing/dressing with sit to stand at sink.  Pt utilizing AE appropriately to assist with tsks.  Focus on bed mobility, activity tolerance, AE use, adhering to back precautions, and instructing therapist appropriately for assistance.  Pt continues to experience pain in right leg but continues to participate in therapies.  Pain Pain Assessment Pain Assessment: 0-10 Pain Score:   4 Pain Type: Chronic pain Pain Location: Leg Pain Orientation: Right Pain Descriptors: Constant Pain Frequency: Intermittent Pain Onset: Gradual Pain Intervention(s): Medication (See eMAR)   Therapy/Group: Individual Therapy  Rich Brave 05/04/2011, 9:46 AM

## 2011-05-04 NOTE — Progress Notes (Signed)
Occupational Therapy Session Note  Patient Details  Name: Norman Pearson MRN: [id number] Date of Birth: May 07, 1951  Today's Date: 05/04/2011 Time: 4696-2952 Time Calculation (min): 30 min  Precautions: Precautions Precautions: Back Required Braces or Orthoses: Yes Spinal Brace: Thoracolumbosacral orthotic;Applied in sitting position Restrictions Weight Bearing Restrictions: Yes LLE Weight Bearing: Non weight bearing  Short Term Goals: OT Short Term Goal 1: Pt will toilet with mod independence. OT Short Term Goal 2: Pt will transfer to toilet with mod independence. OT Short Term Goal 3: Pt will dress don shorts or pants with mod independence. OT Short Term Goal 4: Pt will bathe with set up.  Skilled Therapeutic Interventions/Progress Updates:    Engaged in elevated toilet seat transfer without BSC and without grab bars; using rolling walker only. Also focused skilled intervention on right leg squats using correct body mechanics. Patient propelled w/c <-> ADL apartment from room.   Pain  No complaints of pain  Therapy/Group: Individual Therapy  Arielys Wandersee 05/04/2011, 11:14 AM

## 2011-05-04 NOTE — Progress Notes (Signed)
Physical Therapy Note  Patient Details  Name: Norman Pearson MRN: [id number] Date of Birth: 1950-08-22 Today's Date: 05/04/2011  Individual therapy [phone]. Denies acute pain, still c/o numbness in right lower extremity. Gait training with RW in home environment in ADL apartment, getting in and out of bed, taking items out of closet and setting them down on the bed, and various furniture transfers overall supervision. Discussed home set up of items pt needs access to in regards to back precautions. Verbalized understanding. Gait x 100' with RW, good stride length and safety noted with RW. Family had brought in w/c that was given to them, but it does not have arm rests, which pt uses to push up into standing and during transfers, nor does it have an elevating legrest for the left lower extremity. Recommended to pt these features would be beneficial for pt and and pt in agreement. Daughter is planning to come to afternoon session to practice real car transfer.  Karolee Stamps [facility] 05/04/2011, 11:37 AM

## 2011-05-04 NOTE — Progress Notes (Signed)
Patient ID: SHAHRAM ALEXOPOULOS, male   DOB: [birth year], 60 y.o.   MRN: [id number] Patient ID: VERLYN LAMBERT, male   DOB: 26-Jul-1950, 60 y.o.   MRN: [id number] Patient ID: ZYMIR NAPOLI, male   DOB: 01-24-1951, 60 y.o.   MRN: [id number] Patient ID: DELL BRINER, male   DOB: 02-25-1951, 60 y.o.   MRN: [id number] Patient ID: DELAWRENCE FRIDMAN, male   DOB: 02/28/1951, 60 y.o.   MRN: [id number] Patient ID: DESMON HITCHNER, male   DOB: 02-Nov-1950, 60 y.o.   MRN: [id number] Patient ID: SHREYAS PIATKOWSKI, male   DOB: 09/24/1950, 61 y.o.   MRN: [id number] Subjective/Complaints: ,Review of Systems  Musculoskeletal: Positive for back pain.  Neurological: Positive for tremors and sensory change.   Right thigh still burning at night but better. Left leg really sore after working hard yesterday.  Slept a little better  Objective: Vital Signs: Blood pressure 91/62, pulse 114, temperature 98.4 F (36.9 C), temperature source Oral, resp. rate 19, height 6' (1.829 m), weight 77.111 kg (170 lb), SpO2 97.00%. No results found.  Basename 05/04/11 0600  WBC 5.6  HGB 10.2*  HCT 31.1*  PLT 540*   No results found for this basename: NA:2,K:2,CL:2,CO2:2,GLUCOSE:2,BUN:2,CREATININE:2,CALCIUM:2 in the last 72 hours CBG (last 3)  No results found for this basename: GLUCAP:3 in the last 72 hours  Wt Readings from Last 3 Encounters:  04/29/11 77.111 kg (170 lb)  04/17/11 73.483 kg (162 lb)  04/17/11 73.483 kg (162 lb)    Physical Exam:  General appearance: alert Head: Normocephalic, without obvious abnormality, atraumatic Eyes: conjunctivae/corneas clear. PERRL, EOM's intact. Fundi benign. Ears: normal TM's and external ear canals both ears Nose: Nares normal. Septum midline. Mucosa normal. No drainage or sinus tenderness. Throat: lips, mucosa, and tongue normal; teeth and gums normal Neck: no adenopathy, no carotid bruit, no JVD, supple, symmetrical, trachea midline and thyroid not enlarged, symmetric, no  tenderness/mass/nodules Back: symmetric, no curvature. ROM normal. No CVA tenderness. Resp: clear to auscultation bilaterally Cardio: regular rate and rhythm, S1, S2 normal, no murmur, click, rub or gallop GI: soft, non-tender; bowel sounds normal; no masses,  no organomegaly Extremities: no edema, redness or tenderness in the calves or thighs and no ulcers, gangrene or trophic changes Pulses: 2+ and symmetric Skin: Skin color, texture, turgor normal. No rashes or lesions Neurologic: Grossly normal except for right leg with pain inhibition weakness as well as sensory loss and dysesthesias along antero-medial thigh. Strength in rle grossly 4/5 except for hip flexors which are 3-3+/5 Left foot nvi. Incision/Wound: back wound clean and intact.  Left leg in splint and intact.  Left hand wound clean.  TLSO upper right screw repaired   Assessment/Plan: 1. Functional deficits secondary to left distal tibial/proximal fibular fxs, L1 burst fx s/p decompression and fusion which require 3+ hours per day of interdisciplinary therapy in a comprehensive inpatient rehab setting. Physiatrist is providing close team supervision and 24 hour management of active medical problems listed below. Physiatrist and rehab team continue to assess barriers to discharge/monitor patient progress toward functional and medical goals.  Check with ortho this week re: f/u for left leg Mobility: Bed Mobility Rolling Right: 4: Min assist Rolling Left: 6: Modified independent (Device/Increase time);With rail Right Sidelying to Sit: 4: Min assist Sitting - Scoot to Edge of Bed: 5: Supervision Sit to Supine - Right: 2: Max assist Sit to Supine - Right Details (indicate cue type and reason): lift B LEs into bed Transfers Transfers:  Yes Sit to Stand: 4: Min assist Sit to Stand Details (indicate cue type and reason): c/o dizziness, min assist to steady balance Stand to Sit: 4: Min assist Stand Pivot Transfers: 3: Mod  assist Stand Pivot Transfer Details (indicate cue type and reason): lifting assist to stand, steadying assist for turning with RW Squat Pivot Transfers: 5: Supervision Squat Pivot Transfer Details (indicate cue type and reason): cues for technique Ambulation/Gait Ambulation/Gait Assistance: 4: Min assist Ambulation/Gait Assistance Details (indicate cue type and reason): unable to assess due to orthostatic hypotension Ambulation Distance (Feet): 40 Feet Assistive device: Rolling walker Wheelchair Mobility Wheelchair Mobility: Yes Wheelchair Assistance: 4: Min Education officer, museum: Both upper extremities Wheelchair Parts Management: Needs assistance Distance: 150 feet ADL:   Cognition: Cognition Overall Cognitive Status: Appears within functional limits for tasks assessed Orientation Level: Oriented X4 Cognition Orientation Level: Oriented X4   Medical Problem List and Plan:  1. DVT Prophylaxis/Anticoagulation: Mechanical: Antiembolism stockings, thigh (TED hose) Bilateral lower extremities  Sequential compression devices, below knee Bilateral lower extremities. lovenox  2. Pain Management:I suspsect some of right thigh pain is neuropathic.  Didn't like oxy cr.  Wants oxy ir in 3 hour intervals.  Will try for now. Increase neurontin to 300mg  TID. Pain control improving overall.  pamelor qhs for radicular pain also on board.  - fentanyl -- increase to  -reassured pt that what he's experiencing is expected  3. Mood: Motivated to get better. Concerned about urinary retention.     4. Unstable burst fracture of first lumbar vertebra (04/14/2011)  Assessment: Decompressive lam with arthrodesis for stabilization on 11/20 Plan: TLSO if HOB greater than 30 degrees. Routine back precautions. CIR level therapies as above.  -TLSO is a little large but i don't think it needs to be adjusted.  5. Closed fracture of left fibula (04/14/2011)  Assessment: ORIF for repair tibial  pilon fx 11/29. Plan: Continue NWB LLE. Check with ortho before d/c re: potential casting  6. Laceration of fourth finger of left hand (04/14/2011)  Assessment: repaired and healing nicely.  7. Anemia associated with acute blood loss (04/15/2011)  Assessment: Continue to monitor hgb for now. Add MVI for supplement due to constipation issues Plan: Iron:  H/H trending up  8. Hypocalcemia (04/15/2011)  Plan: Level improved today. Will as supplement as indicated.  9. Recurrent urinary retention (04/27/2011)  Assessment: Due to neurogenic bladder with recurrent surgery, narcotic, immobility and CONSTIPATION.   -Enterococcus UTI- sensitive to cipro  -Voiding fairly well  10. Constipation: Scheduled miralax.  Bowels moving now  11. Dizziness: improved.  Pushing fluids.    LOS (Days) 7  Callan Norden T 05/04/2011, 7:43 AM     A FACE TO FACE EXAM WAS PERFORMED

## 2011-05-04 NOTE — Progress Notes (Signed)
Physical Therapy Note  Patient Details  Name: Norman Pearson MRN: [id number] Date of Birth: 05-22-1951 Today's Date: 05/04/2011  Individual therapy 1345-1430. No c/o pain. Treatment session focused on family education with pt's daughter with real car transfer. Practiced squat pivot method and using RW for stand pivot. Pt more comfortable with use of RW for exiting the car. Daughter return demonstrated as well. Gait with RW on uneven surfaces with supervision.   Karolee Stamps [facility] 05/04/2011, 3:29 PM

## 2011-05-04 NOTE — Progress Notes (Signed)
Patient ID: Norman Pearson, male   DOB: Jan 29, 1951, 60 y.o.   MRN: [id number] Update faxed to Lucrezia Europe at [facility]:  Conf Note of 12/4 + current therapy notes & latest MD note.

## 2011-05-05 NOTE — Progress Notes (Signed)
Subjective: 2 weeks post op from ORIF of L tibial pilon and fibula fractures.  In rehab for the last 10 days.  Heading home on Thursday.  No c/o pain.  No f/c/n/v.  Denies numbness, tingling or weakness in L ankle or L ring finger.  Sutures removed from L ring finger earlier today.  Patient reports pain as 0 on 0-10 scale.    Objective: Vital signs in last 24 hours: Temp:  [98.4 F (36.9 C)-98.6 F (37 C)] 98.6 F (37 C) (12/11 1500) Pulse Rate:  [93-104] 104  (12/11 1500) Resp:  [20] 20  (12/11 1500) BP: (98-132)/(68-82) 124/82 mmHg (12/11 1500) SpO2:  [96 %] 96 % (12/11 1500)   Basename 05/04/11 0600  HGB 10.2*    Basename 05/04/11 0600  WBC 5.6  RBC 3.34*  HCT 31.1*  PLT 540*   splint removed.  skin healing with no signs of infection.  scant serous drainage from medial and lateral heel wounds.  Sutures still in place anteriorly.  Feels LT on dorsum of foot.    Assessment/Plan:     Up with therapy  Continue NWB on LLE.  Leave sutures.  New cast applied today.  F/u with me in the office in 2-3 weeks.  Norman Pearson 05/05/2011, 3:41 PM

## 2011-05-05 NOTE — Progress Notes (Signed)
Occupational Therapy Weekly Progress Note & Session Note  Patient Details  Name: Norman Pearson MRN: [id number] Date of Birth: 08-14-1950  Today's Date: 05/05/2011 Time: 0700-0745 Time Calculation (min): 45 min  Patient has met 1 of 4 short term goals.   Pt making steady progress since admission.  Pt is supervision for bathing, dressing, toilet transfers and toileting.  Shower transfers have not been addressed, but have been simulated with patient requiring minimal assistance.  Pt has walk in shower at home with no ledge. Recommend 3-in-1 for safe shower transfers and bathing in sit to stand position.  Pt continues to require rest breaks during therapy sessions and min verbal cues for safety awareness when in w/c.   Patient continues to demonstrate the following deficits: decreased balance, fluctuating blood pressure during activities, increased anxiety, and decreased safety awareness in w/c. Therefore patient will continue to benefit from skilled OT intervention to enhance overall performance with ADLs and IADLs.  Patient progressing toward long term goals..  Continue plan of care.  Patients Long Term Goals set at an overall supervision level for aspects of daily living.   OT Short Term Goals OT Short Term Goal 1: Pt will toilet with mod independence. OT Short Term Goal 1 - Progress: Not met OT Short Term Goal 2: Pt will transfer to toilet with mod independence. OT Short Term Goal 2 - Progress: Not met OT Short Term Goal 3: Pt will dress don shorts or pants with mod independence. OT Short Term Goal 3 - Progress: Not met OT Short Term Goal 4: Pt will bathe with set up. OT Short Term Goal 4 - Progress: Met  Therapy Documentation Precautions: Precautions Precautions: Back Required Braces or Orthoses: Yes Spinal Brace: Thoracolumbosacral orthotic;Applied in sitting position Restrictions Weight Bearing Restrictions: Yes LLE Weight Bearing: Non weight bearing  Skilled Therapeutic  Interventions/Progress Updates:    ADL retraining including bathing and dressing.  Pt in bed with TLSO already donned.  Pt declined to remove TLSO to bathe chest/abdomen and change shirts.  Pt amb with r/w to w/c at sink for LB bathing and dressing.  Pt completed tasks using AE to assist while following back precautions.  Pt completed grooming tasks and washing hair while seated at sink in w/c.  No complaints of dizziness.  Focus on safety awareness, standing balance, and functional amb with r/w for home mgmt tasks.  Pain Pain Assessment Pain Assessment: 0-10 Pain Score:   5 Pain Type: Acute pain Pain Location: Leg Pain Orientation: Right Pain Descriptors: Aching Pain Onset: On-going Pain Intervention(s): RN made aware ADL ADL Equipment Provided: Reacher;Long-handled sponge Grooming: Modified independent Where Assessed-Grooming: Sitting at sink;Wheelchair Upper Body Bathing: Setup Where Assessed-Upper Body Bathing: Sitting at sink;Wheelchair;Bed level Lower Body Bathing: Supervision/safety Where Assessed-Lower Body Bathing: Standing at sink;Sitting at sink;Wheelchair Upper Body Dressing: Not assessed Where Assessed-Upper Body Dressing: Bed level Lower Body Dressing: Supervision/safety Where Assessed-Lower Body Dressing: Standing at sink;Sitting at sink;Wheelchair  Therapy/Group: Individual Therapy  Rich Brave 05/05/2011, 7:58 AM

## 2011-05-05 NOTE — Progress Notes (Signed)
Physical Therapy Session Note  Patient Details  Name: Norman Pearson MRN: [id number] Date of Birth: 05-Jul-1950  Today's Date: 05/05/2011 Time: 4098-1191 Time Calculation (min): 44 min  Precautions: Precautions Precautions: Back Required Braces or Orthoses: Yes Spinal Brace: Thoracolumbosacral orthotic;Applied in sitting position Restrictions Weight Bearing Restrictions: Yes LLE Weight Bearing: Non weight bearing  Pain Pain Assessment Pain Assessment: 0-10 Pain Score:   2 Pain Type: Acute pain;Neuropathic pain Pain Location: Back Pain Orientation: Lower Pain Descriptors: Radiating Pain Onset: On-going Patients Stated Pain Goal: 0 Pain Intervention(s): Rest  Exercises  Patient performed UE and core strengthening in unsupported sitting on mat with and without feet supported during 2 sets x 10 reps with 5 lb bar: UE forward flexion to 90, overhead press, chest press <> scap retraction, bicep curl; with orange theraband performed 2 sets x 10 reps scap retraction rows, tricep extensions, lat pull downs; with push up blocks performed tricep/lat push ups x 2 sets x 10 reps; 4 minutes performed dynamic reaching out of BOS and endurance during volleyball all with cues for TA activation for lumbar support to decrease activation of erector spinae muscles; performed resisted w/c mobility x 150 ft on level tile surface.  Patient tolerated all exercises well; if back pain reported exercise was modified to decrease strain on back.  Therapy/Group: Individual Therapy  [facility] 05/05/2011, 4:22 PM

## 2011-05-05 NOTE — Progress Notes (Signed)
Subjective/Complaints: ,Review of Systems  Musculoskeletal: Positive for back pain.  Neurological: Positive for tremors and sensory change.   a little sore, but pain overall better.  A little numb over left anterolateral thigh.  Objective: Vital Signs: Blood pressure 98/68, pulse 100, temperature 98.4 F (36.9 C), temperature source Oral, resp. rate 20, height 6' (1.829 m), weight 77.111 kg (170 lb), SpO2 96.00%. No results found.  Basename 05/04/11 0600  WBC 5.6  HGB 10.2*  HCT 31.1*  PLT 540*   No results found for this basename: NA:2,K:2,CL:2,CO2:2,GLUCOSE:2,BUN:2,CREATININE:2,CALCIUM:2 in the last 72 hours CBG (last 3)  No results found for this basename: GLUCAP:3 in the last 72 hours  Wt Readings from Last 3 Encounters:  04/29/11 77.111 kg (170 lb)  04/17/11 73.483 kg (162 lb)  04/17/11 73.483 kg (162 lb)    Physical Exam:  General appearance: alert Head: Normocephalic, without obvious abnormality, atraumatic Eyes: conjunctivae/corneas clear. PERRL, EOM's intact. Fundi benign. Ears: normal TM's and external ear canals both ears Nose: Nares normal. Septum midline. Mucosa normal. No drainage or sinus tenderness. Throat: lips, mucosa, and tongue normal; teeth and gums normal Neck: no adenopathy, no carotid bruit, no JVD, supple, symmetrical, trachea midline and thyroid not enlarged, symmetric, no tenderness/mass/nodules Back: symmetric, no curvature. ROM normal. No CVA tenderness. Resp: clear to auscultation bilaterally Cardio: regular rate and rhythm, S1, S2 normal, no murmur, click, rub or gallop GI: soft, non-tender; bowel sounds normal; no masses,  no organomegaly Extremities: no edema, redness or tenderness in the calves or thighs and no ulcers, gangrene or trophic changes Pulses: 2+ and symmetric Skin: Skin color, texture, turgor normal. No rashes or lesions Neurologic: Grossly normal except for right leg with pain inhibition weakness as well as sensory loss and  dysesthesias along antero-medial thigh. Strength in rle grossly 4/5 except for hip flexors which are 3-3+/5 Left foot nvi. Incision/Wound: back wound clean and intact.  Left leg in splint and intact.  Left hand wound clean.  TLSO upper right screw repaired   Assessment/Plan: 1. Functional deficits secondary to left distal tibial/proximal fibular fxs, L1 burst fx s/p decompression and fusion which require 3+ hours per day of interdisciplinary therapy in a comprehensive inpatient rehab setting. Physiatrist is providing close team supervision and 24 hour management of active medical problems listed below. Physiatrist and rehab team continue to assess barriers to discharge/monitor patient progress toward functional and medical goals.  Left thigh numbness may be related to TLSO. Consider a trim of inferior aspect. Mobility: Bed Mobility Rolling Right: 4: Min assist Rolling Left: 6: Modified independent (Device/Increase time);With rail Right Sidelying to Sit: 4: Min assist Sitting - Scoot to Edge of Bed: 5: Supervision Sit to Supine - Right: 2: Max assist Sit to Supine - Right Details (indicate cue type and reason): lift B LEs into bed Transfers Transfers: Yes Sit to Stand: 4: Min assist Sit to Stand Details (indicate cue type and reason): c/o dizziness, min assist to steady balance Stand to Sit: 4: Min assist Stand Pivot Transfers: 3: Mod assist Stand Pivot Transfer Details (indicate cue type and reason): lifting assist to stand, steadying assist for turning with RW Squat Pivot Transfers: 5: Supervision Squat Pivot Transfer Details (indicate cue type and reason): cues for technique Ambulation/Gait Ambulation/Gait Assistance: 4: Min assist Ambulation/Gait Assistance Details (indicate cue type and reason): unable to assess due to orthostatic hypotension Ambulation Distance (Feet): 40 Feet Assistive device: Rolling walker Wheelchair Mobility Wheelchair Mobility: Yes Wheelchair Assistance:  4: Min Education officer, museum:  Both upper extremities Wheelchair Parts Management: Needs assistance Distance: 150 feet ADL:   Cognition: Cognition Overall Cognitive Status: Appears within functional limits for tasks assessed Orientation Level: Oriented X4 Cognition Orientation Level: Oriented X4   Medical Problem List and Plan:  1. DVT Prophylaxis/Anticoagulation: Mechanical: Antiembolism stockings, thigh (TED hose) Bilateral lower extremities  Sequential compression devices, below knee Bilateral lower extremities. lovenox.  2. Pain Management:I suspsect some of right thigh pain is neuropathic.  Didn't like oxy cr.  Wants oxy ir in 3 hour intervals.  Will try for now. Increase neurontin to 300mg  TID. Pain control improving overall.  pamelor qhs for radicular pain also on board.  - fentanyl -- increase to  -reassured pt that what he's experiencing is expected  -anxiety factor  3. Mood: Motivated to get better. Concerned about urinary retention.     4. Unstable burst fracture of first lumbar vertebra (04/14/2011)  Assessment: Decompressive lam with arthrodesis for stabilization on 11/20 Plan: TLSO if HOB greater than 30 degrees. Routine back precautions. CIR level therapies as above.  -TLSO is a little large. Will have [employer] trim today.  5. Closed fracture of left fibula (04/14/2011)  Assessment: ORIF for repair tibial pilon fx 11/29. Plan: Continue NWB LLE.  Ortho f/u before d/c or shortly therafter.  6. Laceration of fourth finger of left hand (04/14/2011)  Assessment: repaired and healing nicely.  7. Anemia associated with acute blood loss (04/15/2011)  Assessment: Continue to monitor hgb for now. Add MVI for supplement due to constipation issues Plan: Iron:  H/H trending up  8. Hypocalcemia (04/15/2011)  Plan: Level improved today. Will as supplement as indicated.  9. Recurrent urinary retention (04/27/2011)  Assessment: Due to neurogenic bladder with  recurrent surgery, narcotic, immobility and CONSTIPATION.   -Enterococcus UTI- sensitive to cipro  -Voiding fairly well  10. Constipation: Scheduled miralax.  Bowels moving now  11. Dizziness: improved.  Pushing fluids.    LOS (Days) 8  SWARTZ,ZACHARY T 05/05/2011, 7:40 AM     A FACE TO FACE EXAM WAS PERFORMED

## 2011-05-05 NOTE — Progress Notes (Signed)
Physical Therapy Note  Patient Details  Name: ADRIAN DINOVO MRN: [id number] Date of Birth: 1950/08/26 Today's Date: 05/05/2011  TIME IN/OUT  10:00-10:55 Individual therapy  Pain- none  Skilled intervention: There-ex 10 x 3 seated LAQ and standing hip flexion, extension, abduction and knee flexion for strentghening and muscle endurance to aid mobility; Nustep level 7 without LLE for cardiovascular exercise HR 128 Met 2.8  Therapeutic activity: balance training playing shuffle board  With one UE support S; mod I transfers with use of one UE   Gait training with RW NWB LLE with cognitive tasks to challenge balance (counting backwards by 3's, divergent naming tasks) with no increase in unsteadiness,gait  forwards and backwards, vc to look up No c/o dizziness during session, only fatigue  Michaelene Song 05/05/2011, 3:25 PM

## 2011-05-05 NOTE — Progress Notes (Signed)
Patient is alert and oriented x3. He has not requested pain meds this shift. Sutures removed from left ring finger as ordered. Orthopedic MD placed permanent cast on patient. [employer] came to evaluate TLSO. Per [employer] and patient TLSO did not need to be readjusted at this time. Continue current plan of care.

## 2011-05-05 NOTE — Progress Notes (Signed)
Physical Therapy Note  Patient Details  Name: Norman Pearson MRN: [id number] Date of Birth: [birth year] Today's Date: 05/05/2011  11:15 - 12:00=  Individual therapy  4/10 pain in rt. Leg from back.  Gait trial with crutches 40'. Pt. Min assist but did not feel stable enough at this time.  Pt. Complained of lightheadedness BP 118/80 in sitting.  erogometer 10 minutes alternating between forward and backwards for UE strength and endurance.   Julian Reil 05/05/2011, 12:22 PM

## 2011-05-06 NOTE — Progress Notes (Signed)
Therapeutic Recreation Discharge Summary Patient Details  Name: Norman Pearson MRN: [id number] Date of Birth: 28-Jul-1950  Long term goals set: 1  Long term goals met: 1  Comments on progress toward goals: Pt has made good progress toward goal and is currently supervision level.  Pt is able to identify barriers and potential (safe) ways to negotiate them as well as state energy conservation techniques.  Pt is ready for d/c home tomorrow.  Reasons goals not met: n/a  Equipment acquired: n/a  Reasons for discharge: discharge from hospital  Patient/family agrees with progress made and goals achieved: Yes  Braelyn Jenson 05/06/2011, 4:59 PM

## 2011-05-06 NOTE — Progress Notes (Signed)
Occupational Therapy Note  Patient Details  Name: SYRIS BROOKENS MRN: [id number] Date of Birth: May 26, 1950 Today's Date: 05/06/2011  Time: 1430-1500 ( ) No c/o pain  1:1 Therapeutic Exercise: pt with tight hamstrings and soreness in quads from earlier activity, hamstring stretches, short arc quads with upright posture in bilateral LEs, functional ambulation for long distances; bed mobility mod I and A to doff brace.   Melonie Florida 05/06/2011, 3:51 PM

## 2011-05-06 NOTE — Progress Notes (Signed)
Subjective/Complaints: ,Review of Systems  Musculoskeletal: Positive for back pain.  Neurological: Positive for tremors and sensory change.   a little sore, but pain overall better.  A little numb over left anterolateral thigh.  Objective: Vital Signs: Blood pressure 83/62, pulse 99, temperature 99.3 F (37.4 C), temperature source Oral, resp. rate 20, height 6' (1.829 m), weight 77.111 kg (170 lb), SpO2 98.00%. No results found.  Basename 05/04/11 0600  WBC 5.6  HGB 10.2*  HCT 31.1*  PLT 540*   No results found for this basename: NA:2,K:2,CL:2,CO2:2,GLUCOSE:2,BUN:2,CREATININE:2,CALCIUM:2 in the last 72 hours CBG (last 3)  No results found for this basename: GLUCAP:3 in the last 72 hours  Wt Readings from Last 3 Encounters:  04/29/11 77.111 kg (170 lb)  04/17/11 73.483 kg (162 lb)  04/17/11 73.483 kg (162 lb)    Physical Exam:  General appearance: alert Head: Normocephalic, without obvious abnormality, atraumatic Eyes: conjunctivae/corneas clear. PERRL, EOM's intact. Fundi benign. Ears: normal TM's and external ear canals both ears Nose: Nares normal. Septum midline. Mucosa normal. No drainage or sinus tenderness. Throat: lips, mucosa, and tongue normal; teeth and gums normal Neck: no adenopathy, no carotid bruit, no JVD, supple, symmetrical, trachea midline and thyroid not enlarged, symmetric, no tenderness/mass/nodules Back: symmetric, no curvature. ROM normal. No CVA tenderness. Resp: clear to auscultation bilaterally Cardio: regular rate and rhythm, S1, S2 normal, no murmur, click, rub or gallop GI: soft, non-tender; bowel sounds normal; no masses,  no organomegaly Extremities: no edema, redness or tenderness in the calves or thighs and no ulcers, gangrene or trophic changes.  Cast LLE. Moving toes well. Pulses: 2+ and symmetric Skin: Skin color, texture, turgor normal. No rashes or lesions Neurologic: Grossly normal except for right leg with pain inhibition weakness as  well as sensory loss and dysesthesias along antero-medial thigh. Strength in rle grossly 4/5 except for hip flexors which are 3-3+/5 Left foot nvi. Incision/Wound: back wound clean and intact.  Left hand wound clean.  TLSO upper right screw repaired   Assessment/Plan: 1. Functional deficits secondary to left distal tibial/proximal fibular fxs, L1 burst fx s/p decompression and fusion which require 3+ hours per day of interdisciplinary therapy in a comprehensive inpatient rehab setting. Physiatrist is providing close team supervision and 24 hour management of active medical problems listed below. Physiatrist and rehab team continue to assess barriers to discharge/monitor patient progress toward functional and medical goals.  Left thigh numbness may be related to TLSO. Consider a trim of inferior aspect. Mobility: Bed Mobility Rolling Right: 4: Min assist Rolling Left: 6: Modified independent (Device/Increase time);With rail Right Sidelying to Sit: 4: Min assist Sitting - Scoot to Edge of Bed: 5: Supervision Sit to Supine - Right: 2: Max assist Sit to Supine - Right Details (indicate cue type and reason): lift B LEs into bed Transfers Transfers: Yes Sit to Stand: 4: Min assist Sit to Stand Details (indicate cue type and reason): c/o dizziness, min assist to steady balance Stand to Sit: 4: Min assist Stand Pivot Transfers: 3: Mod assist Stand Pivot Transfer Details (indicate cue type and reason): lifting assist to stand, steadying assist for turning with RW Squat Pivot Transfers: 5: Supervision Squat Pivot Transfer Details (indicate cue type and reason): cues for technique Ambulation/Gait Ambulation/Gait Assistance: 5: Supervision Ambulation/Gait Assistance Details (indicate cue type and reason): unable to assess due to orthostatic hypotension Ambulation Distance (Feet): 40 Feet Assistive device: Rolling walker Wheelchair Mobility Wheelchair Mobility: Yes Wheelchair Assistance: 4:  Min Education officer, museum: Both upper  extremities Wheelchair Parts Management: Needs assistance Distance: 150 feet ADL:   Cognition: Cognition Overall Cognitive Status: Appears within functional limits for tasks assessed Arousal/Alertness: Awake/alert Orientation Level: Oriented X4 Cognition Arousal/Alertness: Awake/alert Orientation Level: Oriented X4   Medical Problem List and Plan:  1. DVT Prophylaxis/Anticoagulation: Mechanical: Antiembolism stockings, thigh (TED hose) Bilateral lower extremities  Sequential compression devices, below knee Bilateral lower extremities. lovenox.  2. Pain Management:I suspsect some of right thigh pain is neuropathic.Pain better and using less narcotics.    On neurontin to 300mg  TID.   pamelor qhs for radicular pain also on board.  - fentanyl -- increased to  -reassured pt that what he's experiencing is expected  -anxiety factor  3. Mood: Motivated to get better. Concerned about urinary retention.     4. Unstable burst fracture of first lumbar vertebra (04/14/2011)  Assessment: Decompressive lam with arthrodesis for stabilization on 11/20 Plan: TLSO if HOB greater than 30 degrees. Routine back precautions. CIR level therapies as above.  -TLSO is a little large. Will have [employer] trim today.  5. Closed fracture of left fibula (04/14/2011)  Assessment: ORIF for repair tibial pilon fx 11/29. Plan: Continue NWB LLE.  Changed to cast and it appears a little tight.  Encouraged patient to elevate as much as possible to prevent edema.  6. Laceration of fourth finger of left hand (04/14/2011)  Assessment: repaired and healing nicely.  7. Anemia associated with acute blood loss (04/15/2011)  Assessment: Continue to monitor hgb for now. Add MVI for supplement due to constipation issues Plan: Iron:  H/H trending up  8. Hypocalcemia (04/15/2011)  Plan: Level improved today. Will as supplement as indicated.  9.Resolved urinary retention  (04/27/2011)  Assessment: Due to neurogenic bladder with recurrent surgery, narcotic, immobility and CONSTIPATION.   -Enterococcus UTI- sensitive to cipro  -Voiding fairly well  10. Constipation: Scheduled miralax.  Bowels moving now  11. Dizziness: improved.  Pushing fluids.    LOS (Days) 9  Love, Evlyn Kanner 05/06/2011, 8:13 AM     A FACE TO FACE EXAM WAS PERFORMED

## 2011-05-06 NOTE — Progress Notes (Signed)
Physical Therapy Note  Patient Details  Name: Norman Pearson MRN: [id number] Date of Birth: Aug 03, 1950 Today's Date: 05/06/2011  Time: 1300-1400 60 minutes Group Therapy  Pt participated in walking group with gait training in controlled environment, obstacle negotiation, side and backward stepping, and curb negotiation with RW.  Standing dynamic balance activities with reaching and tossing with 1 UE support.  Pt demo's good safety and good activity tolerance.   Marrissa Dai 05/06/2011, 3:27 PM

## 2011-05-06 NOTE — Progress Notes (Signed)
Patient ID: Norman Pearson, male   DOB: Oct 27, 1950, 60 y.o.   MRN: [id number] Subjective/Complaints: ,Review of Systems  Musculoskeletal: Positive for back pain.  Neurological: Positive for tremors and sensory change.   a little sore, but pain overall better.  A little numb over left anterolateral thigh.  Objective: Vital Signs: Blood pressure 83/62, pulse 99, temperature 99.3 F (37.4 C), temperature source Oral, resp. rate 20, height 6' (1.829 m), weight 77.111 kg (170 lb), SpO2 98.00%. No results found.  Basename 05/04/11 0600  WBC 5.6  HGB 10.2*  HCT 31.1*  PLT 540*   No results found for this basename: NA:2,K:2,CL:2,CO2:2,GLUCOSE:2,BUN:2,CREATININE:2,CALCIUM:2 in the last 72 hours CBG (last 3)  No results found for this basename: GLUCAP:3 in the last 72 hours  Wt Readings from Last 3 Encounters:  04/29/11 77.111 kg (170 lb)  04/17/11 73.483 kg (162 lb)  04/17/11 73.483 kg (162 lb)    Physical Exam:  General appearance: alert Head: Normocephalic, without obvious abnormality, atraumatic Eyes: conjunctivae/corneas clear. PERRL, EOM's intact. Fundi benign. Ears: normal TM's and external ear canals both ears Nose: Nares normal. Septum midline. Mucosa normal. No drainage or sinus tenderness. Throat: lips, mucosa, and tongue normal; teeth and gums normal Neck: no adenopathy, no carotid bruit, no JVD, supple, symmetrical, trachea midline and thyroid not enlarged, symmetric, no tenderness/mass/nodules Back: symmetric, no curvature. ROM normal. No CVA tenderness. Resp: clear to auscultation bilaterally Cardio: regular rate and rhythm, S1, S2 normal, no murmur, click, rub or gallop GI: soft, non-tender; bowel sounds normal; no masses,  no organomegaly Extremities: no edema, redness or tenderness in the calves or thighs and no ulcers, gangrene or trophic changes Pulses: 2+ and symmetric Skin: Skin color, texture, turgor normal. No rashes or lesions Neurologic: Grossly normal  except for right leg with pain inhibition weakness as well as sensory loss and dysesthesias along antero-medial thigh. Strength in rle grossly 4/5 except for hip flexors which are 3-3+/5 Left foot nvi. Incision/Wound: back wound clean and intact.  Left leg in splint and intact.  Left hand wound clean.  TLSO upper right screw repaired   Assessment/Plan: 1. Functional deficits secondary to left distal tibial/proximal fibular fxs, L1 burst fx s/p decompression and fusion which require 3+ hours per day of interdisciplinary therapy in a comprehensive inpatient rehab setting. Physiatrist is providing close team supervision and 24 hour management of active medical problems listed below. Physiatrist and rehab team continue to assess barriers to discharge/monitor patient progress toward functional and medical goals.  Finalizing d/c planning for tomorrow. Mobility: Bed Mobility Rolling Right: 4: Min assist Rolling Left: 6: Modified independent (Device/Increase time);With rail Right Sidelying to Sit: 4: Min assist Sitting - Scoot to Edge of Bed: 5: Supervision Sit to Supine - Right: 2: Max assist Sit to Supine - Right Details (indicate cue type and reason): lift B LEs into bed Transfers Transfers: Yes Sit to Stand: 4: Min assist Sit to Stand Details (indicate cue type and reason): c/o dizziness, min assist to steady balance Stand to Sit: 4: Min assist Stand Pivot Transfers: 3: Mod assist Stand Pivot Transfer Details (indicate cue type and reason): lifting assist to stand, steadying assist for turning with RW Squat Pivot Transfers: 5: Supervision Squat Pivot Transfer Details (indicate cue type and reason): cues for technique Ambulation/Gait Ambulation/Gait Assistance: 5: Supervision Ambulation/Gait Assistance Details (indicate cue type and reason): unable to assess due to orthostatic hypotension Ambulation Distance (Feet): 40 Feet Assistive device: Rolling walker Wheelchair Mobility Wheelchair  Mobility: Yes Wheelchair  Assistance: 4: Min Education officer, museum: Both upper extremities Wheelchair Parts Management: Needs assistance Distance: 150 feet ADL:   Cognition: Cognition Overall Cognitive Status: Appears within functional limits for tasks assessed Arousal/Alertness: Awake/alert Orientation Level: Oriented X4 Cognition Arousal/Alertness: Awake/alert Orientation Level: Oriented X4   Medical Problem List and Plan:  1. DVT Prophylaxis/Anticoagulation: Mechanical: Antiembolism stockings, thigh (TED hose) Bilateral lower extremities  Sequential compression devices, below knee Bilateral lower extremities. lovenox.  2. Pain Management:I suspsect some of right thigh pain is neuropathic.  Didn't like oxy cr.  Wants oxy ir in 3 hour intervals.  Will try for now. Increase neurontin to 300mg  TID. Pain control improving overall.  pamelor qhs for radicular pain also on board.  - fentanyl -- increase to  -reassured pt that what he's experiencing is expected  -anxiety factor.  Overall improved.  3. Mood: Motivated to get better. Concerned about urinary retention.     4. Unstable burst fracture of first lumbar vertebra (04/14/2011)  Assessment: Decompressive lam with arthrodesis for stabilization on 11/20 Plan: TLSO if HOB greater than 30 degrees. Routine back precautions. CIR level therapies as above.  -TLSO is a little large. Will have [employer] trim today.  5. Closed fracture of left fibula (04/14/2011)  Assessment: ORIF for repair tibial pilon fx 11/29. Plan: Continue NWB LLE.  Fiberglass cast applied.   6. Laceration of fourth finger of left hand (04/14/2011)  Assessment: repaired and healing nicely.  7. Anemia associated with acute blood loss (04/15/2011)  Assessment: Continue to monitor hgb for now. Add MVI for supplement due to constipation issues Plan: Iron:  H/H trending up  8. Hypocalcemia (04/15/2011)  Plan: Level improved today. Will as supplement as  indicated.  9. Recurrent urinary retention (04/27/2011)  Assessment: Due to neurogenic bladder with recurrent surgery, narcotic, immobility and CONSTIPATION.   -Enterococcus UTI treated  -Voiding fairly well  10. Constipation: Scheduled miralax.  Bowels moving now  11. Dizziness: improved.  Pushing fluids.    LOS (Days) 9  SWARTZ,ZACHARY T 05/06/2011, 8:50 AM     A FACE TO FACE EXAM WAS PERFORMED

## 2011-05-06 NOTE — Progress Notes (Signed)
Patient is alert and oriented x3. He has not requested pain meds this shift. TLSO worn as ordered. Left cast intact. Continue current plan of care.

## 2011-05-06 NOTE — Progress Notes (Signed)
Patient Details  Name: DEWIE AHART MRN: [id number] Date of Birth: Oct 19, 1950  Today's Date: 05/06/2011 Time: 16:00-16:23  Skilled Therapeutic Interventions/Progress Updates: Met with patient for community reintegration task and discussed energy conservation techniques, safety concerns, identification and negotiation of obstacles.  No c/o pain.  Therapy/Group: Community Reintegration  Activity Level: Simple:  Level of assist: Supervision  Heyward Douthit 05/06/2011, 4:53 PM

## 2011-05-06 NOTE — Progress Notes (Signed)
Occupational Therapy Session Note and Discharge Summary  Patient Details  Name: Norman Pearson MRN: [id number] Date of Birth: July 13, 1950 Today's Date: 05/06/2011  Skilled Intervention Time: [phone]; 45 mins Pain: unrated but pt c/o slight discomfort in right LE ADL retraining including UB bathing and dressing at bed level.  Pt directed therapist with donning TLSO at bed level.  Pt completed LB bathing and dressing seated in w/c at sink with sit to stand and using AE appropriately to perform tasks.  Pt compliant with LLE WBing status.  Focus on safety awareness, activity tolerance, and dynamic standing balance.  Discharge Summary Patient has met 5 of 5 long term goals due to improved activity tolerance, improved balance, ability to compensate for deficits and improved coordination.   Pt has progressed well this  Admission. No family has been present for family education or training. Pt is supervision for UB bathing and dressing at bed level and seated at sink in w/c for LB bathing and dressing using AE appropriately, and mod I for toilet transfers and toileting.  Pt has demonstrated no unsafe behaviors and correctly directs care givers with donning/doffing TLSO.   See FIM and Discharge Navigator for subjective and objective measures.   Recommendation:  No additional OT recommended at this time.   Equipment: No equipment provided Pt has BSC provided by neighbor  Patient/family agrees with progress made and goals achieved: Yes  Rich Brave 05/06/2011, 10:47 AM

## 2011-05-06 NOTE — Progress Notes (Signed)
Physical Therapy Session Note  Patient Details  Name: Norman Pearson MRN: [id number] Date of Birth: [birth year]  Today's Date: 05/06/2011 Time:1015-1105 Time Calculation (min):50 min  Precautions: Precautions Precautions: Back Required Braces or Orthoses: Yes Spinal Brace: Thoracolumbosacral orthotic;Applied in sitting position Restrictions Weight Bearing Restrictions: Yes LLE Weight Bearing: Non weight bearing   Skilled Therapeutic Interventions:     General Amount of Missed PT Time (min): 10 Minutes Vital Signs Therapy Vitals Pulse Rate: 98  BP: 115/72 mmHg (at rest) Patient Position, if appropriate: Sitting Pain Pain Assessment Pain Assessment: No/denies pain (c/o numbness R thigh)   Locomotion  Ambulation Ambulation/Gait Assistance: 5: Supervision Gait training x 160' with S , pt holding LLE up with hip flexors, VCs for keeping R foot within area of walker.       Balance Balance Balance Assessed: Yes Static Standing Balance Static Standing - Level of Assistance: 5: Stand by assistance Dynamic Standing Balance Dynamic Standing - Level of Assistance: 5: Stand by assistance   Exercises: UBE backwards x 5 min at level 7.5, rated 13 on Borg exertion scale,  for shoulder and upper back strengthening for ambulation with RW; W/C press-ups/boosts, clearing hips by several inches, using bil UEs only, x 10. Bil UE/back strengthening, wearing 2# on wrist, chest high volleying and punching with beach ball, x 5 minutes.     Other Treatments  Standing tolerance at high table, x 3 minutes while using 1UE for fine motor task, other UE for support.  Therapy/Group: Individual Therapy  Clotiel Troop 05/06/2011, 4:06 PM

## 2011-05-06 NOTE — Patient Care Conference (Signed)
Inpatient RehabilitationTeam Conference Note Date: 05/05/2011   Time: 1:40pm    Patient Name: Norman Pearson      Medical Record Number: [id number]  Date of Birth: April 07, 1951 Sex: Male         Room/Bed: 4147/4147-01 Payor Info: Payor: BLUE CROSS BLUE [employer]IELD  Plan: BCBS [place] PPO  Product Type: *No Product type*     Admitting Diagnosis: lt burst fx lt tibial plat fx  Admit Date/Time:  04/27/2011  4:53 PM Admission Comments: No comment available   Primary Diagnosis:  <principal problem not specified> Principal Problem: <principal problem not specified>  Patient Active Problem List  Diagnoses Date Noted  . Acute urinary retention 04/27/2011  . Anemia associated with acute blood loss 04/15/2011  . Hypocalcemia 04/15/2011  . Unstable burst fracture of first lumbar vertebra 04/14/2011  . Accidental fall from ladder 04/14/2011  . Closed fracture of left fibula 04/14/2011  . Laceration of fourth finger of left hand 04/14/2011  . Closed fracture of left distal tibia 04/14/2011    Expected Discharge Date: Expected Discharge Date: 05/07/11  Team Members Present: Physician: Dr. Faith Rogue Case Manager Present: Melanee Spry, RN Social Worker Present: Amada Jupiter, LCSW OT Present: Edwin Cap, OT RN Present: Rosebud Poles    Current Status/Progress Goal Weekly Team Focus  Medical   Pain and issue but improving. Neurologically patient showing improvements in right lower extremity. Bladder function improved  Pain management adequate enough to allow therapy participation. Regular bowel bladder habits  Patient education and optimization of pain regimen.   Bowel/Bladder   continent lbm 12-9  mod independent   continue with plan of care    Swallow/Nutrition/ Hydration             ADL's    See OT Team Conf documentation of 05/04/11    See OT Team Conf documentation of 05/04/11   See OT Team Conf documentation  Mobility   supervision overall  mod I. overall except to don TLSO  activity  tolerance   Communication             Safety/Cognition/ Behavioral Observations            Pain   oxycodone 15mg  after therapy fentanyl patch intact   pain less than 3 on scale 0-10  monitor effectiveness of pain meds    Skin   sutures lt ring finger to be d/c'd lumbar incision with dermabond resoved rash   no new breakdown  continue with plan of care      *See Interdisciplinary Assessment and Plan and progress notes for long and short-term goals  Barriers to Discharge: None identified currently    Possible Resolutions to Barriers:       Discharge Planning/Teaching Needs:  Home with wife who can provide supervision.  Daughter close by and provide intermittent support.      Team Discussion: Ortho knows about d/c date-may be able to put cast on L leg before d/c.  Will plan some HH initially-do OP therapy later when pt can WB.   Revisions to Treatment Plan: none    Continued Need for Acute Rehabilitation Level of Care: The patient requires daily medical management by a physician with specialized training in physical medicine and rehabilitation for the following conditions: Daily direction of a multidisciplinary physical rehabilitation program to ensure safe treatment while eliciting the highest outcome that is of practical value to the patient.: Yes Daily medical management of patient stability for increased activity during participation in an intensive  rehabilitation regime.: Yes Daily analysis of laboratory values and/or radiology reports with any subsequent need for medication adjustment of medical intervention for : Neurological problems;Post surgical problems;Other  Brock Ra 05/06/2011, 11:11 AM

## 2011-05-07 MED ORDER — NORTRIPTYLINE HCL 10 MG PO CAPS: 10.0000 mg | ORAL_CAPSULE | Freq: Every day | ORAL | Status: DC

## 2011-05-07 MED ORDER — TAMSULOSIN HCL 0.4 MG PO CAPS: 0.4000 mg | ORAL_CAPSULE | ORAL | Status: DC

## 2011-05-07 MED ORDER — POLYETHYLENE GLYCOL 3350 17 G PO PACK: 17.0000 g | PACK | Freq: Every day | ORAL | Status: AC

## 2011-05-07 MED ORDER — GABAPENTIN 300 MG PO CAPS: ORAL_CAPSULE | ORAL | Status: DC

## 2011-05-07 MED ORDER — FENTANYL 25 MCG/HR TD PT72: MEDICATED_PATCH | TRANSDERMAL | Status: DC

## 2011-05-07 MED ORDER — OXYCODONE HCL 10 MG PO TABS: ORAL_TABLET | ORAL | Status: DC

## 2011-05-07 MED ORDER — LORAZEPAM 0.5 MG PO TABS: 0.5000 mg | ORAL_TABLET | Freq: Every day | ORAL | Status: AC | PRN

## 2011-05-07 NOTE — Progress Notes (Signed)
Physical Therapy Discharge Summary  Patient Details  Name: SANFORD LINDBLAD MRN: [id number] Date of Birth: 1951-02-19 Today's Date: 05/07/2011  Patient has met 8 of 8 long term goals due to improved activity tolerance, improved balance and improved coordination. Patient to discharge at a wheelchair level Modified Independent and supervision ambulatory level with RW. Family education regarding car transfer with pt's daughter completed successfully.  Recommendation:  Patient will benefit from ongoing skilled PT services in home health setting to continue to advance safe functional mobility, address ongoing impairments in balance, gait, strength, activity tolerance, and minimize fall risk.  Equipment: Equipment provided: w/c 18x18, basic back and basic cushion, Left elevating legrest  Patient/family agrees with progress made and goals achieved: Yes  Tedd Sias 05/07/2011, 10:53 AM

## 2011-05-07 NOTE — Progress Notes (Signed)
Social Work Discharge Note Discharge Note  The overall goal for the admission was met for:   Discharge location: Yes - Home with wife and daughter to assist as needed  Length of Stay: Yes - 10 days  Discharge activity level: Yes - supervision to modified independent  Home/community participation: Yes - same  Services provided included: MD, RD, PT, OT, RN, CM, TR, Pharmacy and SW  Financial Services: Private Insurance: BCBS  Follow-up services arranged: Home Health: PT via [facility], DME: [phone] [place] w/c with ELR and cushion and hospital bed and Patient/Family has no preference for HH/DME agencies  Comments (or additional information):  Patient/Family verbalized understanding of follow-up arrangements: Yes  Individual responsible for coordination of the follow-up plan: patient  Confirmed correct DME delivered: Jermall Isaacson 05/07/2011    Eulogia Dismore

## 2011-05-07 NOTE — Progress Notes (Signed)
Patient ID: Norman Pearson, male   DOB: 05-01-1951, 60 y.o.   MRN: [id number] Patient ID: Norman Pearson, male   DOB: [birth year], 60 y.o.   MRN: [id number] Subjective/Complaints: ,Review of Systems  Musculoskeletal: Positive for back pain.  Neurological: Positive for tremors and sensory change.   a little sore, but pain overall better.  A little numb over left anterolateral thigh.  Objective: Vital Signs: Blood pressure 115/72, pulse 98, temperature 99 F (37.2 C), temperature source Oral, resp. rate 20, height 6' (1.829 m), weight 76.386 kg (168 lb 6.4 oz), SpO2 97.00%. No results found. No results found for this basename: WBC:2,HGB:2,HCT:2,PLT:2 in the last 72 hours No results found for this basename: NA:2,K:2,CL:2,CO2:2,GLUCOSE:2,BUN:2,CREATININE:2,CALCIUM:2 in the last 72 hours CBG (last 3)  No results found for this basename: GLUCAP:3 in the last 72 hours  Wt Readings from Last 3 Encounters:  05/06/11 76.386 kg (168 lb 6.4 oz)  04/17/11 73.483 kg (162 lb)  04/17/11 73.483 kg (162 lb)    Physical Exam:  General appearance: alert Head: Normocephalic, without obvious abnormality, atraumatic Eyes: conjunctivae/corneas clear. PERRL, EOM's intact. Fundi benign. Ears: normal TM's and external ear canals both ears Nose: Nares normal. Septum midline. Mucosa normal. No drainage or sinus tenderness. Throat: lips, mucosa, and tongue normal; teeth and gums normal Neck: no adenopathy, no carotid bruit, no JVD, supple, symmetrical, trachea midline and thyroid not enlarged, symmetric, no tenderness/mass/nodules Back: symmetric, no curvature. ROM normal. No CVA tenderness. Resp: clear to auscultation bilaterally Cardio: regular rate and rhythm, S1, S2 normal, no murmur, click, rub or gallop GI: soft, non-tender; bowel sounds normal; no masses,  no organomegaly Extremities: no edema, redness or tenderness in the calves or thighs and no ulcers, gangrene or trophic changes Pulses: 2+ and  symmetric Skin: Skin color, texture, turgor normal. No rashes or lesions Neurologic: Grossly normal except for right leg with pain inhibition weakness as well as sensory loss and dysesthesias along antero-medial thigh. Strength in rle grossly 4/5 except for hip flexors which are 3-3+/5 Left foot nvi. Incision/Wound: back wound clean and intact.  Left leg in splint and intact.  Left hand wound clean.  TLSO upper right screw repaired   Assessment/Plan: 1. Functional deficits secondary to left distal tibial/proximal fibular fxs, L1 burst fx s/p decompression and fusion which require 3+ hours per day of interdisciplinary therapy in a comprehensive inpatient rehab setting. Physiatrist is providing close team supervision and 24 hour management of active medical problems listed below. Physiatrist and rehab team continue to assess barriers to discharge/monitor patient progress toward functional and medical goals.  Finalizing d/c planning for tomorrow. Mobility: Bed Mobility Rolling Right: 4: Min assist Rolling Left: 6: Modified independent (Device/Increase time);With rail Right Sidelying to Sit: 4: Min assist Sitting - Scoot to Edge of Bed: 5: Supervision Sit to Supine - Right: 2: Max assist Sit to Supine - Right Details (indicate cue type and reason): lift B LEs into bed Transfers Transfers: Yes Sit to Stand: 4: Min assist Sit to Stand Details (indicate cue type and reason): c/o dizziness, min assist to steady balance Stand to Sit: 4: Min assist Stand Pivot Transfers: 3: Mod assist Stand Pivot Transfer Details (indicate cue type and reason): lifting assist to stand, steadying assist for turning with RW Squat Pivot Transfers: 5: Supervision Squat Pivot Transfer Details (indicate cue type and reason): cues for technique Ambulation/Gait Ambulation/Gait Assistance: 5: Supervision Ambulation/Gait Assistance Details (indicate cue type and reason): unable to assess due to orthostatic  hypotension Ambulation  Distance (Feet): 40 Feet Assistive device: Photographer Mobility: Yes Wheelchair Assistance: 4: Min Education officer, museum: Both upper extremities Wheelchair Parts Management: Needs assistance Distance: 150 feet ADL:   Cognition: Cognition Overall Cognitive Status: Appears within functional limits for tasks assessed Arousal/Alertness: Awake/alert Orientation Level: Oriented X4 Cognition Arousal/Alertness: Awake/alert Orientation Level: Oriented X4   Medical Problem List and Plan:  1. DVT Prophylaxis/Anticoagulation: Mechanical: Antiembolism stockings, thigh (TED hose) Bilateral lower extremities  Sequential compression devices, below knee Bilateral lower extremities. lovenox can be d/'ced  2. Pain Management: right thigh pain is neuropathic.   oxy ir in 3 hour intervals.  Will try for now. Increase neurontin to 300mg  TID. Pain control improving overall.  pamelor qhs for radicular pain also on board.  - fentanyl -- increase to  -reassured pt that what he's experiencing is expected  -anxiety factor.  Overall much improved.  I'll see him in my clinic in a month.  3. Mood: Motivated to get better.  Discussed being realistic about activities.    4. Unstable burst fracture of first lumbar vertebra (04/14/2011)  Assessment: Decompressive lam with arthrodesis for stabilization on 11/20 Plan: TLSO if HOB greater than 30 degrees. Routine back precautions. CIR level therapies as above.  -TLSO is a little long.  Adjust positioning as needed.  5. Closed fracture of left fibula (04/14/2011)  Assessment: ORIF for repair tibial pilon fx 11/29. Plan: Continue NWB LLE.  Fiberglass cast applied.   6. Laceration of fourth finger of left hand (04/14/2011)  Assessment: repaired and healing nicely.  7. Anemia associated with acute blood loss (04/15/2011)  Assessment: Continue to monitor hgb for now. Add MVI for supplement due  to constipation issues Plan: Iron:  H/H trending up  8. Hypocalcemia (04/15/2011)  Plan: Level improved today. Will as supplement as indicated.  9. Recurrent urinary retention (04/27/2011)  Assessment: Due to neurogenic bladder with recurrent surgery, narcotic, immobility and CONSTIPATION.   -Enterococcus UTI treated  -Voiding fairly well now.  Discussed narcotic effects on bladder.  10. Constipation: Scheduled miralax.  Bowels moving now  11. Dizziness: improved.  Pushing fluids.    LOS (Days) 10  Jamael Hoffmann T 05/07/2011, 6:49 AM     A FACE TO FACE EXAM WAS PERFORMED

## 2011-05-08 NOTE — Progress Notes (Signed)
Discharge summary [id number]

## 2011-05-08 NOTE — Discharge Summary (Signed)
NAME:  Norman Pearson, Norman Pearson NO.:  [id number]  MEDICAL RECORD NO.:  [id number]  LOCATION:  4147                         FACILITY:  [facility]  PHYSICIAN:  Ranelle Oyster, M.D.DATE OF BIRTH:  [birth year]  DATE OF ADMISSION:  04/27/2011 DATE OF DISCHARGE:  05/07/2011                              DISCHARGE SUMMARY   DISCHARGE DIAGNOSES: 1. L1 burst fracture with retropulsion, required decompression and     arthrodesis. 2. Left distal tibia metaphysis fracture with mild displacement of the     proximal fibula fracture, treated with open reduction and internal     fixation. 3. Urinary retention, resolved. 4. Neuropathy, right lower extremity.  HISTORY OF PRESENT ILLNESS:  Norman Pearson is a 60 year old male who fell 12 feet off a ladder on April 14, 2011, with complaints of left ankle and low back pain.  X-rays done revealed highly comminuted left distal fibular metaphysis fracture with intraarticular extension, mildly displaced proximal fibular fracture, central herniated nucleus pulposus C3-C4, and L1 burst fracture with 50% retropulsion into the canal.  The patient was taken to OR for I and D of left hand laceration as well as closed reduction and placement of external fixator on left lower extremity by Dr. Victorino Dike.  He was evaluated by Dr. Danielle Dess and underwent posterior decompression of L1 burst fracture with posterior fixation and arthrodesis at T12-L2 on the same day.  Postop, he is nonweightbearing on left lower extremity and is to have TLSO whenever head of bed greater than 30 degrees.  The patient underwent ORIF of left tibial fracture with removal of external fixator on May 23, 2011, and is to continue nonweightbearing on left lower extremity.  The patient has had issues with recurrent postop urinary retention with bladder spasms and Foley was replaced.  He also continues to have complaints of constipation since surgery.  Attempts were made to  remove Foley.  However, bladder scan revealed volume at 1100 mL, therefore Foley was replaced.  Currently, therapies are ongoing and the patient is noted to have complaints of dizziness with mobility.  He was evaluated by Rehab and we felt that he would benefit from a CIR program.  REVIEW OF SYSTEMS:  Positive for ear pain positive.  Positive for constipation with some blood in stool and urinary retention with penile discomfort.  Sensory changes in right lower extremity.  Also noted to have issues with anxiety and nervousness.  PAST MEDICAL HISTORY:  Negative.  PAST SURGICAL HISTORY:  Negative except for recent surgeries.  FAMILY HISTORY:  Mother with lung cancer and father with history of COPD.  SOCIAL HISTORY:  The patient is married and works as a Chiropractor. Wife has MS and can provide limited assist.  Daughter lives next to and can check in past discharge.  He reports that he quit smoking 37 years ago, does not use any smokeless tobacco.  He drinks a glass of wine or liquor 4-5 times a week.  Does not use any illicit drugs.  ALLERGIES:  No known drug allergies.  FUNCTIONAL HISTORY:  The patient was independent prior to admission.  PHYSICAL EXAMINATION:  GENERAL:  The patient is a well-nourished, well- developed male, in no  acute distress. HEENT:  Atraumatic normocephalic.  Hearing intact.  Nares patent.  Oral mucosa is clear and moist.  Normal dentition.  Pupils are equal, round, and reactive to light. NECK:  Normal range of motion. LUNGS:  Clear to auscultation bilaterally.  Normal respiratory effort. CARDIOVASCULAR:  Normal rate rhythm. ABDOMEN:  Soft.  He exhibits no distention, but mild tenderness with palpation. NEUROLOGIC:  The patient is alert and oriented x3.  He is noted to have numbness right toes as well as tingling in left toes. SKIN:  Back incision is clean, dry, and intact. PSYCHIATRIC:  The patient with normal mood and affect.  Judgment  normal.  HOSPITAL COURSE:  Norman Pearson was admitted to Rehab on April 27, 2011, for inpatient therapies to consist of PT/OT at least 3 hours 5 days a week.  Past admission, physiatrist, rehab, RN, and therapy team have worked together to provide customized collaborative interdisciplinary care.  Rehab RN has worked with the patient on bowel and bladder program as well as assisted and monitoring of wound and pain management.  The patient's back incision was monitored along and this is noted to be healing well without any signs or symptoms of infection. Blood pressures have been checked on a b.i.d. basis and have been stable.  Labs done past admission revealed sodium 138, potassium 4.1, chloride 103, CO2 of 27, BUN 16, creatinine 1.0, glucose 104.  He was noted to have low protein stores with total protein at 5.6 and albumin at 2.2.  Protein supplements were added to help with wound healing and his protein stores, however, the patient with dislike of this and he has used nutritional supplements instead.  He was noted to have acute blood loss anemia with H and H at 10.0 and 29.9.  Repeat of May 04, 2011, shows the stability with hemoglobin at 10.2 and hematocrit at 31.1.  The patient's Foley was kept in place initially until mobility improved. UA/UC was done at time of admission showing greater than 100,000 colonies of enterococcus in his urine.  He was started on Cipro for treatment.  Issues with constipation was treated with aggressive bowel program.  He was treated with soapsuds enema as well as MiraLAX and Dulcolax tabs  initially to help with his symptoms.  The patient has had issues with pain management.  He declined use of long- acting pain medicines initially.  As he continues to have issues with increased pain with increased activity, he was started on fentanyl patch which was slowly titrated to 25 mcg an hour.  Currently, he continues on this with OxyIR used 2-3 times  a day.  He was started on Neurontin additionally to help with his neuropathic symptoms.  Once Foley discontinued, the patient was toileted q.4 hours and PVRs were checked with bladder scan.  The patient was noted to be voiding without difficulty.  During the patient's stay in Rehab, weekly team conferences were held to monitor the patient's progress, set goals as well as discuss barriers to discharge.  At admission, the patient was noted to be limited by dizziness with positional change with some hypotension.  He was able to perform with stand pivot and transfers with min assist, squat pivot transfers with supervision despite dizziness.  The patient was noted to be limited by decreased activity tolerance as well as dizziness requiring frequent rest breaks.  His dizziness symptoms did gradually improve.  He has been compliant with his nonweightbearing status on left lower extremity as well as his  back precautions.  OT has worked with the patient on self-care tasks as well as focused on upper extremity strengthening to help increase independence with transfers and overall activity tolerance.  The patient has had some complaints of soreness in quads with tight hamstrings and he has been educated regarding hamstring stretch and short arc quad for lower extremity strengthening. Currently, the patient has progressed to being at independent level for bed mobility.  He is at supervision for upper body bathing and dressing at bed level.  He is able to sit at sink and wheelchair for lower body bathing and dressing using Adaptic equipment, appropriately.  He is modified independent for toilet transfers and toileting.  The patient has shown no unsafe behaviors and is currently able to direct caregivers with donning and doffing of his TLSO.  Currently, the patient is modified independent for transfers, modified independent for ambulating 160 feet with supervision.  He requires supervision for car  transfers. Daughter was educated regarding supervision at this time needed for car transfers.  Of note, the patient's splint on left lower extremity was changed out by Dr. Victorino Dike on May 05, 2011.  Sutures are still in place anteriorly.  A cast was applied and the patient was instructed to follow up with Dr. Victorino Dike in 2-3 weeks.  He is also instructed to keep LLE elevated as much as possible.  DISCHARGE MEDICATIONS: 1. Fentanyl 25 mcg an hour, change q.3 days, 2 boxes Rx. 2. Neurontin 300 mg p.o. t.i.d. 3. Flomax 0.4 mg p.o. nightly. 4. MiraLAX 17 g in 8 ounces p.o. per day. 5. OxyIR 10 mg 1/2-1 p.o. q.6 hours p.r.n. moderate to severe pain,     #90 Rx. 6. Nortriptyline 10 mg, #30, 1 p.o. nightly. 7. Lorazepam 0.5 mg 1 p.o. daily p.r.n. anxiety, #15 Rx. 8. Aspirin 325 mg p.o. per day. 9. Milk thistle 1 p.o. per day.  DIET:  Regular.  ACTIVITY LEVEL:  At intermittent supervision, bath and dress with assistance.  SPECIAL INSTRUCTIONS:  No alcohol.  [facility] to provide PT. Don brace while supine in bed.  No weight on left lower extremity.  No bending, twisting, and arching.  No driving.  FOLLOWUP:  The patient to follow up with Dr. Felipa Eth in 2 weeks for postop check.  Follow up with Dr. Victorino Dike for postop check in 2 weeks.  Follow up with Dr. Danielle Dess for postop check in 2-3 weeks.  Follow up with Dr. Riley Kill on May 25, 2011, at 11:15 for 11:45 appointment.     Delle Reining, P.A.   ______________________________ Ranelle Oyster, M.D.    PL/MEDQ  D:  05/08/2011  T:  05/08/2011  Job:  [id number]  cc:   Stefani Dama, M.D. [name], MD Larina Earthly, M.D.

## 2011-05-25 ENCOUNTER — Encounter
Payer: BC Managed Care – PPO | Attending: Physical Medicine & Rehabilitation | Admitting: Physical Medicine & Rehabilitation

## 2011-05-25 DIAGNOSIS — S8290XS Unspecified fracture of unspecified lower leg, sequela: Secondary | ICD-10-CM | POA: Insufficient documentation

## 2011-05-25 DIAGNOSIS — S32009A Unspecified fracture of unspecified lumbar vertebra, initial encounter for closed fracture: Secondary | ICD-10-CM

## 2011-05-25 DIAGNOSIS — X58XXXS Exposure to other specified factors, sequela: Secondary | ICD-10-CM | POA: Insufficient documentation

## 2011-05-25 DIAGNOSIS — IMO0002 Reserved for concepts with insufficient information to code with codable children: Secondary | ICD-10-CM

## 2011-05-25 DIAGNOSIS — S82899A Other fracture of unspecified lower leg, initial encounter for closed fracture: Secondary | ICD-10-CM

## 2011-05-25 DIAGNOSIS — Z981 Arthrodesis status: Secondary | ICD-10-CM | POA: Insufficient documentation

## 2011-05-25 NOTE — Assessment & Plan Note (Signed)
Norman Pearson is back regarding his L1 burst fracture as well as left distal tibia fracture and proximal fibular fracture.  He has suffered from radiculopathy as well in the right leg involving the L1 and L2 levels most prominently.  He has been receiving therapy, he is making progress with his strength and gait.  He still has some weakness with hip flexion, lesser so with knee extension.  His pain is generally improving, but is having pins and needles still in the anterior thigh and down across some of the medial knee.  He is off the fentanyl patch, I did not agree with them.  He uses oxycodone 10 mg usually 2 or 3 a day.  He is on the nortriptyline 10 mg at nighttime and gabapentin now 300 mg 5 times a day per Dr. Verlee Rossetti instruction.  The patient rates his pain overall 3/10, describes as dull.  Sleep is fair.  He is sleeping now from 7 p.m. to 8 a.m. the following morning. He does usually wake up around 2 and is up for a bit.  REVIEW OF SYSTEMS:  Notable for the above.  His bowel and bladder function is appropriate.  Full 12-point review is in the written health and history section of the chart.  SOCIAL HISTORY:  Unchanged.  He is married living with his wife.  He is a Surveyor, minerals.  PHYSICAL EXAMINATION:  VITAL SIGNS:  Blood pressure is 127/69, pulse 117, respiratory rate 16, he is satting 96% on room air. GENERAL:  Patient is pleasant, alert. MUSCULOSKELETAL:  Upper extremity strength is 5/5.  He has good posture. He is wearing his brace today.  Cognitively, he is alert and appropriate.  His mood is pleasant.  He has ongoing hip flexor weakness and graded his hip flexion at about 2+ to nearly 3- today.  Hip abduction is 3 to 4 out of 5.  Knee extension is 4/5, ankle dorsiflexion, plantar flexion is 5/5 on the right side.  It has diminished sensation particularly of the L2 dermatome on the right. Left lower extremity is grossly intact proximally.  He did have some speckling on the  toes which appear to be some small micro hemorrhages or bruises.  He did have good temperature and color otherwise.  He had normal sensation and movement of the toes.  The patient walked for me today and is able to use the right leg to advance himself.  He had no major issues with hip flexion and standing.  He is able to maintain his nonweightbearing status on the left foot.  ASSESSMENT: 1. L1 burst fracture status post decompression and fusion.  The     patient with a L1-L2 radiculopathy. 2. Left distal tibia metaphysis fracture and proximal fibular     fracture, status post open reduction and internal fixation.  PLAN: 1. Counseled patient on pain control.  I think he would do better with     Neurontin schedule 4 times a day with 600 mg dose at bedtime.  I     would also like to increase his nortriptyline to help him at night     for sleep and with his neuropathic pain symptoms.  Initially, we     will just increase the Pamelor to 20 mg.  If he tolerates this, he     will fill the 25 mg prescription I wrote today.  If he tolerates     the Pamelor, but still needs further pain relief, I would recommend  changing to 4 times a day schedule with the Neurontin, but     increasing the nighttime/bedtime dose to 600 mg. 2. Oxycodone #75 10 mg to be used 2-3 times a day p.r.n. for     breakthrough pain.  He has generally used only at nighttime. 3. Discussed more traditional sleep schedule i.e. trying to go to bed     a bit later for continuous 8 hours or so sleep as opposed to broken     10-11 hours that he is doing currently. 4. Regarding his left foot, this may be some mild bruising or perhaps     some mild microemboli to the foot related to his fracture.     Certainly, it does not appear to be emergency at this point as the     foot appeared neurovascularly intact to me on examination today.  I     did recommend that he discuss it with Dr. Victorino Dike, when he sees him     in the office  this week. 5. Continue physical therapy, with plan on continued strengthening of     the right lower extremity.  I will advance     new phase of therapy once he is given permission to bear weight on     the left foot. 6. I will see the patient back here in about a month.  He is to call     me with any questions or problems.     Ranelle Oyster, M.D. Electronically Signed    ZTS/MedQ D:  05/25/2011 13:22:07  T:  05/25/2011 20:26:16  Job #:  [id number]  cc:   Stefani Dama, M.D. Fax: [phone]  [name], MD Fax: [phone]

## 2011-06-22 ENCOUNTER — Ambulatory Visit: Payer: BC Managed Care – PPO | Admitting: Physical Medicine & Rehabilitation

## 2011-07-07 ENCOUNTER — Encounter
Payer: BC Managed Care – PPO | Attending: Physical Medicine & Rehabilitation | Admitting: Physical Medicine & Rehabilitation

## 2011-07-07 DIAGNOSIS — X58XXXS Exposure to other specified factors, sequela: Secondary | ICD-10-CM | POA: Insufficient documentation

## 2011-07-07 DIAGNOSIS — S32009A Unspecified fracture of unspecified lumbar vertebra, initial encounter for closed fracture: Secondary | ICD-10-CM

## 2011-07-07 DIAGNOSIS — Z981 Arthrodesis status: Secondary | ICD-10-CM | POA: Insufficient documentation

## 2011-07-07 DIAGNOSIS — IMO0002 Reserved for concepts with insufficient information to code with codable children: Secondary | ICD-10-CM | POA: Insufficient documentation

## 2011-07-07 DIAGNOSIS — S8290XS Unspecified fracture of unspecified lower leg, sequela: Secondary | ICD-10-CM | POA: Insufficient documentation

## 2011-07-07 DIAGNOSIS — S82899A Other fracture of unspecified lower leg, initial encounter for closed fracture: Secondary | ICD-10-CM

## 2011-07-07 NOTE — Assessment & Plan Note (Signed)
Norman Pearson is back regarding his L1 burst fracture and radiculopathy at the L1-L2 levels predominantly.  He has been making progress.  Dr. Victorino Dike still follows him for his left leg and he is in a Cam boot and is putting 75 pounds weight on the foot currently.  He has come off all his medications except for 10 mg of nortriptyline at nighttime.  He is having no pain.  He still complains of some numbness in the right leg and occasionally in the left inguinal region.  REVIEW OF SYSTEMS:  Notable for the above.  Full 12-point review is in the written health and history section of the chart.  He does have some mild constipation and some urinary urgency, but nothing substantial.  SOCIAL HISTORY:  The patient is married and living with his wife.  He is a Surveyor, minerals.  PHYSICAL EXAMINATION:  VITAL SIGNS:  Blood pressure is 136/72, pulse 97, respiratory rate 18, and he is saturating 97% on room air. GENERAL:  The patient is pleasant, alert. MUSCULOSKELETAL:  Strength continues to improve.  Ankle dorsiflexion and plantar flexion grossly 5/5 on the right.  He is at least 3-4/5 on the left, but limited due to his boot placement.  Hip abduction and hip adduction are 4+/5.  Knee extension 4+/5.  Knee flexion 4+/5.  Hip flexion on the right is 3+/5.  Hip flexion on the left is 4+/5.  Sensory exam is diminished, still in the L1-L2 dermatomes predominantly. NEUROLOGIC:  Cognitively, he is intact.  Cranial nerve exam is normal.  ASSESSMENT: 1. L1 burst fracture, status post decompression and fusion with L1-L2     radiculopathy. 2. Left tibial metaphysis fracture and proximal fibular fracture,     status post open reduction and internal fixation.  PLAN: 1. The patient is doing extremely well at this point.  He can work on     further physical therapy once cleared from an orthopedic     standpoint.  He is doing well with his home exercises.  I have     nothing new to offer him.  He can wean completely off  the Pamelor     as tolerated at night. 2. I would assume he could return to his work duties at a certain     level over the next several months, orthopedic recovery permitting.     He would need to be careful from a standpoint of being in any     unsafe situations where he is exposed to heights, weights, etc.  I     think he is aware that he seems to have a pretty good head on his     shoulder.  I asked him to call me with any problems or questions in     the future.     Ranelle Oyster, M.D. Electronically Signed    ZTS/MedQ D:  07/07/2011 11:40:13  T:  07/07/2011 12:11:31  Job #:  [id number]  cc:   Stefani Dama, M.D. Fax: [phone]  [name], MD Fax: [phone]

## 2012-09-27 IMAGING — RF DG ANKLE 2V *L*
1 series · 5 of 5 positions shown · non-contrast
Comparison: 04/15/2011

CLINICAL DATA: Fracture

LEFT ANKLE - 2 VIEW

[Series 1: run · 5 of 5 slices shown]
[im 1/5]
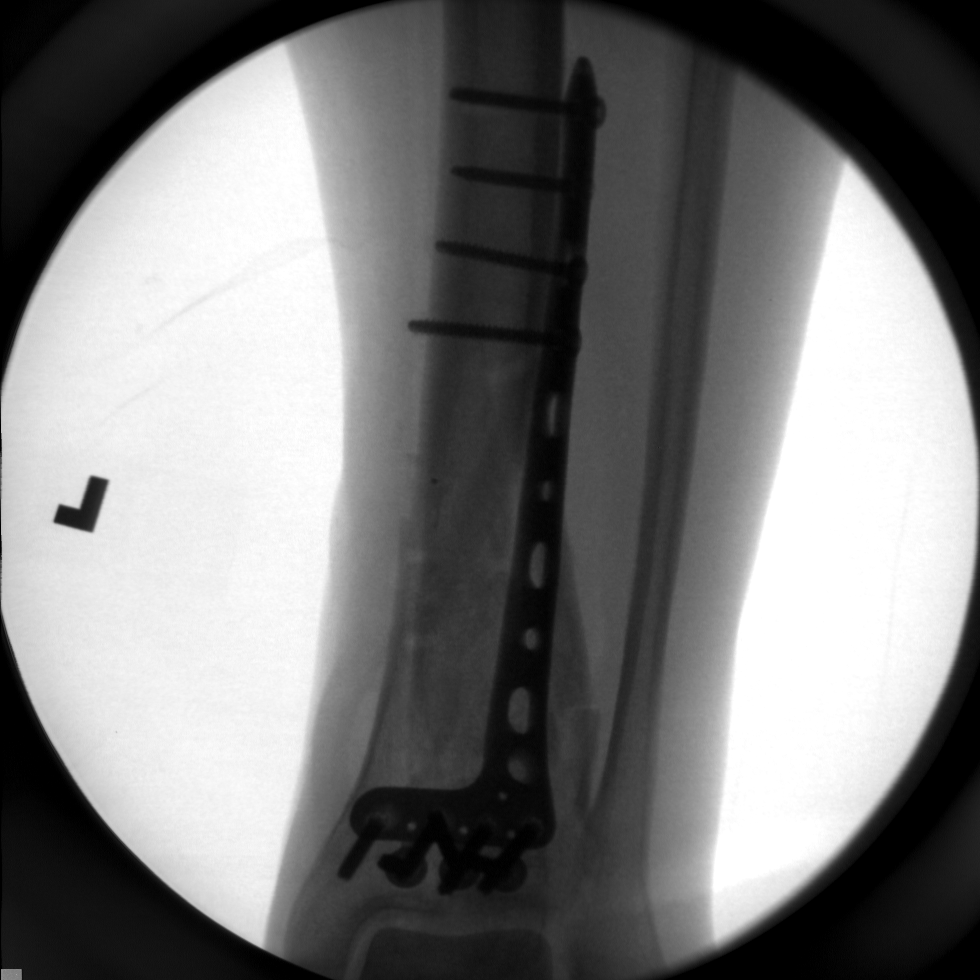
[im 2/5]
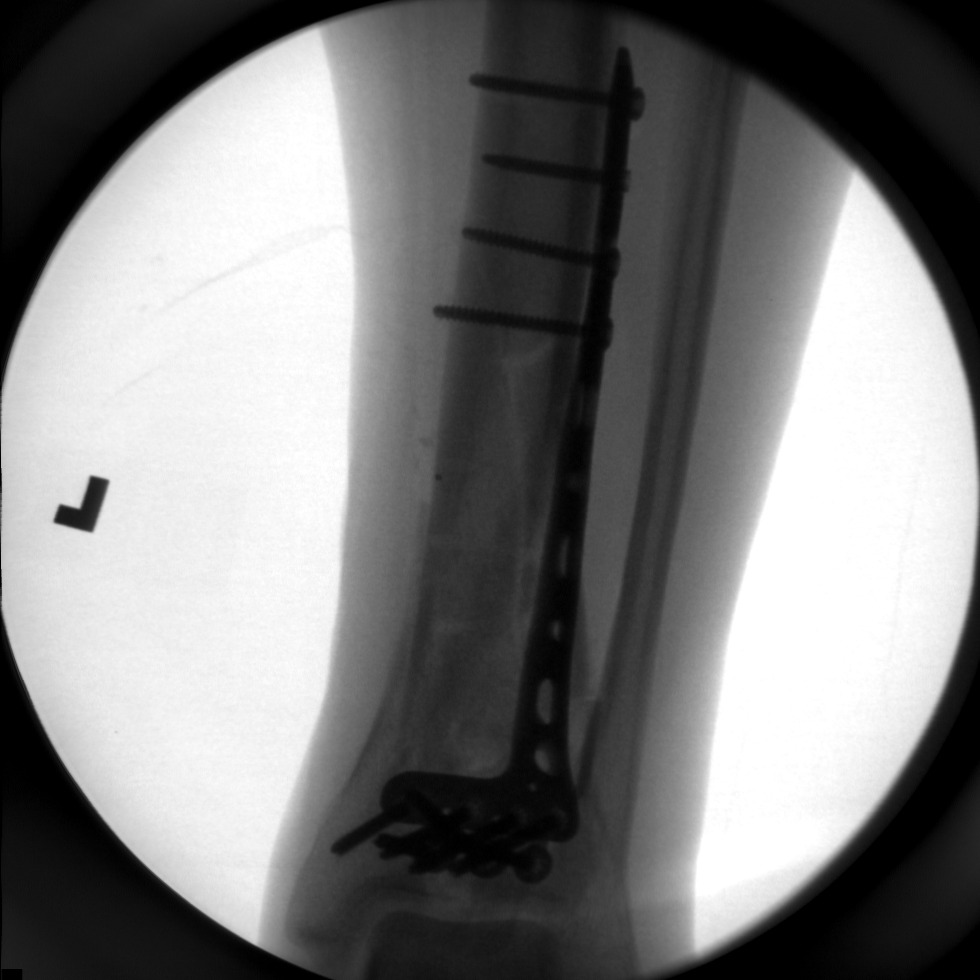
[im 3/5]
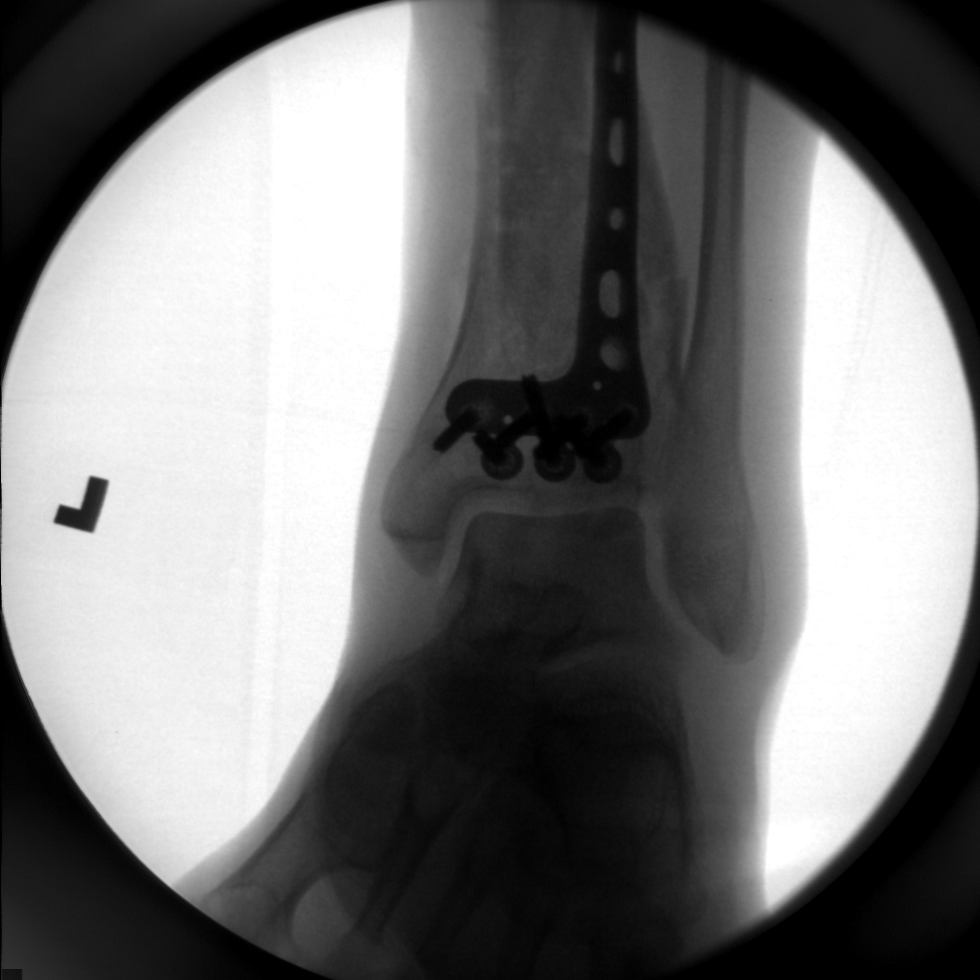
[im 4/5]
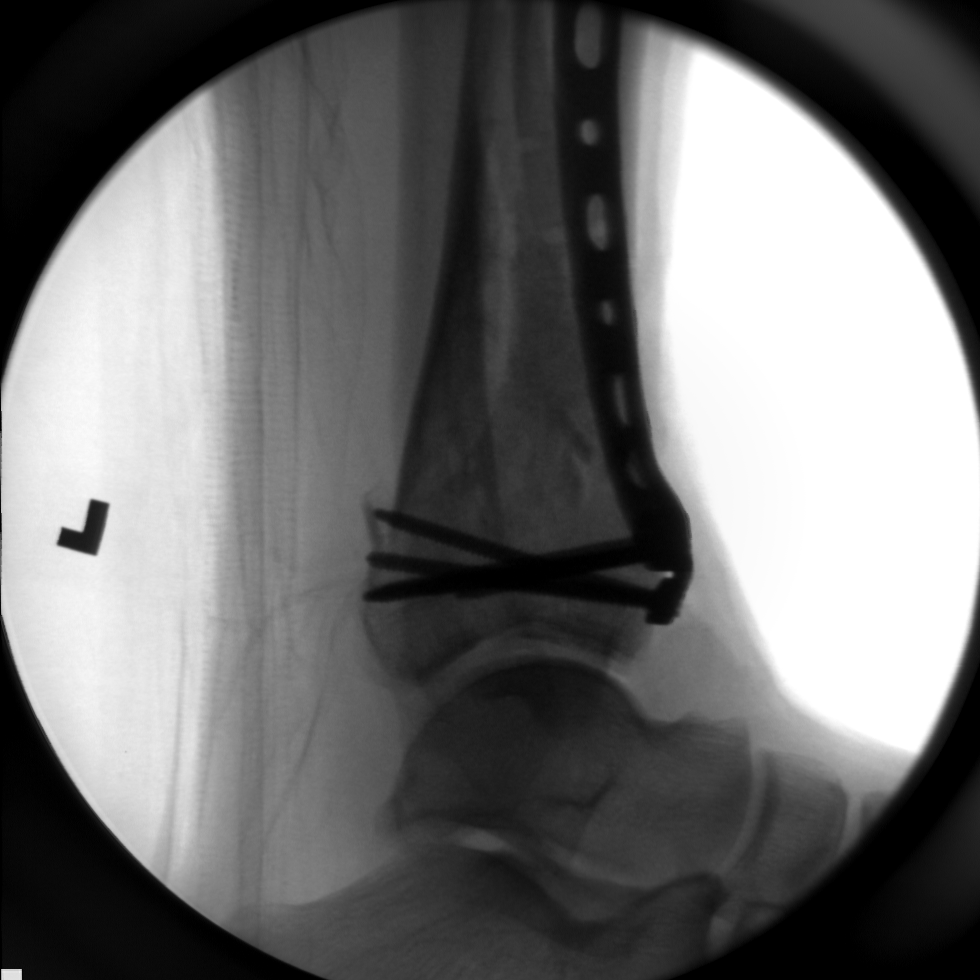
[im 5/5]
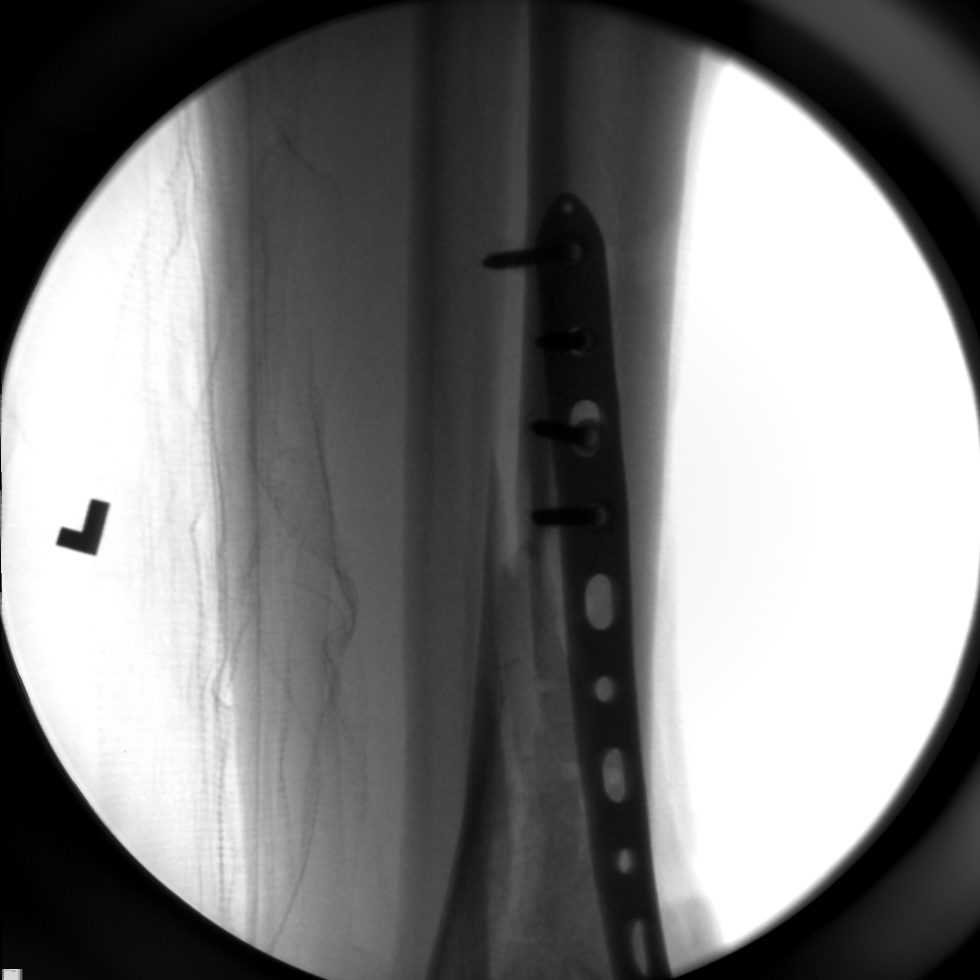

[5 of 5 positions shown; findings below may reference images not displayed]

FINDINGS: Multiple intraoperative spot images demonstrate placement
of a plate and screws transfixing a markedly comminuted distal
tibia fracture.  There is gross alignment of the fracture
fragments.  No breakage or loosening of the hardware.
IMPRESSION: ORIF tibial fracture.

## 2014-05-25 HISTORY — PX: COLONOSCOPY: SHX174

## 2014-10-29 DIAGNOSIS — S32009A Unspecified fracture of unspecified lumbar vertebra, initial encounter for closed fracture: Secondary | ICD-10-CM | POA: Diagnosis not present

## 2014-10-29 DIAGNOSIS — Z6822 Body mass index (BMI) 22.0-22.9, adult: Secondary | ICD-10-CM | POA: Diagnosis not present

## 2014-10-29 DIAGNOSIS — M5416 Radiculopathy, lumbar region: Secondary | ICD-10-CM | POA: Diagnosis not present

## 2014-11-22 DIAGNOSIS — M549 Dorsalgia, unspecified: Secondary | ICD-10-CM | POA: Diagnosis not present

## 2014-11-22 DIAGNOSIS — M545 Low back pain: Secondary | ICD-10-CM | POA: Diagnosis not present

## 2014-12-05 DIAGNOSIS — Z1211 Encounter for screening for malignant neoplasm of colon: Secondary | ICD-10-CM | POA: Diagnosis not present

## 2014-12-26 DIAGNOSIS — Z1283 Encounter for screening for malignant neoplasm of skin: Secondary | ICD-10-CM | POA: Diagnosis not present

## 2014-12-26 DIAGNOSIS — X32XXXD Exposure to sunlight, subsequent encounter: Secondary | ICD-10-CM | POA: Diagnosis not present

## 2014-12-26 DIAGNOSIS — L723 Sebaceous cyst: Secondary | ICD-10-CM | POA: Diagnosis not present

## 2014-12-26 DIAGNOSIS — L570 Actinic keratosis: Secondary | ICD-10-CM | POA: Diagnosis not present

## 2015-01-17 DIAGNOSIS — H6121 Impacted cerumen, right ear: Secondary | ICD-10-CM | POA: Diagnosis not present

## 2015-01-17 DIAGNOSIS — Z87891 Personal history of nicotine dependence: Secondary | ICD-10-CM | POA: Diagnosis not present

## 2015-01-17 DIAGNOSIS — H9313 Tinnitus, bilateral: Secondary | ICD-10-CM | POA: Diagnosis not present

## 2015-05-07 DIAGNOSIS — K219 Gastro-esophageal reflux disease without esophagitis: Secondary | ICD-10-CM | POA: Diagnosis not present

## 2015-05-07 DIAGNOSIS — Z125 Encounter for screening for malignant neoplasm of prostate: Secondary | ICD-10-CM | POA: Diagnosis not present

## 2015-05-14 DIAGNOSIS — G629 Polyneuropathy, unspecified: Secondary | ICD-10-CM | POA: Diagnosis not present

## 2015-05-14 DIAGNOSIS — L989 Disorder of the skin and subcutaneous tissue, unspecified: Secondary | ICD-10-CM | POA: Diagnosis not present

## 2015-05-14 DIAGNOSIS — M25561 Pain in right knee: Secondary | ICD-10-CM | POA: Diagnosis not present

## 2015-05-14 DIAGNOSIS — M5416 Radiculopathy, lumbar region: Secondary | ICD-10-CM | POA: Diagnosis not present

## 2015-05-14 DIAGNOSIS — K219 Gastro-esophageal reflux disease without esophagitis: Secondary | ICD-10-CM | POA: Diagnosis not present

## 2015-05-14 DIAGNOSIS — Z0000 Encounter for general adult medical examination without abnormal findings: Secondary | ICD-10-CM | POA: Diagnosis not present

## 2015-05-14 DIAGNOSIS — J302 Other seasonal allergic rhinitis: Secondary | ICD-10-CM | POA: Diagnosis not present

## 2015-05-14 DIAGNOSIS — N400 Enlarged prostate without lower urinary tract symptoms: Secondary | ICD-10-CM | POA: Diagnosis not present

## 2015-06-20 DIAGNOSIS — N400 Enlarged prostate without lower urinary tract symptoms: Secondary | ICD-10-CM | POA: Diagnosis not present

## 2015-06-20 DIAGNOSIS — R300 Dysuria: Secondary | ICD-10-CM | POA: Diagnosis not present

## 2015-06-20 DIAGNOSIS — Z6822 Body mass index (BMI) 22.0-22.9, adult: Secondary | ICD-10-CM | POA: Diagnosis not present

## 2015-06-21 DIAGNOSIS — M1711 Unilateral primary osteoarthritis, right knee: Secondary | ICD-10-CM | POA: Diagnosis not present

## 2015-08-09 DIAGNOSIS — N400 Enlarged prostate without lower urinary tract symptoms: Secondary | ICD-10-CM | POA: Diagnosis not present

## 2015-08-09 DIAGNOSIS — N411 Chronic prostatitis: Secondary | ICD-10-CM | POA: Diagnosis not present

## 2015-08-09 DIAGNOSIS — Z0000 Encounter for general adult medical examination without abnormal findings: Secondary | ICD-10-CM | POA: Diagnosis not present

## 2015-08-09 DIAGNOSIS — N5201 Erectile dysfunction due to arterial insufficiency: Secondary | ICD-10-CM | POA: Diagnosis not present

## 2016-04-14 DIAGNOSIS — X32XXXD Exposure to sunlight, subsequent encounter: Secondary | ICD-10-CM | POA: Diagnosis not present

## 2016-04-14 DIAGNOSIS — L570 Actinic keratosis: Secondary | ICD-10-CM | POA: Diagnosis not present

## 2016-04-14 DIAGNOSIS — D225 Melanocytic nevi of trunk: Secondary | ICD-10-CM | POA: Diagnosis not present

## 2016-04-14 DIAGNOSIS — Z1283 Encounter for screening for malignant neoplasm of skin: Secondary | ICD-10-CM | POA: Diagnosis not present

## 2016-06-02 DIAGNOSIS — Z125 Encounter for screening for malignant neoplasm of prostate: Secondary | ICD-10-CM | POA: Diagnosis not present

## 2016-06-02 DIAGNOSIS — Z0000 Encounter for general adult medical examination without abnormal findings: Secondary | ICD-10-CM | POA: Diagnosis not present

## 2016-06-08 DIAGNOSIS — J302 Other seasonal allergic rhinitis: Secondary | ICD-10-CM | POA: Diagnosis not present

## 2016-06-08 DIAGNOSIS — L989 Disorder of the skin and subcutaneous tissue, unspecified: Secondary | ICD-10-CM | POA: Diagnosis not present

## 2016-06-08 DIAGNOSIS — G629 Polyneuropathy, unspecified: Secondary | ICD-10-CM | POA: Diagnosis not present

## 2016-06-08 DIAGNOSIS — R05 Cough: Secondary | ICD-10-CM | POA: Diagnosis not present

## 2016-06-08 DIAGNOSIS — K219 Gastro-esophageal reflux disease without esophagitis: Secondary | ICD-10-CM | POA: Diagnosis not present

## 2016-06-08 DIAGNOSIS — N400 Enlarged prostate without lower urinary tract symptoms: Secondary | ICD-10-CM | POA: Diagnosis not present

## 2016-06-08 DIAGNOSIS — N411 Chronic prostatitis: Secondary | ICD-10-CM | POA: Diagnosis not present

## 2016-06-08 DIAGNOSIS — Z0000 Encounter for general adult medical examination without abnormal findings: Secondary | ICD-10-CM | POA: Diagnosis not present

## 2016-06-08 DIAGNOSIS — M25561 Pain in right knee: Secondary | ICD-10-CM | POA: Diagnosis not present

## 2016-06-11 DIAGNOSIS — Z1212 Encounter for screening for malignant neoplasm of rectum: Secondary | ICD-10-CM | POA: Diagnosis not present

## 2016-06-24 DIAGNOSIS — H02052 Trichiasis without entropian right lower eyelid: Secondary | ICD-10-CM | POA: Diagnosis not present

## 2016-07-21 DIAGNOSIS — H40013 Open angle with borderline findings, low risk, bilateral: Secondary | ICD-10-CM | POA: Diagnosis not present

## 2016-07-21 DIAGNOSIS — H52203 Unspecified astigmatism, bilateral: Secondary | ICD-10-CM | POA: Diagnosis not present

## 2016-07-21 DIAGNOSIS — D3131 Benign neoplasm of right choroid: Secondary | ICD-10-CM | POA: Diagnosis not present

## 2016-07-30 DIAGNOSIS — D3131 Benign neoplasm of right choroid: Secondary | ICD-10-CM | POA: Diagnosis not present

## 2016-11-10 DIAGNOSIS — D3131 Benign neoplasm of right choroid: Secondary | ICD-10-CM | POA: Diagnosis not present

## 2016-11-17 DIAGNOSIS — N486 Induration penis plastica: Secondary | ICD-10-CM | POA: Diagnosis not present

## 2016-11-17 DIAGNOSIS — N401 Enlarged prostate with lower urinary tract symptoms: Secondary | ICD-10-CM | POA: Diagnosis not present

## 2016-11-17 DIAGNOSIS — R351 Nocturia: Secondary | ICD-10-CM | POA: Diagnosis not present

## 2016-12-23 DIAGNOSIS — D225 Melanocytic nevi of trunk: Secondary | ICD-10-CM | POA: Diagnosis not present

## 2017-01-05 DIAGNOSIS — H1031 Unspecified acute conjunctivitis, right eye: Secondary | ICD-10-CM | POA: Diagnosis not present

## 2017-01-05 DIAGNOSIS — H5711 Ocular pain, right eye: Secondary | ICD-10-CM | POA: Diagnosis not present

## 2017-05-11 DIAGNOSIS — H43811 Vitreous degeneration, right eye: Secondary | ICD-10-CM | POA: Diagnosis not present

## 2017-05-11 DIAGNOSIS — D3131 Benign neoplasm of right choroid: Secondary | ICD-10-CM | POA: Diagnosis not present

## 2017-06-10 DIAGNOSIS — R82998 Other abnormal findings in urine: Secondary | ICD-10-CM | POA: Diagnosis not present

## 2017-06-10 DIAGNOSIS — Z79899 Other long term (current) drug therapy: Secondary | ICD-10-CM | POA: Diagnosis not present

## 2017-06-10 DIAGNOSIS — Z125 Encounter for screening for malignant neoplasm of prostate: Secondary | ICD-10-CM | POA: Diagnosis not present

## 2017-06-17 DIAGNOSIS — Z0000 Encounter for general adult medical examination without abnormal findings: Secondary | ICD-10-CM | POA: Diagnosis not present

## 2017-06-17 DIAGNOSIS — G6289 Other specified polyneuropathies: Secondary | ICD-10-CM | POA: Diagnosis not present

## 2017-06-17 DIAGNOSIS — N400 Enlarged prostate without lower urinary tract symptoms: Secondary | ICD-10-CM | POA: Diagnosis not present

## 2017-06-17 DIAGNOSIS — Z6822 Body mass index (BMI) 22.0-22.9, adult: Secondary | ICD-10-CM | POA: Diagnosis not present

## 2017-06-17 DIAGNOSIS — J302 Other seasonal allergic rhinitis: Secondary | ICD-10-CM | POA: Diagnosis not present

## 2017-06-17 DIAGNOSIS — Z1389 Encounter for screening for other disorder: Secondary | ICD-10-CM | POA: Diagnosis not present

## 2017-07-26 DIAGNOSIS — D3131 Benign neoplasm of right choroid: Secondary | ICD-10-CM | POA: Diagnosis not present

## 2017-07-26 DIAGNOSIS — H52203 Unspecified astigmatism, bilateral: Secondary | ICD-10-CM | POA: Diagnosis not present

## 2017-07-26 DIAGNOSIS — H2513 Age-related nuclear cataract, bilateral: Secondary | ICD-10-CM | POA: Diagnosis not present

## 2017-08-05 DIAGNOSIS — L84 Corns and callosities: Secondary | ICD-10-CM | POA: Diagnosis not present

## 2017-08-05 DIAGNOSIS — M19072 Primary osteoarthritis, left ankle and foot: Secondary | ICD-10-CM | POA: Diagnosis not present

## 2017-08-05 DIAGNOSIS — Z6822 Body mass index (BMI) 22.0-22.9, adult: Secondary | ICD-10-CM | POA: Diagnosis not present

## 2017-09-01 DIAGNOSIS — S0501XA Injury of conjunctiva and corneal abrasion without foreign body, right eye, initial encounter: Secondary | ICD-10-CM | POA: Diagnosis not present

## 2017-09-17 DIAGNOSIS — M21612 Bunion of left foot: Secondary | ICD-10-CM | POA: Diagnosis not present

## 2017-09-17 DIAGNOSIS — M7742 Metatarsalgia, left foot: Secondary | ICD-10-CM | POA: Diagnosis not present

## 2017-09-17 DIAGNOSIS — L851 Acquired keratosis [keratoderma] palmaris et plantaris: Secondary | ICD-10-CM | POA: Diagnosis not present

## 2017-09-17 DIAGNOSIS — M79672 Pain in left foot: Secondary | ICD-10-CM | POA: Diagnosis not present

## 2017-11-09 DIAGNOSIS — H43811 Vitreous degeneration, right eye: Secondary | ICD-10-CM | POA: Diagnosis not present

## 2017-11-09 DIAGNOSIS — D3131 Benign neoplasm of right choroid: Secondary | ICD-10-CM | POA: Diagnosis not present

## 2018-03-02 DIAGNOSIS — L570 Actinic keratosis: Secondary | ICD-10-CM | POA: Diagnosis not present

## 2018-03-02 DIAGNOSIS — D225 Melanocytic nevi of trunk: Secondary | ICD-10-CM | POA: Diagnosis not present

## 2018-03-02 DIAGNOSIS — L820 Inflamed seborrheic keratosis: Secondary | ICD-10-CM | POA: Diagnosis not present

## 2018-03-02 DIAGNOSIS — X32XXXD Exposure to sunlight, subsequent encounter: Secondary | ICD-10-CM | POA: Diagnosis not present

## 2018-04-19 DIAGNOSIS — Z1212 Encounter for screening for malignant neoplasm of rectum: Secondary | ICD-10-CM | POA: Diagnosis not present

## 2018-06-27 DIAGNOSIS — Z79899 Other long term (current) drug therapy: Secondary | ICD-10-CM | POA: Diagnosis not present

## 2018-06-27 DIAGNOSIS — Z125 Encounter for screening for malignant neoplasm of prostate: Secondary | ICD-10-CM | POA: Diagnosis not present

## 2018-06-27 DIAGNOSIS — R82998 Other abnormal findings in urine: Secondary | ICD-10-CM | POA: Diagnosis not present

## 2018-07-04 DIAGNOSIS — G629 Polyneuropathy, unspecified: Secondary | ICD-10-CM | POA: Diagnosis not present

## 2018-07-04 DIAGNOSIS — R51 Headache: Secondary | ICD-10-CM | POA: Diagnosis not present

## 2018-07-04 DIAGNOSIS — Z1331 Encounter for screening for depression: Secondary | ICD-10-CM | POA: Diagnosis not present

## 2018-07-04 DIAGNOSIS — N400 Enlarged prostate without lower urinary tract symptoms: Secondary | ICD-10-CM | POA: Diagnosis not present

## 2018-07-04 DIAGNOSIS — Z6822 Body mass index (BMI) 22.0-22.9, adult: Secondary | ICD-10-CM | POA: Diagnosis not present

## 2018-07-04 DIAGNOSIS — Z0000 Encounter for general adult medical examination without abnormal findings: Secondary | ICD-10-CM | POA: Diagnosis not present

## 2018-07-06 DIAGNOSIS — Z1212 Encounter for screening for malignant neoplasm of rectum: Secondary | ICD-10-CM | POA: Diagnosis not present

## 2018-08-02 DIAGNOSIS — H52203 Unspecified astigmatism, bilateral: Secondary | ICD-10-CM | POA: Diagnosis not present

## 2018-08-02 DIAGNOSIS — D3131 Benign neoplasm of right choroid: Secondary | ICD-10-CM | POA: Diagnosis not present

## 2018-08-02 DIAGNOSIS — H2513 Age-related nuclear cataract, bilateral: Secondary | ICD-10-CM | POA: Diagnosis not present

## 2018-10-20 DIAGNOSIS — R351 Nocturia: Secondary | ICD-10-CM | POA: Diagnosis not present

## 2018-10-20 DIAGNOSIS — N401 Enlarged prostate with lower urinary tract symptoms: Secondary | ICD-10-CM | POA: Diagnosis not present

## 2018-11-08 DIAGNOSIS — M5136 Other intervertebral disc degeneration, lumbar region: Secondary | ICD-10-CM | POA: Diagnosis not present

## 2018-11-08 DIAGNOSIS — M9903 Segmental and somatic dysfunction of lumbar region: Secondary | ICD-10-CM | POA: Diagnosis not present

## 2018-11-08 DIAGNOSIS — M9905 Segmental and somatic dysfunction of pelvic region: Secondary | ICD-10-CM | POA: Diagnosis not present

## 2018-11-08 DIAGNOSIS — M9904 Segmental and somatic dysfunction of sacral region: Secondary | ICD-10-CM | POA: Diagnosis not present

## 2018-11-09 DIAGNOSIS — M5136 Other intervertebral disc degeneration, lumbar region: Secondary | ICD-10-CM | POA: Diagnosis not present

## 2018-11-09 DIAGNOSIS — M9904 Segmental and somatic dysfunction of sacral region: Secondary | ICD-10-CM | POA: Diagnosis not present

## 2018-11-09 DIAGNOSIS — M9903 Segmental and somatic dysfunction of lumbar region: Secondary | ICD-10-CM | POA: Diagnosis not present

## 2018-11-09 DIAGNOSIS — M9905 Segmental and somatic dysfunction of pelvic region: Secondary | ICD-10-CM | POA: Diagnosis not present

## 2018-11-10 DIAGNOSIS — M9905 Segmental and somatic dysfunction of pelvic region: Secondary | ICD-10-CM | POA: Diagnosis not present

## 2018-11-10 DIAGNOSIS — M9903 Segmental and somatic dysfunction of lumbar region: Secondary | ICD-10-CM | POA: Diagnosis not present

## 2018-11-10 DIAGNOSIS — M5136 Other intervertebral disc degeneration, lumbar region: Secondary | ICD-10-CM | POA: Diagnosis not present

## 2018-11-10 DIAGNOSIS — M9904 Segmental and somatic dysfunction of sacral region: Secondary | ICD-10-CM | POA: Diagnosis not present

## 2018-11-10 DIAGNOSIS — M545 Low back pain: Secondary | ICD-10-CM | POA: Diagnosis not present

## 2018-12-05 DIAGNOSIS — H25813 Combined forms of age-related cataract, bilateral: Secondary | ICD-10-CM | POA: Diagnosis not present

## 2018-12-05 DIAGNOSIS — H43813 Vitreous degeneration, bilateral: Secondary | ICD-10-CM | POA: Diagnosis not present

## 2018-12-05 DIAGNOSIS — D3131 Benign neoplasm of right choroid: Secondary | ICD-10-CM | POA: Diagnosis not present

## 2018-12-05 DIAGNOSIS — H353111 Nonexudative age-related macular degeneration, right eye, early dry stage: Secondary | ICD-10-CM | POA: Diagnosis not present

## 2018-12-05 DIAGNOSIS — H35361 Drusen (degenerative) of macula, right eye: Secondary | ICD-10-CM | POA: Diagnosis not present

## 2019-03-20 ENCOUNTER — Other Ambulatory Visit: Payer: Self-pay

## 2019-03-20 DIAGNOSIS — Z20822 Contact with and (suspected) exposure to covid-19: Secondary | ICD-10-CM

## 2019-03-21 LAB — NOVEL CORONAVIRUS, NAA: SARS-CoV-2, NAA: NOT DETECTED

## 2019-07-03 DIAGNOSIS — Z0000 Encounter for general adult medical examination without abnormal findings: Secondary | ICD-10-CM | POA: Diagnosis not present

## 2019-07-03 DIAGNOSIS — Z125 Encounter for screening for malignant neoplasm of prostate: Secondary | ICD-10-CM | POA: Diagnosis not present

## 2019-07-06 DIAGNOSIS — R82998 Other abnormal findings in urine: Secondary | ICD-10-CM | POA: Diagnosis not present

## 2019-07-10 DIAGNOSIS — Z1331 Encounter for screening for depression: Secondary | ICD-10-CM | POA: Diagnosis not present

## 2019-07-10 DIAGNOSIS — G4700 Insomnia, unspecified: Secondary | ICD-10-CM | POA: Diagnosis not present

## 2019-07-10 DIAGNOSIS — N400 Enlarged prostate without lower urinary tract symptoms: Secondary | ICD-10-CM | POA: Diagnosis not present

## 2019-07-10 DIAGNOSIS — Z0000 Encounter for general adult medical examination without abnormal findings: Secondary | ICD-10-CM | POA: Diagnosis not present

## 2019-07-10 DIAGNOSIS — M545 Low back pain: Secondary | ICD-10-CM | POA: Diagnosis not present

## 2019-07-10 DIAGNOSIS — G629 Polyneuropathy, unspecified: Secondary | ICD-10-CM | POA: Diagnosis not present

## 2019-07-10 DIAGNOSIS — M19079 Primary osteoarthritis, unspecified ankle and foot: Secondary | ICD-10-CM | POA: Diagnosis not present

## 2019-07-31 DIAGNOSIS — D225 Melanocytic nevi of trunk: Secondary | ICD-10-CM | POA: Diagnosis not present

## 2019-07-31 DIAGNOSIS — X32XXXD Exposure to sunlight, subsequent encounter: Secondary | ICD-10-CM | POA: Diagnosis not present

## 2019-07-31 DIAGNOSIS — L570 Actinic keratosis: Secondary | ICD-10-CM | POA: Diagnosis not present

## 2019-08-02 DIAGNOSIS — H02055 Trichiasis without entropian left lower eyelid: Secondary | ICD-10-CM | POA: Diagnosis not present

## 2019-08-02 DIAGNOSIS — H02052 Trichiasis without entropian right lower eyelid: Secondary | ICD-10-CM | POA: Diagnosis not present

## 2019-08-02 DIAGNOSIS — H2513 Age-related nuclear cataract, bilateral: Secondary | ICD-10-CM | POA: Diagnosis not present

## 2019-08-02 DIAGNOSIS — H52203 Unspecified astigmatism, bilateral: Secondary | ICD-10-CM | POA: Diagnosis not present

## 2019-09-20 DIAGNOSIS — L820 Inflamed seborrheic keratosis: Secondary | ICD-10-CM | POA: Diagnosis not present

## 2019-10-04 ENCOUNTER — Ambulatory Visit: Payer: Self-pay | Attending: Internal Medicine

## 2019-10-04 DIAGNOSIS — Z20822 Contact with and (suspected) exposure to covid-19: Secondary | ICD-10-CM

## 2019-10-05 LAB — SARS-COV-2, NAA 2 DAY TAT

## 2019-10-05 LAB — NOVEL CORONAVIRUS, NAA: SARS-CoV-2, NAA: NOT DETECTED

## 2019-10-10 DIAGNOSIS — M5031 Other cervical disc degeneration,  high cervical region: Secondary | ICD-10-CM | POA: Diagnosis not present

## 2019-10-10 DIAGNOSIS — M9901 Segmental and somatic dysfunction of cervical region: Secondary | ICD-10-CM | POA: Diagnosis not present

## 2019-10-11 DIAGNOSIS — M9901 Segmental and somatic dysfunction of cervical region: Secondary | ICD-10-CM | POA: Diagnosis not present

## 2019-10-11 DIAGNOSIS — M5031 Other cervical disc degeneration,  high cervical region: Secondary | ICD-10-CM | POA: Diagnosis not present

## 2019-10-12 DIAGNOSIS — M9901 Segmental and somatic dysfunction of cervical region: Secondary | ICD-10-CM | POA: Diagnosis not present

## 2019-10-12 DIAGNOSIS — M5031 Other cervical disc degeneration,  high cervical region: Secondary | ICD-10-CM | POA: Diagnosis not present

## 2019-10-16 DIAGNOSIS — M9901 Segmental and somatic dysfunction of cervical region: Secondary | ICD-10-CM | POA: Diagnosis not present

## 2019-10-16 DIAGNOSIS — M5031 Other cervical disc degeneration,  high cervical region: Secondary | ICD-10-CM | POA: Diagnosis not present

## 2019-10-19 DIAGNOSIS — M5031 Other cervical disc degeneration,  high cervical region: Secondary | ICD-10-CM | POA: Diagnosis not present

## 2019-10-19 DIAGNOSIS — M9901 Segmental and somatic dysfunction of cervical region: Secondary | ICD-10-CM | POA: Diagnosis not present

## 2019-10-24 DIAGNOSIS — H6123 Impacted cerumen, bilateral: Secondary | ICD-10-CM | POA: Diagnosis not present

## 2019-10-24 DIAGNOSIS — Z8669 Personal history of other diseases of the nervous system and sense organs: Secondary | ICD-10-CM | POA: Diagnosis not present

## 2019-10-24 DIAGNOSIS — J343 Hypertrophy of nasal turbinates: Secondary | ICD-10-CM | POA: Diagnosis not present

## 2019-10-26 DIAGNOSIS — M5031 Other cervical disc degeneration,  high cervical region: Secondary | ICD-10-CM | POA: Diagnosis not present

## 2019-10-26 DIAGNOSIS — M9901 Segmental and somatic dysfunction of cervical region: Secondary | ICD-10-CM | POA: Diagnosis not present

## 2020-01-15 DIAGNOSIS — H02052 Trichiasis without entropian right lower eyelid: Secondary | ICD-10-CM | POA: Diagnosis not present

## 2020-01-15 DIAGNOSIS — H25813 Combined forms of age-related cataract, bilateral: Secondary | ICD-10-CM | POA: Diagnosis not present

## 2020-01-15 DIAGNOSIS — D3131 Benign neoplasm of right choroid: Secondary | ICD-10-CM | POA: Diagnosis not present

## 2020-01-15 DIAGNOSIS — H353111 Nonexudative age-related macular degeneration, right eye, early dry stage: Secondary | ICD-10-CM | POA: Diagnosis not present

## 2020-01-15 DIAGNOSIS — H35361 Drusen (degenerative) of macula, right eye: Secondary | ICD-10-CM | POA: Diagnosis not present

## 2020-01-15 DIAGNOSIS — H43813 Vitreous degeneration, bilateral: Secondary | ICD-10-CM | POA: Diagnosis not present

## 2020-02-09 ENCOUNTER — Other Ambulatory Visit: Payer: Self-pay

## 2020-02-09 DIAGNOSIS — Z20822 Contact with and (suspected) exposure to covid-19: Secondary | ICD-10-CM | POA: Diagnosis not present

## 2020-02-12 LAB — NOVEL CORONAVIRUS, NAA: SARS-CoV-2, NAA: NOT DETECTED

## 2020-02-27 DIAGNOSIS — R350 Frequency of micturition: Secondary | ICD-10-CM | POA: Diagnosis not present

## 2020-02-27 DIAGNOSIS — N401 Enlarged prostate with lower urinary tract symptoms: Secondary | ICD-10-CM | POA: Diagnosis not present

## 2020-02-27 DIAGNOSIS — R3912 Poor urinary stream: Secondary | ICD-10-CM | POA: Diagnosis not present

## 2020-04-04 DIAGNOSIS — H903 Sensorineural hearing loss, bilateral: Secondary | ICD-10-CM | POA: Diagnosis not present

## 2020-04-04 DIAGNOSIS — H9313 Tinnitus, bilateral: Secondary | ICD-10-CM | POA: Diagnosis not present

## 2020-04-04 DIAGNOSIS — Z570 Occupational exposure to noise: Secondary | ICD-10-CM | POA: Diagnosis not present

## 2020-04-04 DIAGNOSIS — Z822 Family history of deafness and hearing loss: Secondary | ICD-10-CM | POA: Diagnosis not present

## 2020-07-09 DIAGNOSIS — K219 Gastro-esophageal reflux disease without esophagitis: Secondary | ICD-10-CM | POA: Diagnosis not present

## 2020-07-09 DIAGNOSIS — G629 Polyneuropathy, unspecified: Secondary | ICD-10-CM | POA: Diagnosis not present

## 2020-07-09 DIAGNOSIS — Z125 Encounter for screening for malignant neoplasm of prostate: Secondary | ICD-10-CM | POA: Diagnosis not present

## 2020-07-12 ENCOUNTER — Encounter: Payer: Self-pay | Admitting: Gastroenterology

## 2020-07-16 DIAGNOSIS — M5416 Radiculopathy, lumbar region: Secondary | ICD-10-CM | POA: Diagnosis not present

## 2020-07-16 DIAGNOSIS — R82998 Other abnormal findings in urine: Secondary | ICD-10-CM | POA: Diagnosis not present

## 2020-07-16 DIAGNOSIS — G629 Polyneuropathy, unspecified: Secondary | ICD-10-CM | POA: Diagnosis not present

## 2020-07-16 DIAGNOSIS — M19079 Primary osteoarthritis, unspecified ankle and foot: Secondary | ICD-10-CM | POA: Diagnosis not present

## 2020-07-16 DIAGNOSIS — Z0000 Encounter for general adult medical examination without abnormal findings: Secondary | ICD-10-CM | POA: Diagnosis not present

## 2020-07-16 DIAGNOSIS — G4700 Insomnia, unspecified: Secondary | ICD-10-CM | POA: Diagnosis not present

## 2020-07-16 DIAGNOSIS — N400 Enlarged prostate without lower urinary tract symptoms: Secondary | ICD-10-CM | POA: Diagnosis not present

## 2020-07-16 DIAGNOSIS — K219 Gastro-esophageal reflux disease without esophagitis: Secondary | ICD-10-CM | POA: Diagnosis not present

## 2020-07-30 ENCOUNTER — Ambulatory Visit: Payer: Medicare HMO | Admitting: Gastroenterology

## 2020-07-30 ENCOUNTER — Encounter: Payer: Self-pay | Admitting: Gastroenterology

## 2020-07-30 VITALS — BP 134/84 | HR 73 | Ht 71.0 in | Wt 170.2 lb

## 2020-07-30 DIAGNOSIS — R1011 Right upper quadrant pain: Secondary | ICD-10-CM | POA: Diagnosis not present

## 2020-07-30 DIAGNOSIS — K219 Gastro-esophageal reflux disease without esophagitis: Secondary | ICD-10-CM | POA: Diagnosis not present

## 2020-07-30 DIAGNOSIS — R1013 Epigastric pain: Secondary | ICD-10-CM

## 2020-07-30 DIAGNOSIS — R109 Unspecified abdominal pain: Secondary | ICD-10-CM

## 2020-07-30 NOTE — Patient Instructions (Signed)
If you are age 72 or older, your body mass index should be between 23-30. Your Body mass index is 23.75 kg/m. If this is out of the aforementioned range listed, please consider follow up with your Primary Care Provider.  If you are age 11 or younger, your body mass index should be between 19-25. Your Body mass index is 23.75 kg/m. If this is out of the aformentioned range listed, please consider follow up with your Primary Care Provider.    Due to recent changes in healthcare laws, you may see the results of your imaging and laboratory studies on MyChart before your provider has had a chance to review them.  We understand that in some cases there may be results that are confusing or concerning to you. Not all laboratory results come back in the same time frame and the provider may be waiting for multiple results in order to interpret others.  Please give [place] 48 hours in order for your provider to thoroughly review all the results before contacting the office for clarification of your results.   Thank you for choosing me and [facility].  Vito Cirigliano, D.O.

## 2020-07-30 NOTE — Progress Notes (Signed)
Chief Complaint: GERD  Referring Provider:     Self   HPI:     Norman Pearson is a 70 y.o. male referred to the [facility] for evaluation of reflux symptoms.   Has had sxs for years, worse at night and will wake up and take Tums. Sxs started to worsen at Christmas. Dyspepsia/upper abdominal pain, "lump in throat". No HB. No dysphagia. No improvement with trial of famotidine and decreasing seasoning in foods.   Borrowed his wife's pantoprazole 40 mg BID for the last 2 weeks or so with improvement.   No melena, hematochezia. No previous EGD.   Separately, he has occasional RUQ pain, which is unrelated to the above sxs and no associated n/v/d/c/f/c.  Pain is nonradiating.  Very limited records for review in EMR, to include no labs or imaging since 2012.  Endoscopic History: -Colonoscopy 2006, Dr. Amedeo Plenty. Normal.  -Colonoscopy 2016, Dr. Amedeo Plenty.  Normal. Repeat 10 years  History reviewed. No pertinent past medical history.   Past Surgical History:  Procedure Laterality Date  . COLONOSCOPY  2016   Dr Starr Sinclair GI. Said he was clean  . EXTERNAL FIXATION LEG  04/14/2011   Procedure: EXTERNAL FIXATION LEG;  Surgeon: Wylene Simmer, MD;  Location: [place];  Service: Orthopedics;  Laterality: Left;  External fixation left lower leg  . I & D EXTREMITY  04/14/2011   Procedure: IRRIGATION AND DEBRIDEMENT EXTREMITY;  Surgeon: Wylene Simmer, MD;  Location: [place];  Service: Orthopedics;  Laterality: Left;  Repair of left hand laceration  . ORIF TIBIA FRACTURE  04/23/2011   Procedure: OPEN REDUCTION INTERNAL FIXATION (ORIF) TIBIA FRACTURE;  Surgeon: Wylene Simmer, MD;  Location: [place];  Service: Orthopedics;  Laterality: Left;  Removal of External Fixator, Open reduction internal fixation Left Tibial Pilon Fracture   . OTHER SURGICAL HISTORY     tendon surgery   Family History  Problem Relation Age of Onset  . Colon cancer Neg Hx   . Esophageal cancer Neg Hx     Social History   Tobacco Use  . Smoking status: Former Smoker    Quit date: 04/13/1974    Years since quitting: 46.3  . Smokeless tobacco: Former Systems developer  . Tobacco comment: quit over 35 years ago   Vaping Use  . Vaping Use: Never used  Substance Use Topics  . Alcohol use: Yes    Alcohol/week: 3.0 standard drinks    Types: 3 Standard drinks or equivalent per week  . Drug use: No   Current Outpatient Medications  Medication Sig Dispense Refill  . loratadine (CLARITIN) 10 MG tablet Take 10 mg by mouth as needed for allergies (Usually in May).    Marland Kitchen MILK THISTLE PO Take 1 tablet by mouth daily.    . Saw Palmetto, Serenoa repens, (SAW PALMETTO PO) Take 1 tablet by mouth daily.     No current facility-administered medications for this visit.   Allergies  Allergen Reactions  . Other Itching    Grass pollen  . Fentanyl     "bad taste"     Review of Systems: All systems reviewed and negative except where noted in HPI.     Physical Exam:    Wt Readings from Last 3 Encounters:  07/30/20 170 lb 4 oz (77.2 kg)  05/06/11 168 lb 6.4 oz (76.4 kg)  04/17/11 162 lb (73.5 kg)    BP 134/84   Pulse 73  Ht 5\' 11"  (1.803 m)   Wt 170 lb 4 oz (77.2 kg)   BMI 23.75 kg/m  Constitutional:  Pleasant, in no acute distress. Psychiatric: Normal mood and affect. Behavior is normal. EENT: Pupils normal.  Conjunctivae are normal. No scleral icterus. Neck supple. No cervical LAD. Cardiovascular: Normal rate, regular rhythm. No edema Pulmonary/chest: Effort normal and breath sounds normal. No wheezing, rales or rhonchi. Abdominal: Soft, nondistended, nontender. Bowel sounds active throughout. There are no masses palpable. No hepatomegaly. Neurological: Alert and oriented to person place and time. Skin: Skin is warm and dry. No rashes noted.   ASSESSMENT AND PLAN;   1) GERD 2) Globus sensation 3) Dyspepsia 4) RUQ pain  - EGD to evaluate for mucosal/luminal pathology along with  Barrett's Esophagus screening -Evaluate for LES laxity, hiatal hernia, erosive esophagitis at time of EGD -Evaluate for luminal narrowing, stricture, etc. with esophageal dilation and/or biopsies as appropriate -Reasonable to continue Protonix 40 mg bid x4 weeks for diagnostic/therapeutic intent, with plan to then wean to lowest effective dose  The indications, risks, and benefits of EGD were explained to the patient in detail. Risks include but are not limited to bleeding, perforation, adverse reaction to medications, and cardiopulmonary compromise. Sequelae include but are not limited to the possibility of surgery, hospitalization, and mortality. The patient verbalized understanding and wished to proceed. All questions answered, referred to scheduler. Further recommendations pending results of the exam.    Sidnie Swalley V Tammie Yanda, DO, FACG  07/30/2020, 1:25 PM   No ref. provider found

## 2020-08-06 DIAGNOSIS — H02055 Trichiasis without entropian left lower eyelid: Secondary | ICD-10-CM | POA: Diagnosis not present

## 2020-08-06 DIAGNOSIS — D3131 Benign neoplasm of right choroid: Secondary | ICD-10-CM | POA: Diagnosis not present

## 2020-08-06 DIAGNOSIS — H52203 Unspecified astigmatism, bilateral: Secondary | ICD-10-CM | POA: Diagnosis not present

## 2020-08-06 DIAGNOSIS — H02052 Trichiasis without entropian right lower eyelid: Secondary | ICD-10-CM | POA: Diagnosis not present

## 2020-08-21 ENCOUNTER — Ambulatory Visit (AMBULATORY_SURGERY_CENTER): Payer: Medicare HMO | Admitting: Gastroenterology

## 2020-08-21 ENCOUNTER — Telehealth: Payer: Self-pay | Admitting: General Surgery

## 2020-08-21 ENCOUNTER — Encounter: Payer: Self-pay | Admitting: Gastroenterology

## 2020-08-21 ENCOUNTER — Other Ambulatory Visit: Payer: Self-pay

## 2020-08-21 VITALS — BP 95/71 | HR 67 | Temp 97.3°F | Resp 16 | Ht 71.0 in | Wt 170.0 lb

## 2020-08-21 DIAGNOSIS — K317 Polyp of stomach and duodenum: Secondary | ICD-10-CM

## 2020-08-21 DIAGNOSIS — K2950 Unspecified chronic gastritis without bleeding: Secondary | ICD-10-CM | POA: Diagnosis not present

## 2020-08-21 DIAGNOSIS — R1011 Right upper quadrant pain: Secondary | ICD-10-CM | POA: Diagnosis not present

## 2020-08-21 DIAGNOSIS — K219 Gastro-esophageal reflux disease without esophagitis: Secondary | ICD-10-CM | POA: Diagnosis not present

## 2020-08-21 DIAGNOSIS — R12 Heartburn: Secondary | ICD-10-CM

## 2020-08-21 DIAGNOSIS — R1013 Epigastric pain: Secondary | ICD-10-CM

## 2020-08-21 MED ORDER — AMBULATORY NON FORMULARY MEDICATION: 0 refills | Status: AC

## 2020-08-21 MED ORDER — SODIUM CHLORIDE 0.9 % IV SOLN: 500.0000 mL | Freq: Once | INTRAVENOUS | Status: DC

## 2020-08-21 NOTE — Progress Notes (Signed)
Called to room to assist during endoscopic procedure.  Patient ID and intended procedure confirmed with present staff. Received instructions for my participation in the procedure from the performing physician.  

## 2020-08-21 NOTE — Progress Notes (Signed)
Pt's states no medical or surgical changes since previsit or office visit. 

## 2020-08-21 NOTE — Progress Notes (Signed)
pt tolerated well. VSS. awake and to recovery. Report given to RN. Scope guard left insitu to recovery. No signs of trauma.

## 2020-08-21 NOTE — Telephone Encounter (Signed)
-----   Message from Lavena Bullion, DO sent at 08/21/2020 11:39 AM EDT ----- Can you please call this patient today/tomorrow. They apparently asked about the medication for dyspepsia I had discussed w/ them thinking it was a prescription, but it is OTC FD Guard that I was telling them about. Can pick up, or if we have sample, that will be great. Thanks.

## 2020-08-21 NOTE — Op Note (Signed)
[name] Patient Name: Norman Pearson Procedure Date: 08/21/2020 10:03 AM MRN: [id number] Endoscopist: Gerrit Heck , MD Age: 70 Referring MD:  Date of Birth: 1951-04-07 Gender: Male Account #: [id number] Procedure:                Upper GI endoscopy Indications:              Abdominal pain in the right upper quadrant,                            Heartburn, Suspected esophageal reflux, Globus                            sensation Medicines:                Monitored Anesthesia Care Procedure:                Pre-Anesthesia Assessment:                           - Prior to the procedure, a History and Physical                            was performed, and patient medications and                            allergies were reviewed. The patient's tolerance of                            previous anesthesia was also reviewed. The risks                            and benefits of the procedure and the sedation                            options and risks were discussed with the patient.                            All questions were answered, and informed consent                            was obtained. Prior Anticoagulants: The patient has                            taken no previous anticoagulant or antiplatelet                            agents. ASA Grade Assessment: II - A patient with                            mild systemic disease. After reviewing the risks                            and benefits, the patient was deemed in  satisfactory condition to undergo the procedure.                           After obtaining informed consent, the endoscope was                            passed under direct vision. Throughout the                            procedure, the patient's blood pressure, pulse, and                            oxygen saturations were monitored continuously. The                            Endoscope was introduced through the mouth, and                             advanced to the second part of duodenum. The upper                            GI endoscopy was accomplished without difficulty.                            The patient tolerated the procedure well. Scope In: Scope Out: Findings:                 The examined esophagus was normal.                           The Z-line was regular and was found 45 cm from the                            incisors.                           The gastroesophageal flap valve was visualized                            endoscopically and classified as Hill Grade II                            (fold present, opens with respiration).                           A single 3 mm sessile polyp with no bleeding was                            found in the gastric fundus. The polyp was removed                            with a cold biopsy forceps. Resection and retrieval                            were  complete. Estimated blood loss was minimal.                           Normal mucosa was found in the gastric fundus, in                            the gastric body, at the incisura, in the gastric                            antrum and at the pylorus. Biopsies were taken with                            a cold forceps for Helicobacter pylori testing.                            Estimated blood loss was minimal.                           The examined duodenum was normal. Complications:            No immediate complications. Estimated Blood Loss:     Estimated blood loss was minimal. Impression:               - Normal esophagus.                           - Z-line regular, 45 cm from the incisors.                           - Gastroesophageal flap valve classified as Hill                            Grade II (fold present, opens with respiration).                           - A single gastric polyp. Resected and retrieved.                           - Normal mucosa was found in the gastric fundus, in                             the gastric body, in the incisura, in the antrum                            and in the pylorus. Biopsied.                           - Normal examined duodenum. Recommendation:           - Patient has a contact number available for                            emergencies. The signs and symptoms of potential  delayed complications were discussed with the                            patient. Return to normal activities tomorrow.                            Written discharge instructions were provided to the                            patient.                           - Resume previous diet.                           - Continue present medications.                           - Await pathology results.                           - Return to GI clinic in 3 months or sooner as                            needed. Gerrit Heck, MD 08/21/2020 10:27:18 AM

## 2020-08-21 NOTE — Progress Notes (Signed)
C.W. vital signs. 

## 2020-08-21 NOTE — Telephone Encounter (Signed)
Contacted the patient regarding picking up Samples of FD guard. Patient will come to the clinic on 08/22/2020 of 08/23/2020 to pick up his samples. Explained to the patient 7 boxes is enough for 7 days. Take 2 tables 30-60 minutes prior to meals twice daily. The patient verbalized understanding.

## 2020-08-21 NOTE — Patient Instructions (Signed)
Please read handouts provided. Continue present medications. Await pathology results. Return to GI clinic in 3 months or sooner as needed.     YOU HAD AN ENDOSCOPIC PROCEDURE TODAY AT [facility]:   Refer to the procedure report that was given to you for any specific questions about what was found during the examination.  If the procedure report does not answer your questions, please call your gastroenterologist to clarify.  If you requested that your care partner not be given the details of your procedure findings, then the procedure report has been included in a sealed envelope for you to review at your convenience later.  YOU SHOULD EXPECT: Some feelings of bloating in the abdomen. Passage of more gas than usual.  Walking can help get rid of the air that was put into your GI tract during the procedure and reduce the bloating. If you had a lower endoscopy (such as a colonoscopy or flexible sigmoidoscopy) you may notice spotting of blood in your stool or on the toilet paper. If you underwent a bowel prep for your procedure, you may not have a normal bowel movement for a few days.  Please Note:  You might notice some irritation and congestion in your nose or some drainage.  This is from the oxygen used during your procedure.  There is no need for concern and it should clear up in a day or so.  SYMPTOMS TO REPORT IMMEDIATELY:   Following upper endoscopy (EGD)  Vomiting of blood or coffee ground material  New chest pain or pain under the shoulder blades  Painful or persistently difficult swallowing  New shortness of breath  Fever of 100F or higher  Black, tarry-looking stools  For urgent or emergent issues, a gastroenterologist can be reached at any hour by calling [phone]. Do not use MyChart messaging for urgent concerns.    DIET:  We do recommend a small meal at first, but then you may proceed to your regular diet.  Drink plenty of fluids but you should avoid  alcoholic beverages for 24 hours.  ACTIVITY:  You should plan to take it easy for the rest of today and you should NOT DRIVE or use heavy machinery until tomorrow (because of the sedation medicines used during the test).    FOLLOW UP: Our staff will call the number listed on your records 48-72 hours following your procedure to check on you and address any questions or concerns that you may have regarding the information given to you following your procedure. If we do not reach you, we will leave a message.  We will attempt to reach you two times.  During this call, we will ask if you have developed any symptoms of COVID 19. If you develop any symptoms (ie: fever, flu-like symptoms, shortness of breath, cough etc.) before then, please call [phone].  If you test positive for Covid 19 in the 2 weeks post procedure, please call and report this information to [place].    If any biopsies were taken you will be contacted by phone or by letter within the next 1-3 weeks.  Please call us at [phone] if you have not heard about the biopsies in 3 weeks.    SIGNATURES/CONFIDENTIALITY: You and/or your care partner have signed paperwork which will be entered into your electronic medical record.  These signatures attest to the fact that that the information above on your After Visit Summary has been reviewed and is understood.  Full responsibility of the  confidentiality of this discharge information lies with you and/or your care-partner. 

## 2020-08-23 ENCOUNTER — Telehealth: Payer: Self-pay

## 2020-08-23 NOTE — Telephone Encounter (Signed)
  Follow up Call-  Call back number 08/21/2020  Post procedure Call Back phone  # [phone]  Permission to leave phone message Yes  Some recent data might be hidden     Patient questions:  Do you have a fever, pain , or abdominal swelling? No. Pain Score  0 *  Have you tolerated food without any problems? Yes.    Have you been able to return to your normal activities? Yes.    Do you have any questions about your discharge instructions: Diet   No. Medications  No. Follow up visit  No.  Do you have questions or concerns about your Care? No.  Actions: * If pain score is 4 or above: No action needed, pain <4.  1. Have you developed a fever since your procedure? no  2.   Have you had an respiratory symptoms (SOB or cough) since your procedure? no  3.   Have you tested positive for COVID 19 since your procedure no  4.   Have you had any family members/close contacts diagnosed with the COVID 19 since your procedure?  no   If yes to any of these questions please route to Joylene John, RN and Joella Prince, RN

## 2020-08-23 NOTE — Telephone Encounter (Signed)
First post procedure follow up call, no answer 

## 2020-08-23 NOTE — Telephone Encounter (Signed)
Per 08/21/20 procedure note - Return to GI clinic in 3 months or sooner as needed  Patient is scheduled for a 3 month follow up with Dr. Bryan Lemma on Tuesday, 11/26/20 at 1:40 PM. Letter mailed to patient with appointment information.

## 2020-08-29 ENCOUNTER — Encounter: Payer: Self-pay | Admitting: Gastroenterology

## 2020-10-15 DIAGNOSIS — H02052 Trichiasis without entropian right lower eyelid: Secondary | ICD-10-CM | POA: Diagnosis not present

## 2020-10-23 DIAGNOSIS — X32XXXD Exposure to sunlight, subsequent encounter: Secondary | ICD-10-CM | POA: Diagnosis not present

## 2020-10-23 DIAGNOSIS — D225 Melanocytic nevi of trunk: Secondary | ICD-10-CM | POA: Diagnosis not present

## 2020-10-23 DIAGNOSIS — L821 Other seborrheic keratosis: Secondary | ICD-10-CM | POA: Diagnosis not present

## 2020-10-23 DIAGNOSIS — L570 Actinic keratosis: Secondary | ICD-10-CM | POA: Diagnosis not present

## 2020-11-26 ENCOUNTER — Ambulatory Visit: Payer: Medicare HMO | Admitting: Gastroenterology

## 2020-11-26 DIAGNOSIS — D225 Melanocytic nevi of trunk: Secondary | ICD-10-CM | POA: Diagnosis not present

## 2020-11-26 DIAGNOSIS — D485 Neoplasm of uncertain behavior of skin: Secondary | ICD-10-CM | POA: Diagnosis not present

## 2020-12-16 ENCOUNTER — Encounter: Payer: Self-pay | Admitting: Gastroenterology

## 2020-12-16 ENCOUNTER — Other Ambulatory Visit: Payer: Self-pay

## 2020-12-16 ENCOUNTER — Ambulatory Visit (INDEPENDENT_AMBULATORY_CARE_PROVIDER_SITE_OTHER): Payer: Medicare HMO | Admitting: Gastroenterology

## 2020-12-16 VITALS — BP 120/84 | HR 71 | Ht 71.0 in | Wt 171.2 lb

## 2020-12-16 DIAGNOSIS — K219 Gastro-esophageal reflux disease without esophagitis: Secondary | ICD-10-CM | POA: Diagnosis not present

## 2020-12-16 NOTE — Patient Instructions (Signed)
If you are age 72 or older, your body mass index should be between 23-30. Your Body mass index is 23.88 kg/m. If this is out of the aforementioned range listed, please consider follow up with your Primary Care Provider.  If you are age 44 or younger, your body mass index should be between 19-25. Your Body mass index is 23.88 kg/m. If this is out of the aformentioned range listed, please consider follow up with your Primary Care Provider.   Due to recent changes in healthcare laws, you may see the results of your imaging and laboratory studies on MyChart before your provider has had a chance to review them.  We understand that in some cases there may be results that are confusing or concerning to you. Not all laboratory results come back in the same time frame and the provider may be waiting for multiple results in order to interpret others.  Please give [place] 48 hours in order for your provider to thoroughly review all the results before contacting the office for clarification of your results.  Return to the clinic as needed.   Thank you for choosing me and [facility].  Vito Cirigliano, D.O.

## 2020-12-16 NOTE — Progress Notes (Signed)
    Chief Complaint:    GERD  GI History: 70 year old male with longstanding history of GERD. - Index symptoms: dyspepsia, globus sensation.  No heartburn, dysphagia - Medications trialed: Famotidine, Tums, Protonix - Current medications: None - Exacerbating features: Over seasoned foods, overeating  Endoscopic History: -Colonoscopy 2006, Dr. Amedeo Plenty. Normal.  -Colonoscopy 2016, Dr. Amedeo Plenty.  Normal. Repeat 10 years - EGD 07/2020, Dr. Bryan Lemma: Normal esophagus, Hill grade 2, single benign gastric fundic gland polyp  HPI:     Patient is a 70 y.o. male presenting to the Gastroenterology Clinic for follow-up.  Initially seen by me on 07/30/2020 for evaluation of reflux symptoms as outlined above.  Was also having intermittent RUQ pain unrelated to his reflux.  At that time, reflux was controlled since starting his wife's pantoprazole 40 mg bid.  Completed EGD in 07/2020 which was largely unremarkable as outlined above.  Today, he states reflux sxs well controlled and RUQ pain has resolved. Has stopped Protonix for months now. Will still have occasional breakthrough sxs at night or heavily seasoned foods, but typically well controlled with dietary mods and prn FDGard.   Review of systems:     No chest pain, no SOB, no fevers, no urinary sx   History reviewed. No pertinent past medical history.  Patient's surgical history, family medical history, social history, medications and allergies were all reviewed in Epic    Current Outpatient Medications  Medication Sig Dispense Refill   AMBULATORY NON FORMULARY MEDICATION FD guard take 2 capsules twice daily 30-60 minutes before meals  Lot [id number] Exp 05/2021 x 7 boxes 28 capsule 0   HYDROcodone-acetaminophen (NORCO/VICODIN) 5-325 MG tablet prn     loratadine (CLARITIN) 10 MG tablet Take 10 mg by mouth as needed for allergies (Usually in May).     LORazepam (ATIVAN) 0.5 MG tablet Take by mouth.     MILK THISTLE PO Take 1 tablet by mouth daily.      Saw Palmetto, Serenoa repens, (SAW PALMETTO PO) Take 1 tablet by mouth daily.     No current facility-administered medications for this visit.    Physical Exam:     BP 120/84   Pulse 71   Ht '5\' 11"'$  (1.803 m)   Wt 171 lb 4 oz (77.7 kg)   SpO2 97%   BMI 23.88 kg/m   GENERAL:  Pleasant male in NAD PSYCH: : Cooperative, normal affect Musculoskeletal:  Normal muscle tone, normal strength NEURO: Alert and oriented x 3, no focal neurologic deficits   IMPRESSION and PLAN:    1) GERD 2) Globus sensation - Symptoms relatively well controlled off PPI for several months now - Continue antireflux dietary and lifestyle modifications - Avoid exacerbating features - If recurrence of symptoms, okay to retrial course of PPI prn - RTC prn   Lavena Bullion ,DO, FACG 12/16/2020, 9:29 AM

## 2020-12-23 DIAGNOSIS — U071 COVID-19: Secondary | ICD-10-CM

## 2020-12-23 HISTORY — DX: COVID-19: U07.1

## 2021-01-14 ENCOUNTER — Emergency Department (HOSPITAL_COMMUNITY)
Admission: EM | Admit: 2021-01-14 | Discharge: 2021-01-14 | Disposition: A | Discharge status: Home / Self Care | Payer: Medicare HMO | Attending: Emergency Medicine | Admitting: Emergency Medicine

## 2021-01-14 ENCOUNTER — Emergency Department (HOSPITAL_COMMUNITY): Payer: Medicare HMO

## 2021-01-14 DIAGNOSIS — U071 COVID-19: Secondary | ICD-10-CM | POA: Insufficient documentation

## 2021-01-14 DIAGNOSIS — R0602 Shortness of breath: Secondary | ICD-10-CM | POA: Diagnosis not present

## 2021-01-14 DIAGNOSIS — Z87891 Personal history of nicotine dependence: Secondary | ICD-10-CM | POA: Diagnosis not present

## 2021-01-14 DIAGNOSIS — R0981 Nasal congestion: Secondary | ICD-10-CM | POA: Diagnosis not present

## 2021-01-14 DIAGNOSIS — R918 Other nonspecific abnormal finding of lung field: Secondary | ICD-10-CM | POA: Diagnosis not present

## 2021-01-14 LAB — COMPREHENSIVE METABOLIC PANEL
ALT: 29 U/L (ref 0–44)
AST: 44 U/L — ABNORMAL HIGH (ref 15–41)
Albumin: 3.7 g/dL (ref 3.5–5.0)
Alkaline Phosphatase: 51 U/L (ref 38–126)
Anion gap: 9 (ref 5–15)
BUN: 10 mg/dL (ref 8–23)
CO2: 22 mmol/L (ref 22–32)
Calcium: 9 mg/dL (ref 8.9–10.3)
Chloride: 106 mmol/L (ref 98–111)
Creatinine, Ser: 1.08 mg/dL (ref 0.61–1.24)
GFR, Estimated: >60 mL/min (ref >60)
Glucose, Bld: 119 mg/dL — ABNORMAL HIGH (ref 70–99)
Potassium: 3.6 mmol/L (ref 3.5–5.1)
Sodium: 137 mmol/L (ref 135–145)
Total Bilirubin: 1.1 mg/dL (ref 0.3–1.2)
Total Protein: 6.7 g/dL (ref 6.5–8.1)

## 2021-01-14 LAB — CBC
HCT: 49.5 % (ref 39.0–52.0)
Hemoglobin: 16.9 g/dL (ref 13.0–17.0)
MCH: 31.6 pg (ref 26.0–34.0)
MCHC: 34.1 g/dL (ref 30.0–36.0)
MCV: 92.5 fL (ref 80.0–100.0)
Platelets: 157 10*3/uL (ref 150–400)
RBC: 5.35 MIL/uL (ref 4.22–5.81)
RDW: 12.8 % (ref 11.5–15.5)
WBC: 3.3 10*3/uL — ABNORMAL LOW (ref 4.0–10.5)
nRBC: 0 % (ref 0.0–0.2)

## 2021-01-14 LAB — SARS CORONAVIRUS 2 (TAT 6-24 HRS): SARS Coronavirus 2: POSITIVE — AB

## 2021-01-14 LAB — D-DIMER, QUANTITATIVE: D-Dimer, Quant: 0.45 ug/mL-FEU (ref 0.00–0.50)

## 2021-01-14 NOTE — ED Provider Notes (Addendum)
[facility] EM[facility]ENC[facility] DEPARTMENT Provider Note   CSN: [id number] Arrival date & time: 01/14/21  0730     History No chief complaint on file.   Norman Pearson is a 70 y.o. male.  Patient is a 70 year old male who presents with shortness of breath.  He states that his wife had COVID about a week ago.  He had similar symptoms but did not test himself although he assumed he had COVID given that his wife had tested positive for COVID and he had similar symptoms.  He had some congestion and coughing with associated fatigue.  No fevers.  No nausea or vomiting.  He had been taking some guaifenesin for symptomatic relief and felt like he was getting better although yesterday felt he was getting worse again with some increased congestion and shortness of breath.  He has a little bit of pain in the center of his chest from coughing but otherwise no chest pain.  He does check his pulse ox at home and has not noticed any hypoxia.  No leg swelling.  No history of lung disease.  No history of prior known cardiac disease.  He quit smoking 50 years ago.      No past medical history on file.  Patient Active Problem List   Diagnosis Date Noted   Acute urinary retention 04/27/2011   Anemia associated with acute blood loss 04/15/2011   Hypocalcemia 04/15/2011   Unstable burst fracture of first lumbar vertebra ([place]) 04/14/2011   Accidental fall from ladder 04/14/2011   Closed fracture of left fibula 04/14/2011   Laceration of fourth finger of left hand 04/14/2011   Closed fracture of left distal tibia 04/14/2011    Past Surgical History:  Procedure Laterality Date   COLONOSCOPY  2016   Dr Starr [facility]. Michela Pitcher he was clean   EXTERNAL FIXATION LEG  04/14/2011   Procedure: EXTERNAL FIXATION LEG;  Surgeon: Wylene Simmer, MD;  Location: [place];  Service: Orthopedics;  Laterality: Left;  External fixation left lower leg   I & D EXTREMITY  04/14/2011   Procedure: IRRIGATION AND  DEBRIDEMENT EXTREMITY;  Surgeon: Wylene Simmer, MD;  Location: [place];  Service: Orthopedics;  Laterality: Left;  Repair of left hand laceration   ORIF TIBIA FRACTURE  04/23/2011   Procedure: OPEN REDUCTION INTERNAL FIXATION (ORIF) TIBIA FRACTURE;  Surgeon: Wylene Simmer, MD;  Location: [place];  Service: Orthopedics;  Laterality: Left;  Removal of External Fixator, Open reduction internal fixation Left Tibial Pilon Fracture    OTHER SURGICAL HISTORY     tendon surgery       Family History  Problem Relation Age of Onset   Colon cancer Neg Hx    Esophageal cancer Neg Hx    Pancreatic cancer Neg Hx    Liver disease Neg Hx     Social History   Tobacco Use   Smoking status: Former    Types: Cigarettes    Quit date: 04/13/1974    Years since quitting: 46.7   Smokeless tobacco: Former   Tobacco comments:    quit over 35 years ago   Vaping Use   Vaping Use: Never used  Substance Use Topics   Alcohol use: Yes    Alcohol/week: 3.0 standard drinks    Types: 3 Standard drinks or equivalent per week   Drug use: No    Home Medications Prior to Admission medications   Medication Sig Start Date End Date Taking? Authorizing Provider  AMBULATORY NON FORMULARY  MEDICATION FD guard take 2 capsules twice daily 30-60 minutes before meals  Lot [id number] Exp 05/2021 x 7 boxes 08/21/20   Cirigliano, Vito V, DO  HYDROcodone-acetaminophen (NORCO/VICODIN) 5-325 MG tablet prn    [provider]  loratadine (CLARITIN) 10 MG tablet Take 10 mg by mouth as needed for allergies (Usually in May).    [provider]  LORazepam (ATIVAN) 0.5 MG tablet Take by mouth.    [provider]  MILK THISTLE PO Take 1 tablet by mouth daily.    [provider]  Saw Palmetto, Serenoa repens, (SAW PALMETTO PO) Take 1 tablet by mouth daily.    [provider]    Allergies    Other and Fentanyl  Review of Systems   Review of Systems  Constitutional:  Positive for fatigue.  Negative for chills, diaphoresis and fever.  HENT:  Negative for congestion, rhinorrhea and sneezing.   Eyes: Negative.   Respiratory:  Positive for cough and shortness of breath. Negative for chest tightness.   Cardiovascular:  Negative for chest pain and leg swelling.  Gastrointestinal:  Negative for abdominal pain, blood in stool, diarrhea, nausea and vomiting.  Genitourinary:  Negative for difficulty urinating, flank pain, frequency and hematuria.  Musculoskeletal:  Negative for arthralgias and back pain.  Skin:  Negative for rash.  Neurological:  Negative for dizziness, speech difficulty, weakness, numbness and headaches.   Physical Exam Updated Vital Signs BP 130/89   Pulse 72   Temp 98.1 F (36.7 C) (Oral)   Resp 13   SpO2 100%   Physical Exam Constitutional:      Appearance: He is well-developed.  HENT:     Head: Normocephalic and atraumatic.  Eyes:     Pupils: Pupils are equal, round, and reactive to light.  Cardiovascular:     Rate and Rhythm: Normal rate and regular rhythm.     Heart sounds: Normal heart sounds.  Pulmonary:     Effort: Pulmonary effort is normal. No respiratory distress.     Breath sounds: Normal breath sounds. No wheezing or rales.  Chest:     Chest wall: No tenderness.  Abdominal:     General: Bowel sounds are normal.     Palpations: Abdomen is soft.     Tenderness: There is no abdominal tenderness. There is no guarding or rebound.  Musculoskeletal:        General: Normal range of motion.     Cervical back: Normal range of motion and neck supple.     Comments: No edema or calf tenderness  Lymphadenopathy:     Cervical: No cervical adenopathy.  Skin:    General: Skin is warm and dry.     Findings: No rash.  Neurological:     Mental Status: He is alert and oriented to person, place, and time.    ED Results / Procedures / Treatments   Labs (all labs ordered are listed, but only abnormal results are displayed) Labs Reviewed  SARS  CORONAVIRUS 2 (TAT 6-24 HRS) - Abnormal; Notable for the following components:      Result Value   SARS Coronavirus 2 POSITIVE (*)    All other components within normal limits  COMPREHENSIVE METABOLIC PANEL - Abnormal; Notable for the following components:   Glucose, Bld 119 (*)    AST 44 (*)    All other components within normal limits  CBC - Abnormal; Notable for the following components:   WBC 3.3 (*)    All other components  within normal limits  D-DIMER, QUANTITATIVE    EKG EKG Interpretation  Date/Time:  Tuesday January 14 2021 07:45:44 EDT Ventricular Rate:  86 PR Interval:  158 QRS Duration: 94 QT Interval:  396 QTC Calculation: 473 R Axis:   88 Text Interpretation: Normal sinus rhythm Right atrial enlargement Borderline ECG since last tracing no significant change Confirmed by Malvin Johns [phone]) on 01/14/2021 12:54:17 PM  Radiology DG Chest 2 View  Result Date: 01/14/2021 CLINICAL DATA:  Shortness breath EXAM: CHEST - 2 VIEW COMPARISON:  None. FINDINGS: The heart size and mediastinal contours are within normal limits. Both lungs are clear. Blunting of the bilateral costophrenic angles possibly due to pleural thickening versus trace bilateral effusions. The visualized skeletal structures are unremarkable. IMPRESSION: No active cardiopulmonary disease. Electronically Signed   By: Yetta Glassman M.D.   On: 01/14/2021 08:27    Procedures Procedures   Medications Ordered in ED Medications - No data to display  ED Course  I have reviewed the triage vital signs and the nursing notes.  Pertinent labs & imaging results that were available during my care of the patient were reviewed by me and considered in my medical decision making (see chart for details).    MDM Rules/Calculators/A&P                           Patient is a 70 year old male who presents with shortness of breath after recent COVID infection.  He has no increased work of breathing.  No hypoxia.  His  chest x-ray is clear without evidence of pneumonia or pulmonary edema.  His D-dimer is negative and he has no other symptoms that would be more suggestive of PE.  He is otherwise well-appearing.  He does not have symptoms that would be otherwise suggestive of ACS.  He does report that he has been having a lot of anxiety recently.  I discussed with him following up with his PCP regarding this.  He was advised to follow-up with his PCP if his symptoms are not improving.  Return cautions were given. Final Clinical Impression(s) / ED Diagnoses Final diagnoses:  Shortness of breath    Rx / DC Orders ED Discharge Orders     None        Malvin Johns, MD 01/14/21 1440    Malvin Johns, MD 01/14/21 1441

## 2021-01-14 NOTE — ED Triage Notes (Signed)
Pt here from home with c/o sob , wife had covid  1 week ago but he has not been tested, no fevers

## 2021-02-03 DIAGNOSIS — H35361 Drusen (degenerative) of macula, right eye: Secondary | ICD-10-CM | POA: Diagnosis not present

## 2021-02-03 DIAGNOSIS — H353111 Nonexudative age-related macular degeneration, right eye, early dry stage: Secondary | ICD-10-CM | POA: Diagnosis not present

## 2021-02-03 DIAGNOSIS — D3131 Benign neoplasm of right choroid: Secondary | ICD-10-CM | POA: Diagnosis not present

## 2021-02-03 DIAGNOSIS — H02055 Trichiasis without entropian left lower eyelid: Secondary | ICD-10-CM | POA: Diagnosis not present

## 2021-02-03 DIAGNOSIS — H02052 Trichiasis without entropian right lower eyelid: Secondary | ICD-10-CM | POA: Diagnosis not present

## 2021-02-03 DIAGNOSIS — H43813 Vitreous degeneration, bilateral: Secondary | ICD-10-CM | POA: Diagnosis not present

## 2021-03-11 DIAGNOSIS — N401 Enlarged prostate with lower urinary tract symptoms: Secondary | ICD-10-CM | POA: Diagnosis not present

## 2021-03-11 DIAGNOSIS — R3912 Poor urinary stream: Secondary | ICD-10-CM | POA: Diagnosis not present

## 2021-03-11 DIAGNOSIS — R350 Frequency of micturition: Secondary | ICD-10-CM | POA: Diagnosis not present

## 2021-03-26 DIAGNOSIS — M9903 Segmental and somatic dysfunction of lumbar region: Secondary | ICD-10-CM | POA: Diagnosis not present

## 2021-03-26 DIAGNOSIS — M9905 Segmental and somatic dysfunction of pelvic region: Secondary | ICD-10-CM | POA: Diagnosis not present

## 2021-03-26 DIAGNOSIS — M9904 Segmental and somatic dysfunction of sacral region: Secondary | ICD-10-CM | POA: Diagnosis not present

## 2021-03-26 DIAGNOSIS — M5136 Other intervertebral disc degeneration, lumbar region: Secondary | ICD-10-CM | POA: Diagnosis not present

## 2021-05-05 DIAGNOSIS — H02055 Trichiasis without entropian left lower eyelid: Secondary | ICD-10-CM | POA: Diagnosis not present

## 2021-05-05 DIAGNOSIS — H02052 Trichiasis without entropian right lower eyelid: Secondary | ICD-10-CM | POA: Diagnosis not present

## 2021-05-12 ENCOUNTER — Emergency Department (HOSPITAL_COMMUNITY): Payer: Medicare HMO

## 2021-05-12 ENCOUNTER — Ambulatory Visit (HOSPITAL_COMMUNITY)
Admission: EM | Disposition: A | Discharge status: Home / Self Care | Payer: Self-pay | Source: Home / Self Care | Attending: Emergency Medicine

## 2021-05-12 ENCOUNTER — Encounter (HOSPITAL_COMMUNITY): Payer: Self-pay | Admitting: Emergency Medicine

## 2021-05-12 ENCOUNTER — Observation Stay (HOSPITAL_COMMUNITY)
Admission: EM | Admit: 2021-05-12 | Discharge: 2021-05-14 | Disposition: A | Discharge status: Home / Self Care | Payer: Medicare HMO | Attending: Cardiovascular Disease | Admitting: Cardiovascular Disease

## 2021-05-12 DIAGNOSIS — U071 COVID-19: Secondary | ICD-10-CM | POA: Diagnosis not present

## 2021-05-12 DIAGNOSIS — I400 Infective myocarditis: Secondary | ICD-10-CM | POA: Diagnosis not present

## 2021-05-12 DIAGNOSIS — R778 Other specified abnormalities of plasma proteins: Secondary | ICD-10-CM

## 2021-05-12 DIAGNOSIS — Z87891 Personal history of nicotine dependence: Secondary | ICD-10-CM | POA: Diagnosis not present

## 2021-05-12 DIAGNOSIS — I514 Myocarditis, unspecified: Secondary | ICD-10-CM | POA: Insufficient documentation

## 2021-05-12 DIAGNOSIS — I214 Non-ST elevation (NSTEMI) myocardial infarction: Secondary | ICD-10-CM | POA: Diagnosis not present

## 2021-05-12 DIAGNOSIS — R079 Chest pain, unspecified: Secondary | ICD-10-CM | POA: Diagnosis not present

## 2021-05-12 DIAGNOSIS — I4729 Other ventricular tachycardia: Secondary | ICD-10-CM | POA: Diagnosis not present

## 2021-05-12 DIAGNOSIS — M25511 Pain in right shoulder: Secondary | ICD-10-CM | POA: Diagnosis present

## 2021-05-12 HISTORY — PX: LEFT HEART CATH AND CORONARY ANGIOGRAPHY: CATH118249

## 2021-05-12 LAB — TROPONIN I (HIGH SENSITIVITY)
Troponin I (High Sensitivity): 1168 ng/L (ref <18)
Troponin I (High Sensitivity): 1134 ng/L (ref <18)

## 2021-05-12 LAB — CBC
HCT: 49.9 % (ref 39.0–52.0)
Hemoglobin: 17.1 g/dL — ABNORMAL HIGH (ref 13.0–17.0)
MCH: 32.4 pg (ref 26.0–34.0)
MCHC: 34.3 g/dL (ref 30.0–36.0)
MCV: 94.7 fL (ref 80.0–100.0)
Platelets: 213 10*3/uL (ref 150–400)
RBC: 5.27 MIL/uL (ref 4.22–5.81)
RDW: 13.2 % (ref 11.5–15.5)
WBC: 8.2 10*3/uL (ref 4.0–10.5)
nRBC: 0 % (ref 0.0–0.2)

## 2021-05-12 LAB — BASIC METABOLIC PANEL
Anion gap: 8 (ref 5–15)
BUN: 17 mg/dL (ref 8–23)
CO2: 26 mmol/L (ref 22–32)
Calcium: 9.5 mg/dL (ref 8.9–10.3)
Chloride: 106 mmol/L (ref 98–111)
Creatinine, Ser: 1.12 mg/dL (ref 0.61–1.24)
GFR, Estimated: >60 mL/min (ref >60)
Glucose, Bld: 109 mg/dL — ABNORMAL HIGH (ref 70–99)
Potassium: 4.4 mmol/L (ref 3.5–5.1)
Sodium: 140 mmol/L (ref 135–145)

## 2021-05-12 LAB — D-DIMER, QUANTITATIVE: D-Dimer, Quant: 0.48 ug/mL-FEU (ref 0.00–0.50)

## 2021-05-12 LAB — RESP PANEL BY RT-PCR (FLU A&B, COVID) ARPGX2
Influenza A by PCR: NEGATIVE
Influenza B by PCR: NEGATIVE
SARS Coronavirus 2 by RT PCR: POSITIVE — AB

## 2021-05-12 SURGERY — LEFT HEART CATH AND CORONARY ANGIOGRAPHY
Anesthesia: LOCAL

## 2021-05-12 MED ORDER — HYDRALAZINE HCL 20 MG/ML IJ SOLN: 10.0000 mg | INTRAMUSCULAR | Status: AC | PRN

## 2021-05-12 MED ORDER — LABETALOL HCL 5 MG/ML IV SOLN: 10.0000 mg | INTRAVENOUS | Status: AC | PRN

## 2021-05-12 MED ORDER — ATORVASTATIN CALCIUM 80 MG PO TABS
80.0000 mg | ORAL_TABLET | Freq: Every day | ORAL | Status: DC
  Administered 2021-05-12: 19:00:00 80 mg via ORAL
  Filled 2021-05-12: qty 1

## 2021-05-12 MED ORDER — VERAPAMIL HCL 2.5 MG/ML IV SOLN
INTRAVENOUS | Status: AC
  Filled 2021-05-12: qty 2

## 2021-05-12 MED ORDER — HEPARIN (PORCINE) IN NACL 1000-0.9 UT/500ML-% IV SOLN
INTRAVENOUS | Status: AC
  Filled 2021-05-12: qty 1000

## 2021-05-12 MED ORDER — MIDAZOLAM HCL 2 MG/2ML IJ SOLN
INTRAMUSCULAR | Status: AC
  Filled 2021-05-12: qty 2

## 2021-05-12 MED ORDER — ASPIRIN 81 MG PO CHEW
81.0000 mg | CHEWABLE_TABLET | Freq: Once | ORAL | Status: DC
  Filled 2021-05-12: qty 1

## 2021-05-12 MED ORDER — SODIUM CHLORIDE 0.9% FLUSH
3.0000 mL | Freq: Two times a day (BID) | INTRAVENOUS | Status: DC
  Administered 2021-05-12 – 2021-05-14 (×4): 3 mL via INTRAVENOUS

## 2021-05-12 MED ORDER — SODIUM CHLORIDE 0.9% FLUSH: 3.0000 mL | INTRAVENOUS | Status: DC | PRN

## 2021-05-12 MED ORDER — ONDANSETRON HCL 4 MG/2ML IJ SOLN: 4.0000 mg | Freq: Four times a day (QID) | INTRAMUSCULAR | Status: DC | PRN

## 2021-05-12 MED ORDER — ACETAMINOPHEN 325 MG PO TABS
650.0000 mg | ORAL_TABLET | ORAL | Status: DC | PRN
  Administered 2021-05-13: 01:00:00 650 mg via ORAL
  Filled 2021-05-12: qty 2

## 2021-05-12 MED ORDER — HEPARIN (PORCINE) 25000 UT/250ML-% IV SOLN
950.0000 [IU]/h | INTRAVENOUS | Status: DC
  Administered 2021-05-12: 16:00:00 950 [IU]/h via INTRAVENOUS
  Filled 2021-05-12: qty 250

## 2021-05-12 MED ORDER — ASPIRIN 81 MG PO CHEW
81.0000 mg | CHEWABLE_TABLET | Freq: Every day | ORAL | Status: DC
  Administered 2021-05-13 – 2021-05-14 (×2): 81 mg via ORAL
  Filled 2021-05-12 (×2): qty 1

## 2021-05-12 MED ORDER — HEPARIN (PORCINE) IN NACL 2000-0.9 UNIT/L-% IV SOLN
INTRAVENOUS | Status: DC | PRN
  Administered 2021-05-12: 1000 mL

## 2021-05-12 MED ORDER — SODIUM CHLORIDE 0.9 % IV SOLN: 250.0000 mL | INTRAVENOUS | Status: DC | PRN

## 2021-05-12 MED ORDER — HEPARIN SODIUM (PORCINE) 1000 UNIT/ML IJ SOLN
INTRAMUSCULAR | Status: DC | PRN
  Administered 2021-05-12: 4000 [IU] via INTRAVENOUS

## 2021-05-12 MED ORDER — HEPARIN (PORCINE) IN NACL 1000-0.9 UT/500ML-% IV SOLN
INTRAVENOUS | Status: DC | PRN
  Administered 2021-05-12: 500 mL

## 2021-05-12 MED ORDER — LIDOCAINE HCL (PF) 1 % IJ SOLN
INTRAMUSCULAR | Status: DC | PRN
  Administered 2021-05-12: 2 mL via INTRADERMAL

## 2021-05-12 MED ORDER — MIDAZOLAM HCL 2 MG/2ML IJ SOLN
INTRAMUSCULAR | Status: DC | PRN
  Administered 2021-05-12: 2 mg via INTRAVENOUS

## 2021-05-12 MED ORDER — HEPARIN BOLUS VIA INFUSION
4000.0000 [IU] | Freq: Once | INTRAVENOUS | Status: AC
  Administered 2021-05-12: 16:00:00 4000 [IU] via INTRAVENOUS
  Filled 2021-05-12: qty 4000

## 2021-05-12 MED ORDER — SODIUM CHLORIDE 0.9 % WEIGHT BASED INFUSION: 1.0000 mL/kg/h | INTRAVENOUS | Status: DC

## 2021-05-12 MED ORDER — SODIUM CHLORIDE 0.9 % IV SOLN: INTRAVENOUS | Status: AC

## 2021-05-12 MED ORDER — NITROGLYCERIN 0.4 MG SL SUBL: 0.4000 mg | SUBLINGUAL_TABLET | SUBLINGUAL | Status: DC | PRN

## 2021-05-12 MED ORDER — MORPHINE SULFATE (PF) 2 MG/ML IV SOLN
2.0000 mg | Freq: Once | INTRAVENOUS | Status: AC
  Administered 2021-05-12: 15:00:00 2 mg via INTRAVENOUS
  Filled 2021-05-12: qty 1

## 2021-05-12 MED ORDER — IOHEXOL 350 MG/ML SOLN
INTRAVENOUS | Status: DC | PRN
  Administered 2021-05-12: 17:00:00 75 mL

## 2021-05-12 MED ORDER — LIDOCAINE HCL (PF) 1 % IJ SOLN
INTRAMUSCULAR | Status: AC
  Filled 2021-05-12: qty 30

## 2021-05-12 MED ORDER — FENTANYL CITRATE (PF) 100 MCG/2ML IJ SOLN
INTRAMUSCULAR | Status: AC
  Filled 2021-05-12: qty 2

## 2021-05-12 MED ORDER — SODIUM CHLORIDE 0.9% FLUSH
3.0000 mL | Freq: Two times a day (BID) | INTRAVENOUS | Status: DC
  Administered 2021-05-13 – 2021-05-14 (×2): 3 mL via INTRAVENOUS

## 2021-05-12 MED ORDER — SODIUM CHLORIDE 0.9 % WEIGHT BASED INFUSION: 3.0000 mL/kg/h | INTRAVENOUS | Status: DC

## 2021-05-12 MED ORDER — METOPROLOL TARTRATE 12.5 MG HALF TABLET
12.5000 mg | ORAL_TABLET | Freq: Two times a day (BID) | ORAL | Status: DC
  Administered 2021-05-12 – 2021-05-13 (×2): 12.5 mg via ORAL
  Filled 2021-05-12 (×3): qty 1

## 2021-05-12 MED ORDER — HEPARIN SODIUM (PORCINE) 1000 UNIT/ML IJ SOLN
INTRAMUSCULAR | Status: AC
  Filled 2021-05-12: qty 10

## 2021-05-12 MED ORDER — VERAPAMIL HCL 2.5 MG/ML IV SOLN
INTRAVENOUS | Status: DC | PRN
  Administered 2021-05-12: 17:00:00 10 mL via INTRA_ARTERIAL

## 2021-05-12 SURGICAL SUPPLY — 12 items
CATH INFINITI 5 FR JL3.5 (CATHETERS) ×2 IMPLANT
CATH INFINITI 5FR ANG PIGTAIL (CATHETERS) ×2 IMPLANT
CATH INFINITI JR4 5F (CATHETERS) ×2 IMPLANT
DEVICE RAD COMP TR BAND LRG (VASCULAR PRODUCTS) ×2 IMPLANT
GLIDESHEATH SLEND SS 6F .021 (SHEATH) ×2 IMPLANT
GUIDEWIRE INQWIRE 1.5J.035X260 (WIRE) IMPLANT
INQWIRE 1.5J .035X260CM (WIRE) ×3
KIT HEART LEFT (KITS) ×3 IMPLANT
PACK CARDIAC CATHETERIZATION (CUSTOM PROCEDURE TRAY) ×3 IMPLANT
SYR MEDRAD MARK 7 150ML (SYRINGE) ×3 IMPLANT
TRANSDUCER W/STOPCOCK (MISCELLANEOUS) ×3 IMPLANT
TUBING CIL FLEX 10 FLL-RA (TUBING) ×3 IMPLANT

## 2021-05-12 NOTE — ED Provider Notes (Signed)
[facility] EMERGENCY DEPARTMENT Provider Note   CSN: [id number] Arrival date & time: 05/12/21  1107     History Chief Complaint  Patient presents with   Arm Pain    Norman Pearson is a 70 y.o. male with no significant past medical history presents today with achy pain in neck, upper chest, bilateral arms.  Patient reports that he was doing some heavy lifting a few days ago when this aching feeling began, however he reports it feels different than normal aches and pains, he was concerned that it may be related to angina.  Patient also denies history of hypertension, but reports that his blood pressure was quite elevated this morning up to systolic of 740.  Patient denies diaphoresis, nausea, vomiting, crushing chest pain, shortness of breath.  Patient denies previous history of ACS, family history of ACS, history of diabetes.  He does endorse remote history of tobacco use.  Patient does not Pearson a cardiologist.   Arm Pain      History reviewed. No pertinent past medical history.  Patient Active Problem List   Diagnosis Date Noted   Acute urinary retention 04/27/2011   Anemia associated with acute blood loss 04/15/2011   Hypocalcemia 04/15/2011   Unstable burst fracture of first lumbar vertebra ([place]) 04/14/2011   Accidental fall from ladder 04/14/2011   Closed fracture of left fibula 04/14/2011   Laceration of fourth finger of left hand 04/14/2011   Closed fracture of left distal tibia 04/14/2011    Past Surgical History:  Procedure Laterality Date   COLONOSCOPY  2016   Dr Starr Sinclair GI. Michela Pitcher he was clean   EXTERNAL FIXATION LEG  04/14/2011   Procedure: EXTERNAL FIXATION LEG;  Surgeon: Wylene Simmer, MD;  Location: [facility];  Service: Orthopedics;  Laterality: Left;  External fixation left lower leg   I & D EXTREMITY  04/14/2011   Procedure: IRRIGATION AND DEBRIDEMENT EXTREMITY;  Surgeon: Wylene Simmer, MD;  Location: [place] [facility];  Service: Orthopedics;  Laterality:  Left;  Repair of left hand laceration   ORIF TIBIA FRACTURE  04/23/2011   Procedure: OPEN REDUCTION INTERNAL FIXATION (ORIF) TIBIA FRACTURE;  Surgeon: Wylene Simmer, MD;  Location: [facility];  Service: Orthopedics;  Laterality: Left;  Removal of External Fixator, Open reduction internal fixation Left Tibial Pilon Fracture    OTHER SURGICAL HISTORY     tendon surgery       Family History  Problem Relation Age of Onset   Colon cancer Neg Hx    Esophageal cancer Neg Hx    Pancreatic cancer Neg Hx    Liver disease Neg Hx     Social History   Tobacco Use   Smoking status: Former    Types: Cigarettes    Quit date: 04/13/1974    Years since quitting: 47.1   Smokeless tobacco: Former   Tobacco comments:    quit over 35 years ago   Vaping Use   Vaping Use: Never used  Substance Use Topics   Alcohol use: Yes    Alcohol/week: 3.0 standard drinks    Types: 3 Standard drinks or equivalent per week   Drug use: No    Home Medications Prior to Admission medications   Medication Sig Start Date End Date Taking? Authorizing Provider  AMBULATORY NON FORMULARY MEDICATION FD guard take 2 capsules twice daily 30-60 minutes before meals  Lot #[id number] Exp 05/2021 x 7 boxes 08/21/20   Cirigliano, Vito V, DO  HYDROcodone-acetaminophen (NORCO/VICODIN) 5-325 MG tablet  prn    [provider]  loratadine (CLARITIN) 10 MG tablet Take 10 mg by mouth as needed for allergies (Usually in May).    [provider]  LORazepam (ATIVAN) 0.5 MG tablet Take by mouth.    [provider]  MILK THISTLE PO Take 1 tablet by mouth daily.    [provider]  Saw Palmetto, Serenoa repens, (SAW PALMETTO PO) Take 1 tablet by mouth daily.    [provider]    Allergies    Other and Fentanyl  Review of Systems   Review of Systems  Respiratory:  Positive for chest tightness.   Musculoskeletal:  Positive for myalgias.  All other systems reviewed and are negative.  Physical  Exam Updated Vital Signs BP (!) 156/102 (BP Location: Right Arm)    Pulse 87    Temp 98.1 F (36.7 C) (Oral)    Resp 16    SpO2 100%   Physical Exam Vitals and nursing note reviewed.  Constitutional:      General: He is not in acute distress.    Appearance: Normal appearance.  HENT:     Head: Normocephalic and atraumatic.  Eyes:     General:        Right eye: No discharge.        Left eye: No discharge.  Cardiovascular:     Rate and Rhythm: Normal rate and regular rhythm.     Heart sounds: No murmur heard.   No friction rub. No gallop.     Comments: No tenderness to palpation of the chest wall. Pulmonary:     Effort: Pulmonary effort is normal.     Breath sounds: Normal breath sounds.  Abdominal:     General: Bowel sounds are normal.     Palpations: Abdomen is soft.  Musculoskeletal:     Comments: No tenderness to palpation replicated midline C-spine, paraspinous muscle C-spine, shoulders, bilateral arms.  Intact strength 5 out of 5 bilateral upper extremities.  Skin:    General: Skin is warm and dry.     Capillary Refill: Capillary refill takes less than 2 seconds.  Neurological:     Mental Status: He is alert and oriented to person, place, and time.  Psychiatric:        Mood and Affect: Mood normal.        Behavior: Behavior normal.     ED Results / Procedures / Treatments   Labs (all labs ordered are listed, but only abnormal results are displayed) Labs Reviewed  CBC - Abnormal; Notable for the following components:      Result Value   Hemoglobin 17.1 (*)    All other components within normal limits  BASIC METABOLIC PANEL  TROPONIN I (HIGH SENSITIVITY)    EKG None  Radiology DG Chest 2 View  Result Date: 05/12/2021 CLINICAL DATA:  chest pain EXAM: CHEST - 2 VIEW COMPARISON:  01/14/2021. FINDINGS: No consolidation. No visible pleural effusions or pneumothorax. Biapical pleuroparenchymal scarring. Similar blunting of the costophrenic sulci, possibly related  to trace pleural effusions versus pleural thickening. Partially imaged thoracolumbar fusion. IMPRESSION: No evidence of acute cardiopulmonary disease. Electronically Signed   By: Margaretha Sheffield M.D.   On: 05/12/2021 11:59    Procedures Procedures   Medications Ordered in ED Medications - No data to display  ED Course  I have reviewed the triage vital signs and the nursing notes.  Pertinent labs & imaging results that were available during my care of the patient were  reviewed by me and considered in my medical decision making (Pearson chart for details).    MDM Rules/Calculators/A&P                         I discussed this case with my attending physician who cosigned this note including patient's presenting symptoms, physical exam, and planned diagnostics and interventions. Attending physician stated agreement with plan or made changes to plan which were implemented.   Attending physician assessed patient at bedside.  Older male with no previous history of ACS, risk factors include tobacco use, age.  Patient with report of arm pain, chest pain after lifting, however he reports that arm pain feels different than normal aches and pains.  Patient reports he feels "sick feeling". Reports some arm aching, pain radiation into chest, without nausea, vomiting, diaphoresis.  We will obtain CBC, BMP, troponin, EKG, chest x-ray as patient has high risk of possible ACS given age.  Initial troponin 1168.  Consult placed to cardiology.  Initial EKG missing at this time, we will repeat.  Spoke with cardiology who will come and evaluate patient at this time. Admitted to cardiology at this time for catheterization. We will begin morphine, nitroglycerin, heparin at this time.  Final Clinical Impression(s) / ED Diagnoses Final diagnoses:  None    Rx / DC Orders ED Discharge Orders     None        Dorien Chihuahua 05/12/21 1538    Regan Lemming, MD 05/12/21 1558

## 2021-05-12 NOTE — H&P (Addendum)
Cardiology Admission History and Physical:   Patient ID: Norman Pearson MRN: [id number]; DOB: [birth year]   Admission date: 05/12/2021  PCP:  Prince Solian, MD   [facility] Providers Cardiologist:  new - Dr. Gwenlyn Found    Chief Complaint:  Neck pain radiating down bilateral arm  Patient Profile:   Norman Pearson is a 70 y.o. male with recent COVID infection in Aug 2022 but no other significant illness who is being seen 05/12/2021 for the evaluation of NSTEMI.  History of Present Illness:   Norman Pearson is a 70 year old male with no past medical history other than recent COVID infection in August 2022 presented with 2-day onset of intermittent neck pain radiating down the bilateral arm.  He denies any chest pain.  He is on no medication at home.  He denies any prior diagnosis of hypertension, hyperlipidemia or diabetes.  He reportedly was diagnosed with mitral valve prolapse when he was young.  He had a remote history of smoking for 3 years however quit about 50 years ago.  He drinks 1 ounce of liquor 2-3 times a week.  He had to bury his granddaughter's dog Saturday night.  Afterward, he had neck pain radiating down the bilateral arm.  He did not have any chest pain.  By Sunday he was feeling fine.  He was able to walk a mile on the treadmill Sunday afternoon without any issue.  Sunday night after dinner, the neck pain and arm pain came back.  By the time he went to bed he still had a slight discomfort.  He woke up Monday morning continue to feel the neck pain and arm pain.  He checked his blood pressure at home which was in the 160s.  This was very unusual for him. This prompted him to seek urgent medical attention at [facility], ED.  Initial EKG showed no obvious ST-T wave changes.  EKG shows no acute changes.  Chest x-ray normal.  He has had intermittent shortness of breath for the past year however there is no noticeable change recently.  First hs troponin came back elevated at 1168.   Cardiology service consulted for NSTEMI.   Past Medical History:  Diagnosis Date   COVID-19 12/2020    Past Surgical History:  Procedure Laterality Date   COLONOSCOPY  2016   Dr Starr Sinclair GI. Michela Pitcher he was clean   EXTERNAL FIXATION LEG  04/14/2011   Procedure: EXTERNAL FIXATION LEG;  Surgeon: Wylene Simmer, MD;  Location: [facility];  Service: Orthopedics;  Laterality: Left;  External fixation left lower leg   I & D EXTREMITY  04/14/2011   Procedure: IRRIGATION AND DEBRIDEMENT EXTREMITY;  Surgeon: Wylene Simmer, MD;  Location: [facility];  Service: Orthopedics;  Laterality: Left;  Repair of left hand laceration   ORIF TIBIA FRACTURE  04/23/2011   Procedure: OPEN REDUCTION INTERNAL FIXATION (ORIF) TIBIA FRACTURE;  Surgeon: Wylene Simmer, MD;  Location: [facility];  Service: Orthopedics;  Laterality: Left;  Removal of External Fixator, Open reduction internal fixation Left Tibial Pilon Fracture    OTHER SURGICAL HISTORY     tendon surgery     Medications Prior to Admission: Prior to Admission medications   Medication Sig Start Date End Date Taking? Authorizing Provider  AMBULATORY NON FORMULARY MEDICATION FD guard take 2 capsules twice daily 30-60 minutes before meals  Lot #[id number] Exp 05/2021 x 7 boxes 08/21/20   Cirigliano, Vito V, DO  HYDROcodone-acetaminophen (NORCO/VICODIN) 5-325 MG tablet prn    [provider]  loratadine (CLARITIN) 10 MG tablet Take 10 mg by mouth as needed for allergies (Usually in May).    [provider]  LORazepam (ATIVAN) 0.5 MG tablet Take by mouth.    [provider]  MILK THISTLE PO Take 1 tablet by mouth daily.    [provider]  Saw Palmetto, Serenoa repens, (SAW PALMETTO PO) Take 1 tablet by mouth daily.    [provider]     Allergies:    Allergies  Allergen Reactions   Other Itching    Grass pollen   Fentanyl     "bad taste"    Social History:   Social History   Socioeconomic History   Marital status:  Married    Spouse name: Not on file   Number of children: 1   Years of education: Not on file   Highest education level: Not on file  Occupational History   Not on file  Tobacco Use   Smoking status: Former    Types: Cigarettes    Quit date: 04/13/1974    Years since quitting: 47.1   Smokeless tobacco: Former   Tobacco comments:    quit over 50 years ago. Smoked 1 ppd for 3 years when young  Vaping Use   Vaping Use: Never used  Substance and Sexual Activity   Alcohol use: Yes    Alcohol/week: 3.0 standard drinks    Types: 3 Standard drinks or equivalent per week    Comment: 1 oz of liquor 2-3 times a week   Drug use: No    Comment: remote use of cocaine when young   Sexual activity: Not on file  Other Topics Concern   Not on file  Social History Narrative   Not on file   Social Determinants of Health   Financial Resource Strain: Not on file  Food Insecurity: Not on file  Transportation Needs: Not on file  Physical Activity: Not on file  Stress: Not on file  Social Connections: Not on file  Intimate Partner Violence: Not on file    Family History:   The patient's family history includes Dementia in his mother; Heart attack in his paternal grandfather; Heart failure in his father. There is no history of Colon cancer, Esophageal cancer, Pancreatic cancer, or Liver disease.    ROS:  Please see the history of present illness.  All other ROS reviewed and negative.     Physical Exam/Data:   Vitals:   05/12/21 1133 05/12/21 1430 05/12/21 1500  BP: (!) 156/102 (!) 160/97 (!) 155/91  Pulse: 87 81 88  Resp: 16 15 17   Temp: 98.1 F (36.7 C)    TempSrc: Oral    SpO2: 100% 100% 99%   No intake or output data in the 24 hours ending 05/12/21 1523 Last 3 Weights 12/16/2020 08/21/2020 07/30/2020  Weight (lbs) 171 lb 4 oz 170 lb 170 lb 4 oz  Weight (kg) 77.678 kg 77.111 kg 77.225 kg     There is no height or weight on file to calculate BMI.  General:  Well nourished, well  developed, in no acute distress HEENT: normal Neck: no JVD Vascular: No carotid bruits; Distal pulses 2+ bilaterally   Cardiac:  normal S1, S2; RRR; no murmur  Lungs:  clear to auscultation bilaterally, no wheezing, rhonchi or rales  Abd: soft, nontender, no hepatomegaly  Ext: no edema Musculoskeletal:  No deformities, BUE and BLE strength normal and equal Skin: warm and dry  Neuro:  CNs 2-12  intact, no focal abnormalities noted Psych:  Normal affect    EKG:  The ECG that was done and was personally reviewed and demonstrates normal sinus rhythm, no significant ST-T wave changes  Relevant CV Studies:  N/A  Laboratory Data:  High Sensitivity Troponin:   Recent Labs  Lab 05/12/21 1142  TROPONINIHS 1,168*      Chemistry Recent Labs  Lab 05/12/21 1142  NA 140  K 4.4  CL 106  CO2 26  GLUCOSE 109*  BUN 17  CREATININE 1.12  CALCIUM 9.5  GFRNONAA >60  ANIONGAP 8    No results for input(s): PROT, ALBUMIN, AST, ALT, ALKPHOS, BILITOT in the last 168 hours. Lipids No results for input(s): CHOL, TRIG, HDL, LABVLDL, LDLCALC, CHOLHDL in the last 168 hours. Hematology Recent Labs  Lab 05/12/21 1142  WBC 8.2  RBC 5.27  HGB 17.1*  HCT 49.9  MCV 94.7  MCH 32.4  MCHC 34.3  RDW 13.2  PLT 213   Thyroid No results for input(s): TSH, FREET4 in the last 168 hours. BNPNo results for input(s): BNP, PROBNP in the last 168 hours.  DDimer No results for input(s): DDIMER in the last 168 hours.   Radiology/Studies:  DG Chest 2 View  Result Date: 05/12/2021 CLINICAL DATA:  chest pain EXAM: CHEST - 2 VIEW COMPARISON:  01/14/2021. FINDINGS: No consolidation. No visible pleural effusions or pneumothorax. Biapical pleuroparenchymal scarring. Similar blunting of the costophrenic sulci, possibly related to trace pleural effusions versus pleural thickening. Partially imaged thoracolumbar fusion. IMPRESSION: No evidence of acute cardiopulmonary disease. Electronically Signed   By: Margaretha Sheffield M.D.   On: 05/12/2021 11:59     Assessment and Plan:   NSTEMI -First troponin 1168.  Denies any chest pain, anginal symptom was neck pain radiating down the bilateral arm.  Symptoms started 2 nights ago after he buried his granddaughter's dog -Symptoms restarted around 7 PM last night, improved by the time he went to sleep however returned this morning when he woke up. -obtain echocardiogram.  IV heparin.  Sublingual nitroglycerin and morphine. - daily ASA, lipitor and 12.5mg  BID metoprolol - will discuss with MD to proceed with cardiac catheterization.  Risk and the benefit of the procedure has been explained to the patient.  - Risk and benefit of procedure explained to the patient who display clear understanding and agree to proceed. Discussed with patient possible procedural risk include bleeding, vascular injury, renal injury, arrythmia, MI, stroke and loss of limb or life.    Risk Assessment/Risk Scores:    TIMI Risk Score for Unstable Angina or Non-ST Elevation MI:   The patient's TIMI risk score is 3, which indicates a 13% risk of all cause mortality, new or recurrent myocardial infarction or need for urgent revascularization in the next 14 days.       Severity of Illness: The appropriate patient status for this patient is Observation. Observation status is judged to be reasonable and necessary in order to provide the required intensity of service to ensure the patient's safety. The patient's presenting symptoms, physical exam findings, and initial radiographic and laboratory data in the context of their chronic comorbidities is felt to place them at high risk for further clinical deterioration.   For questions or updates, please contact [facility] [place] Please consult [email or web address] for contact info under    Signed, Almyra Deforest, [place]  05/12/2021 3:23 PM    Agree with note by Almyra Deforest PA-C  Mr. Lesinski is being seen for non-STEMI.  He  has no cardiac risk factors.  He  developed neck back and arm pain on Saturday which persisted overnight into Sunday.  He really denies chest pain or shortness of breath.  His enzymes were positive thousand.  His EKG showed no acute changes.  He continues to have 2-3 over 10 discomfort.  He has no symptoms of COVID.  I believe he would best be served by coronary angiography today. The patient understands that risks included but are not limited to stroke (1 in 1000), death (1 in 21), kidney failure [usually temporary] (1 in 500), bleeding (1 in 200), allergic reaction [possibly serious] (1 in 200).  The patient understands and agrees to proceed    Lorretta Harp, M.D., [place], [facility], [place], [place] [address] [place], [place]  [address]  [phone] 05/12/2021 3:39 PM

## 2021-05-12 NOTE — Progress Notes (Signed)
Pt admitted to [facility] from cath lab AxOx4, VS wnL and as per flow. Pt oriented to 6E processes. (R) TR band C/D/I. Informed by cath lab after arrival that Pt is (+) for Covid. Will initiate proper airborne precautions. All questions and concerns addressed. Call bell placed within reach, will continue to monitor and maintain safety.

## 2021-05-12 NOTE — ED Provider Notes (Signed)
**Note Norman-Identified via Obfuscation** Emergency Medicine Provider Triage Evaluation Note  DELDRICK Pearson , a 70 y.o. male  was evaluated in triage.  Pt complains of left arm / neck pain on and off since two days ago. Patient endorses episode of heavy lifting but now intermittent pain since then. Denies nausea, vomiting, diaphoresis. No hx ACS, DM. Remote hx tobacco use.  Review of Systems  Positive: Left arm / neck pain Negative: Chest pain, shortness of breath  Physical Exam  BP (!) 156/102 (BP Location: Right Arm)    Pulse 87    Temp 98.1 F (36.7 C) (Oral)    Resp 16    SpO2 100%  Gen:   Awake, no distress   Resp:  Normal effort  MSK:   Moves extremities without difficulty  Other:  No ttp left arm, neck  Medical Decision Making  Medically screening exam initiated at 11:43 AM.  Appropriate orders placed.  Norman Pearson was informed that the remainder of the evaluation will be completed by another provider, this initial triage assessment does not replace that evaluation, and the importance of remaining in the ED until their evaluation is complete.  Patient concerned about anginal sx   Norman Pickler, PA-C 05/12/21 1145    Norman Lemming, MD 05/12/21 1214

## 2021-05-12 NOTE — Interval H&P Note (Signed)
History and Physical Interval Note:  05/12/2021 4:08 PM  Norman Pearson  has presented today for surgery, with the diagnosis of nstemi.  The various methods of treatment have been discussed with the patient and family. After consideration of risks, benefits and other options for treatment, the patient has consented to  Procedure(s): LEFT HEART CATH AND CORONARY ANGIOGRAPHY (N/A) as a surgical intervention.  The patient's history has been reviewed, patient examined, no change in status, stable for surgery.  I have reviewed the patient's chart and labs.  Questions were answered to the patient's satisfaction.    Cath Lab Visit (complete for each Cath Lab visit)  Clinical Evaluation Leading to the Procedure:   ACS: Yes.    Non-ACS:    Anginal Classification: CCS IV  Anti-ischemic medical therapy: No Therapy  Non-Invasive Test Results: No non-invasive testing performed  Prior CABG: No previous CABG        Lauree Chandler

## 2021-05-12 NOTE — Progress Notes (Signed)
ANTICOAGULATION CONSULT NOTE - Initial Consult  Pharmacy Consult for Heparin Indication: chest pain/ACS  Allergies  Allergen Reactions   Other Itching    Grass pollen   Fentanyl     "bad taste"    Patient Measurements:   Heparin Dosing Weight: 79.5 kg  Vital Signs: Temp: 98.1 F (36.7 C) (12/19 1133) Temp Source: Oral (12/19 1133) BP: 160/97 (12/19 1430) Pulse Rate: 81 (12/19 1430)  Labs: Recent Labs    05/12/21 1142  HGB 17.1*  HCT 49.9  PLT 213  CREATININE 1.12  TROPONINIHS 1,168*     CrCl cannot be calculated (Unknown ideal weight.).   Medical History: History reviewed. No pertinent past medical history.  Medications:  PRN: nitroGLYCERIN  Assessment: 20 yom with no significant PMH. Patient is presenting with achy pain in neck, upper chest and bilateral arms. Heparin per pharmacy consult placed for chest pain/ACS.  Patient is not on anticoagulation prior to arrival.  Hgb 17.2;plt 213  Goal of Therapy:  Heparin level 0.3-0.7 units/ml Monitor platelets by anticoagulation protocol: Yes   Plan:  Give 4000 units bolus x 1 Start heparin infusion at 950 units/hr Check anti-Xa level in 8 hours and daily while on heparin Continue to monitor H&H and platelets  Debbora Presto PharmD Candidate

## 2021-05-12 NOTE — ED Triage Notes (Signed)
Patient here with complaint of neck, arm, and shoulder pain after lifting a heavy object two days ago. Patient alert, oriented, ambulatory, and in no apparent distress at this time.

## 2021-05-13 ENCOUNTER — Observation Stay (HOSPITAL_BASED_OUTPATIENT_CLINIC_OR_DEPARTMENT_OTHER): Payer: Medicare HMO

## 2021-05-13 ENCOUNTER — Observation Stay (HOSPITAL_COMMUNITY): Payer: Medicare HMO

## 2021-05-13 ENCOUNTER — Encounter (HOSPITAL_COMMUNITY): Payer: Self-pay | Admitting: Cardiovascular Disease

## 2021-05-13 DIAGNOSIS — Z87891 Personal history of nicotine dependence: Secondary | ICD-10-CM | POA: Diagnosis not present

## 2021-05-13 DIAGNOSIS — U071 COVID-19: Secondary | ICD-10-CM | POA: Diagnosis not present

## 2021-05-13 DIAGNOSIS — I514 Myocarditis, unspecified: Secondary | ICD-10-CM | POA: Diagnosis not present

## 2021-05-13 DIAGNOSIS — I4729 Other ventricular tachycardia: Secondary | ICD-10-CM | POA: Diagnosis not present

## 2021-05-13 DIAGNOSIS — I472 Ventricular tachycardia, unspecified: Secondary | ICD-10-CM | POA: Diagnosis not present

## 2021-05-13 DIAGNOSIS — R079 Chest pain, unspecified: Secondary | ICD-10-CM | POA: Diagnosis not present

## 2021-05-13 DIAGNOSIS — I400 Infective myocarditis: Secondary | ICD-10-CM | POA: Diagnosis not present

## 2021-05-13 DIAGNOSIS — I214 Non-ST elevation (NSTEMI) myocardial infarction: Secondary | ICD-10-CM | POA: Diagnosis not present

## 2021-05-13 LAB — BASIC METABOLIC PANEL
Anion gap: 9 (ref 5–15)
BUN: 14 mg/dL (ref 8–23)
CO2: 21 mmol/L — ABNORMAL LOW (ref 22–32)
Calcium: 8.8 mg/dL — ABNORMAL LOW (ref 8.9–10.3)
Chloride: 108 mmol/L (ref 98–111)
Creatinine, Ser: 1.03 mg/dL (ref 0.61–1.24)
GFR, Estimated: >60 mL/min (ref >60)
Glucose, Bld: 114 mg/dL — ABNORMAL HIGH (ref 70–99)
Potassium: 3.7 mmol/L (ref 3.5–5.1)
Sodium: 138 mmol/L (ref 135–145)

## 2021-05-13 LAB — LIPID PANEL
Cholesterol: 144 mg/dL (ref 0–200)
HDL: 53 mg/dL (ref >40)
LDL Cholesterol: 80 mg/dL (ref 0–99)
Total CHOL/HDL Ratio: 2.7 RATIO
Triglycerides: 57 mg/dL (ref <150)
VLDL: 11 mg/dL (ref 0–40)

## 2021-05-13 LAB — ECHOCARDIOGRAM COMPLETE
Area-P 1/2: 2.58 cm2
Calc EF: 67.6 %
S' Lateral: 2.5 cm
Single Plane A2C EF: 67.8 %
Single Plane A4C EF: 71 %
Weight: 2539.7 oz

## 2021-05-13 LAB — HEMOGLOBIN A1C
Hgb A1c MFr Bld: 5.2 % (ref 4.8–5.6)
Mean Plasma Glucose: 102.54 mg/dL

## 2021-05-13 MED ORDER — METOPROLOL TARTRATE 12.5 MG HALF TABLET
12.5000 mg | ORAL_TABLET | Freq: Once | ORAL | Status: AC
Start: 2021-05-13 — End: 2021-05-13
  Administered 2021-05-13: 11:00:00 12.5 mg via ORAL
  Filled 2021-05-13: qty 1

## 2021-05-13 MED ORDER — TRAZODONE HCL 50 MG PO TABS
50.0000 mg | ORAL_TABLET | Freq: Once | ORAL | Status: DC
  Filled 2021-05-13: qty 1

## 2021-05-13 MED ORDER — GADOBUTROL 1 MMOL/ML IV SOLN
10.0000 mL | Freq: Once | INTRAVENOUS | Status: AC | PRN
  Administered 2021-05-13: 14:00:00 10 mL via INTRAVENOUS

## 2021-05-13 MED ORDER — METOPROLOL TARTRATE 25 MG PO TABS
25.0000 mg | ORAL_TABLET | Freq: Two times a day (BID) | ORAL | Status: DC
  Administered 2021-05-13 – 2021-05-14 (×2): 25 mg via ORAL
  Filled 2021-05-13 (×2): qty 1

## 2021-05-13 MED ORDER — ATORVASTATIN CALCIUM 10 MG PO TABS
20.0000 mg | ORAL_TABLET | Freq: Every day | ORAL | Status: DC
  Filled 2021-05-13: qty 2

## 2021-05-13 MED ORDER — POTASSIUM CHLORIDE CRYS ER 20 MEQ PO TBCR
40.0000 meq | EXTENDED_RELEASE_TABLET | Freq: Once | ORAL | Status: AC
  Administered 2021-05-13: 11:00:00 40 meq via ORAL
  Filled 2021-05-13: qty 2

## 2021-05-13 MED FILL — Fentanyl Citrate Preservative Free (PF) Inj 100 MCG/2ML: INTRAMUSCULAR | Qty: 2 | Status: AC

## 2021-05-13 NOTE — Consult Note (Addendum)
Cardiology Consultation:   Patient ID: BESSIE LIVINGOOD MRN: [id number]; DOB: [birth year]  Admit date: 05/12/2021 Date of Consult: 05/13/2021  PCP:  Prince Solian, MD   [facility] Providers Cardiologist:  None        Patient Profile:   RASHIED CORALLO is a 70 y.o. male with no significant PMHx outside of COVID illness back in Aug 2022  who is being seen 05/13/2021 for the evaluation of NSVT at the request of Dr. Gwenlyn Found.  History of Present Illness:   Mr. Millett came to [facility] yesterday with neck pain/L arm pain of a couple days duration, his HS Trop noted to be abnormal and admitted to cardiology service with NSTEMI diagnosis.  He underwent cath noting no CAD, preserved LVEF, TTE also noted normal LVEF and no WMA. 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  Outside of this very peak exercise times, he has no cardiac awareness of any kind.   No near syncope or syncope history   Past Medical History:  Diagnosis Date   COVID-19 12/2020    Past Surgical History:  Procedure Laterality Date   COLONOSCOPY  2016   Dr Starr Sinclair GI. Michela Pitcher he was clean   EXTERNAL FIXATION LEG  04/14/2011   Procedure: EXTERNAL FIXATION LEG;  Surgeon: Wylene Simmer, MD;  Location: [place];  Service: Orthopedics;  Laterality: Left;  External fixation left lower leg   I & D EXTREMITY  04/14/2011   Procedure: IRRIGATION AND DEBRIDEMENT EXTREMITY;  Surgeon: Wylene Simmer, MD;  Location: [place];  Service: Orthopedics;  Laterality: Left;   Repair of left hand laceration   LEFT HEART CATH AND CORONARY ANGIOGRAPHY N/A 05/12/2021   Procedure: LEFT HEART CATH AND CORONARY ANGIOGRAPHY;  Surgeon: Burnell Blanks, MD;  Location: [place] [facility];  Service: Cardiovascular;  Laterality: N/A;   ORIF TIBIA FRACTURE  04/23/2011   Procedure: OPEN REDUCTION INTERNAL FIXATION (ORIF) TIBIA FRACTURE;  Surgeon: Wylene Simmer, MD;  Location: [place];  Service: Orthopedics;  Laterality: Left;  Removal of External Fixator, Open reduction internal fixation Left Tibial Pilon Fracture    OTHER SURGICAL HISTORY     tendon surgery     Home Medications:  Prior to Admission medications   Medication Sig Start Date End Date Taking? Authorizing Provider  acetaminophen (TYLENOL) 500 MG tablet Take 500 mg by mouth daily as needed (pain).   Yes [provider]  AMBULATORY NON FORMULARY MEDICATION FD guard take 2 capsules twice daily 30-60 minutes before meals  Lot #[id number] Exp 05/2021 x 7 boxes Patient taking differently: Take 1 capsule by mouth 2 (two) times daily as needed (acid reflux/indigestion). FD guard   Lot #[id number] Exp 05/2021 x 7 boxes 08/21/20  Yes Cirigliano, Vito V, DO  Calcium Carbonate Antacid (TUMS PO) Take 0.5 tablets by mouth at bedtime as needed (indigestion/acid reflux).   Yes [provider]  flurazepam (DALMANE) 15 MG capsule Take 15 mg by mouth at bedtime as needed for sleep.  Yes [provider]  HYDROcodone-acetaminophen (NORCO/VICODIN) 5-325 MG tablet Take 1 tablet by mouth daily as needed for severe pain. prn   Yes [provider]  loratadine (CLARITIN) 10 MG tablet Take 10 mg by mouth daily as needed (seasonal allergies (May)).   Yes [provider]  MILK THISTLE PO Take 1 tablet by mouth daily.   Yes [provider]  Multiple Vitamin (MULTIVITAMIN WITH MINERALS) TABS tablet Take 1 tablet by mouth 2 (two) times a week.   Yes [provider]  olopatadine  (PATANOL) 0.1 % ophthalmic solution Place 1 drop into both eyes 2 (two) times daily as needed (seasonal allergies (May)).   Yes [provider]  PEPPERMINT OIL PO Take 1 capsule by mouth every evening.   Yes [provider]  Saw Palmetto, Serenoa repens, (SAW PALMETTO PO) Take 1 tablet by mouth daily.   Yes [provider]    Inpatient Medications: Scheduled Meds:  aspirin  81 mg Oral Daily   atorvastatin  20 mg Oral Daily   metoprolol tartrate  25 mg Oral BID   sodium chloride flush  3 mL Intravenous Q12H   sodium chloride flush  3 mL Intravenous Q12H   Continuous Infusions:  sodium chloride     PRN Meds: sodium chloride, acetaminophen, nitroGLYCERIN, ondansetron (ZOFRAN) IV, sodium chloride flush  Allergies:    Allergies  Allergen Reactions   Other Itching    Grass pollen   Fentanyl Other (See Comments)    "bad taste" reaction to patch    Social History:   Social History   Socioeconomic History   Marital status: Married    Spouse name: Not on file   Number of children: 1   Years of education: Not on file   Highest education level: Not on file  Occupational History   Not on file  Tobacco Use   Smoking status: Former    Types: Cigarettes    Quit date: 04/13/1974    Years since quitting: 47.1   Smokeless tobacco: Former   Tobacco comments:    quit over 50 years ago. Smoked 1 ppd for 3 years when young  Vaping Use   Vaping Use: Never used  Substance and Sexual Activity   Alcohol use: Yes    Alcohol/week: 3.0 standard drinks    Types: 3 Standard drinks or equivalent per week    Comment: 1 oz of liquor 2-3 times a week   Drug use: No    Comment: remote use of cocaine when young   Sexual activity: Not on file  Other Topics Concern   Not on file  Social History Narrative   Not on file   Social Determinants of Health   Financial Resource Strain: Not on file  Food Insecurity: Not on file  Transportation Needs: Not on file  Physical  Activity: Not on file  Stress: Not on file  Social Connections: Not on file  Intimate Partner Violence: Not on file    Family History:   Family History  Problem Relation Age of Onset   Dementia Mother    Heart failure Father    Heart attack Paternal Grandfather    Colon cancer Neg Hx    Esophageal cancer Neg Hx    Pancreatic cancer Neg Hx    Liver disease Neg Hx      ROS:  Please see the history of present illness.  All other ROS reviewed and negative.     Physical Exam/Data:   Vitals:  05/12/21 1832 05/12/21 1956 05/12/21 2100 05/13/21 0612  BP: 132/82 126/82 126/79 106/65  Pulse: 86 85 82 71  Resp: 18 16 16 19   Temp:  99.3 F (37.4 C)  98.4 F (36.9 C)  TempSrc:  Oral  Oral  SpO2:  97%  97%  Weight:        Intake/Output Summary (Last 24 hours) at 05/13/2021 1259 Last data filed at 05/13/2021 0930 Gross per 24 hour  Intake 100 ml  Output 1175 ml  Net -1075 ml   Last 3 Weights 05/12/2021 12/16/2020 08/21/2020  Weight (lbs) 158 lb 11.7 oz 171 lb 4 oz 170 lb  Weight (kg) 72 kg 77.678 kg 77.111 kg     Body mass index is 22.14 kg/m.  General:  Well nourished, well developed, in no acute distress HEENT: normal Neck: no JVD Vascular: No carotid bruits; Distal pulses 2+ bilaterally Cardiac:  RRR; no murmurs gallops or rubs Lungs:  CTA b/l, no wheezing, rhonchi or rales  Abd: soft, nontender, no hepatomegaly  Ext: no edema Musculoskeletal:  No deformities Skin: warm and dry  Neuro:  no gross focal abnormalities noted Psych:  Normal affect   EKG:  The EKG was personally reviewed and demonstrates:    SR 80bpm, , baseline artifact/motion, no clear acute changes SR 80bpm SR 65bpm  Telemetry:  Telemetry was personally reviewed and demonstrates:    SR 60's, 2 NSVT episodes, 6 beats (slow approx 130bpm) and 5 beats, rare PVCs  Relevant CV Studies:  05/12/2021: LHC No angiographic evidence of CAD Normal LV systolic function 3.   No evidence of aortic root  dissection   05/13/21: TTE IMPRESSIONS   1. Left ventricular ejection fraction, by estimation, is 60 to 65%. The  left ventricle has normal function. The left ventricle has no regional  wall motion abnormalities. There is mild left ventricular hypertrophy of  the basal-septal segment. Left  ventricular diastolic parameters were normal.   2. Right ventricular systolic function is normal. The right ventricular  size is normal. Tricuspid regurgitation signal is inadequate for assessing  PA pressure.   3. The mitral valve is abnormal. Trivial mitral valve regurgitation. No  evidence of mitral stenosis. There is mild late systolic prolapse of both  leaflets of the mitral valve.   4. The aortic valve is grossly normal. Aortic valve regurgitation is not  visualized. No aortic stenosis is present.   5. Aortic dilatation noted. There is mild dilatation of the ascending  aorta, measuring 40 mm.   6. The inferior vena cava is normal in size with greater than 50%  respiratory variability, suggesting right atrial pressure of 3 mmHg.   Comparison(s): No prior Echocardiogram.   Laboratory Data:  High Sensitivity Troponin:   Recent Labs  Lab 05/12/21 1142 05/12/21 1429  TROPONINIHS 1,168* 1,134*     Chemistry Recent Labs  Lab 05/12/21 1142 05/13/21 0026  NA 140 138  K 4.4 3.7  CL 106 108  CO2 26 21*  GLUCOSE 109* 114*  BUN 17 14  CREATININE 1.12 1.03  CALCIUM 9.5 8.8*  GFRNONAA >60 >60  ANIONGAP 8 9    No results for input(s): PROT, ALBUMIN, AST, ALT, ALKPHOS, BILITOT in the last 168 hours. Lipids  Recent Labs  Lab 05/13/21 0026  CHOL 144  TRIG 57  HDL 53  LDLCALC 80  CHOLHDL 2.7    Hematology Recent Labs  Lab 05/12/21 1142  WBC 8.2  RBC 5.27  HGB 17.1*  HCT 49.9  MCV 94.7  MCH 32.4  MCHC 34.3  RDW 13.2  PLT 213   Thyroid No results for input(s): TSH, FREET4 in the last 168 hours.  BNPNo results for input(s): BNP, PROBNP in the last 168 hours.  DDimer   Recent Labs  Lab 05/12/21 1221  DDIMER 0.48     Radiology/Studies:  DG Chest 2 View Result Date: 05/12/2021 CLINICAL DATA:  chest pain EXAM: CHEST - 2 VIEW COMPARISON:  01/14/2021. FINDINGS: No consolidation. No visible pleural effusions or pneumothorax. Biapical pleuroparenchymal scarring. Similar blunting of the costophrenic sulci, possibly related to trace pleural effusions versus pleural thickening. Partially imaged thoracolumbar fusion. IMPRESSION: No evidence of acute cardiopulmonary disease. Electronically Signed   By: Margaretha Sheffield M.D.   On: 05/12/2021 11:59      Assessment and Plan:   L arm/neck pain HS Trop c/w NSTEMI No CAD Preserved LVEF NSVT COVID infection Aug 2022, positive test this admission most likely residual, not a new infection  Pending MRI to evaluate for myocarditis as per attending cardiologist  Started on BB here with NSTEMI and titrated up today No particular or new recommendations from EP perspective  Dr. Tanna Furry recommendations to follow     Risk Assessment/Risk Scores:     For questions or updates, please contact [facility] Please consult [email or web address] for contact info under    Signed, Baldwin Jamaica, PA-C  05/13/2021 12:59 PM   EP Attending  Patient seen and examined. Agree with the findings as noted above. The patient is referred by Dr. Gwenlyn Found for evaluation of NSVT. He is a pleasant 70 yo man with minimal residual medical history. He got Covid 3 months ago and had a mild case. He developed bilateral arm and neck pain and was admitted with an elevated troponin. He has preserve LV function. He had a heart cath demonstrating no obstructive CAD. He had an echo with preserved LV function. His exam is abnormal and his ECG shows NSR. He does not have any Covid symptoms. His tele demonstrates asymptomatic NSVT.  A/P NSVT - he is asymptomatic. It is reasonable to uptitrate his beta blocker and a cardiac MR is also reasonable to  rule out Covid mediated myocarditis. For now continue the beta blocker and uptitrate as you are able. Treatment of his asymptomatic NSVT with AA drugs is contraindicated.  Treyvon Blahut,MD Arm pain - resolved.

## 2021-05-13 NOTE — Care Management Obs Status (Signed)
[facility] NOTIFICATION   Patient Details  Name: Norman Pearson MRN: [id number] Date of Birth: 1951-02-06   Medicare Observation Status Notification Given:  Yes    Bethena Roys, RN 05/13/2021, 4:26 PM

## 2021-05-13 NOTE — Progress Notes (Signed)
Progress Note  Patient Name: Norman Pearson Date of Encounter: 05/13/2021  [facility] Cardiologist: None    Subjective   Norman Pearson was admitted yesterday with back and neck pain and "non-STEMI".  He is asymptomatic this morning.  He did test positive for COVID.  His cardiac cath was clean.  Inpatient Medications    Scheduled Meds:  aspirin  81 mg Oral Daily   atorvastatin  20 mg Oral Daily   metoprolol tartrate  12.5 mg Oral Once   metoprolol tartrate  25 mg Oral BID   sodium chloride flush  3 mL Intravenous Q12H   sodium chloride flush  3 mL Intravenous Q12H   Continuous Infusions:  sodium chloride     PRN Meds: sodium chloride, acetaminophen, nitroGLYCERIN, ondansetron (ZOFRAN) IV, sodium chloride flush   Vital Signs    Vitals:   05/12/21 1832 05/12/21 1956 05/12/21 2100 05/13/21 0612  BP: 132/82 126/82 126/79 106/65  Pulse: 86 85 82 71  Resp: 18 16 16 19   Temp:  99.3 F (37.4 C)  98.4 F (36.9 C)  TempSrc:  Oral  Oral  SpO2:  97%  97%  Weight:        Intake/Output Summary (Last 24 hours) at 05/13/2021 1053 Last data filed at 05/13/2021 0930 Gross per 24 hour  Intake 100 ml  Output 1175 ml  Net -1075 ml   Last 3 Weights 05/12/2021 12/16/2020 08/21/2020  Weight (lbs) 158 lb 11.7 oz 171 lb 4 oz 170 lb  Weight (kg) 72 kg 77.678 kg 77.111 kg      Telemetry    Sinus rhythm with nonsustained ventricular tachycardia- Personally Reviewed  ECG    Normal sinus rhythm at 65 without ST or T wave changes.- Personally Reviewed  Physical Exam   GEN: No acute distress.   Neck: No JVD Cardiac: RRR, no murmurs, rubs, or gallops.  Respiratory: Clear to auscultation bilaterally. GI: Soft, nontender, non-distended  MS: No edema; No deformity. Neuro:  Nonfocal  Psych: Normal affect   Labs    High Sensitivity Troponin:   Recent Labs  Lab 05/12/21 1142 05/12/21 1429  TROPONINIHS 1,168* 1,134*     Chemistry Recent Labs  Lab 05/12/21 1142  05/13/21 0026  NA 140 138  K 4.4 3.7  CL 106 108  CO2 26 21*  GLUCOSE 109* 114*  BUN 17 14  CREATININE 1.12 1.03  CALCIUM 9.5 8.8*  GFRNONAA >60 >60  ANIONGAP 8 9    Lipids  Recent Labs  Lab 05/13/21 0026  CHOL 144  TRIG 57  HDL 53  LDLCALC 80  CHOLHDL 2.7    Hematology Recent Labs  Lab 05/12/21 1142  WBC 8.2  RBC 5.27  HGB 17.1*  HCT 49.9  MCV 94.7  MCH 32.4  MCHC 34.3  RDW 13.2  PLT 213   Thyroid No results for input(s): TSH, FREET4 in the last 168 hours.  BNPNo results for input(s): BNP, PROBNP in the last 168 hours.  DDimer  Recent Labs  Lab 05/12/21 1221  DDIMER 0.48     Radiology    DG Chest 2 View  Result Date: 05/12/2021 CLINICAL DATA:  chest pain EXAM: CHEST - 2 VIEW COMPARISON:  01/14/2021. FINDINGS: No consolidation. No visible pleural effusions or pneumothorax. Biapical pleuroparenchymal scarring. Similar blunting of the costophrenic sulci, possibly related to trace pleural effusions versus pleural thickening. Partially imaged thoracolumbar fusion. IMPRESSION: No evidence of acute cardiopulmonary disease. Electronically Signed   By: Margaretha Sheffield  M.D.   On: 05/12/2021 11:59   CARDIAC CATHETERIZATION  Result Date: 05/12/2021 No angiographic evidence of CAD Normal LV systolic function 3.   No evidence of aortic root dissection Recommendations: Will admit to telemetry unit. Etiology of chest pain and elevated troponin is not clear. No CAD on cath. No obvious aortic root dissection. LV function is normal. Will check a d-dimer to exclude PE. Echo to exclude pericardial effusion. Consider myocarditis. Will continue ASA. Will stop IV heparin for now.    Cardiac Studies   Cardiac catheterization (05/12/2021)  Conclusion  No angiographic evidence of CAD Normal LV systolic function 3.   No evidence of aortic root dissection   Recommendations: Will admit to telemetry unit. Etiology of chest pain and elevated troponin is not clear. No CAD on  cath. No obvious aortic root dissection. LV function is normal. Will check a d-dimer to exclude PE. Echo to exclude pericardial effusion. Consider myocarditis. Will continue ASA. Will stop IV heparin for now.   Patient Profile     70 y.o. male who was admitted yesterday with chest and neck pain with positive enzymes, (troponins of 1000).  He had a clean cath yesterday and a normal ascending thoracic angiogram.  He is asymptomatic this morning.  Assessment & Plan    1: Non-STEMI-troponins of 1168 with cath that showed clean coronary arteries and normal LV function.  An ascending thoracic angiogram was normal as well ruling out dissection.  Etiology of his positive enzymes is still unclear.  The patient did test positive for COVID so it certainly is possible that he has a low-grade "myocarditis".  His on telemetry he has runs of nonsustained ventricular tachycardia.  We will titrate his beta-blocker.  2: Nonsustained ventricular tachycardia-we will titrate beta-blocker.  2D echo is pending.  Will order cardiac MRI to rule out Mark myocarditis.  For questions or updates, please contact [facility] Please consult [email or web address] for contact info under        Signed, Quay Burow, MD  05/13/2021, 10:53 AM

## 2021-05-13 NOTE — Progress Notes (Signed)
Patient had episode  of bigeminy  and 6 beats of vtach , asymptomatic , denies any pain nor any discomfort resting on bed . Vital signs stable . EKG done at [facility], Dr Phineas Inches notified , no new order given

## 2021-05-13 NOTE — Progress Notes (Signed)
°  Echocardiogram 2D Echocardiogram has been performed.  Norman Pearson 05/13/2021, 9:27 AM

## 2021-05-14 DIAGNOSIS — U071 COVID-19: Secondary | ICD-10-CM

## 2021-05-14 DIAGNOSIS — I214 Non-ST elevation (NSTEMI) myocardial infarction: Secondary | ICD-10-CM | POA: Diagnosis not present

## 2021-05-14 DIAGNOSIS — I400 Infective myocarditis: Secondary | ICD-10-CM

## 2021-05-14 DIAGNOSIS — I514 Myocarditis, unspecified: Secondary | ICD-10-CM | POA: Diagnosis not present

## 2021-05-14 DIAGNOSIS — Z87891 Personal history of nicotine dependence: Secondary | ICD-10-CM | POA: Diagnosis not present

## 2021-05-14 DIAGNOSIS — I4729 Other ventricular tachycardia: Secondary | ICD-10-CM | POA: Diagnosis not present

## 2021-05-14 MED ORDER — METOPROLOL TARTRATE 25 MG PO TABS: 25.0000 mg | ORAL_TABLET | Freq: Two times a day (BID) | ORAL | 0 refills | Status: DC

## 2021-05-14 MED ORDER — ASPIRIN 81 MG PO CHEW: 81.0000 mg | CHEWABLE_TABLET | Freq: Every day | ORAL | 1 refills | Status: AC

## 2021-05-14 NOTE — Discharge Summary (Addendum)
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   No evidence of aortic root dissection   Recommendations: Will admit to telemetry unit. Etiology of chest pain and elevated troponin is not clear. No CAD on cath. No obvious aortic root dissection. LV function is normal. Will check a d-dimer to exclude PE. Echo to exclude pericardial effusion. Consider myocarditis. Will continue ASA. Will stop IV heparin for now.   Echo: 05/13/21  IMPRESSIONS     1. Left ventricular ejection fraction, by estimation, is 60 to 65%. The  left ventricle has normal function. The left ventricle has no regional  wall motion abnormalities. There is mild left ventricular hypertrophy of  the basal-septal segment. Left  ventricular diastolic parameters were normal.   2. Right ventricular systolic function is normal. The right ventricular  size is normal. Tricuspid regurgitation signal is inadequate for assessing  PA pressure.   3. The mitral valve is abnormal. Trivial mitral valve regurgitation. No  evidence of mitral stenosis. There is mild late systolic prolapse of both  leaflets of the mitral valve.   4. The aortic valve is grossly normal. Aortic valve regurgitation is not  visualized. No aortic stenosis is present.   5. Aortic dilatation noted. There is mild dilatation of the ascending  aorta, measuring 40 mm.   6. The inferior vena cava is normal in size with greater than 50%  respiratory  variability, suggesting right atrial pressure of 3 mmHg.   Comparison(s): No prior Echocardiogram.   Conclusion(s)/Recommendation(s): Otherwise normal echocardiogram, with  minor abnormalities described in the report.   FINDINGS   Left Ventricle: Left ventricular ejection fraction, by estimation, is 60  to 65%. The left ventricle has normal function. The left ventricle has no  regional wall motion abnormalities. The left ventricular internal cavity  size was normal in size. There is   mild left ventricular hypertrophy of the basal-septal segment. Left  ventricular diastolic parameters were normal.   Right Ventricle: The right ventricular size is normal. No increase in  right ventricular wall thickness. Right ventricular systolic function is  normal. Tricuspid regurgitation signal is inadequate for assessing PA  pressure.   Left Atrium: Left atrial size was normal in size.   Right Atrium: Right atrial size was normal in size.   Pericardium: There is no evidence of pericardial effusion.   Mitral Valve: The mitral valve is abnormal. There is mild late systolic  prolapse of both leaflets of the mitral valve. There is moderate  thickening of the mitral valve leaflet(s). Trivial mitral valve  regurgitation. No evidence of mitral valve stenosis.   Tricuspid Valve: The tricuspid valve is normal in structure. Tricuspid  valve regurgitation is not demonstrated. No evidence of tricuspid  stenosis.   Aortic Valve: The aortic valve is grossly normal. Aortic valve  regurgitation is not visualized. No aortic stenosis is present.   Pulmonic Valve: The pulmonic valve was not well visualized. Pulmonic valve  regurgitation is not visualized.   Aorta: Aortic  dilatation noted. There is mild dilatation of the ascending  aorta, measuring 40 mm.   Venous: The inferior vena cava is normal in size with greater than 50%  respiratory variability, suggesting right atrial pressure of 3 mmHg.    IAS/Shunts: The atrial septum is grossly normal.  _____________   History of Present Illness     Norman Pearson is a 70 y.o. male with no PMH who presented with chest pain and NSTEMI. He presented with 2-day onset of intermittent neck pain radiating down the bilateral arm.  He denied any chest pain.  He was on no medication at home.  He denies any prior diagnosis of hypertension, hyperlipidemia or diabetes.  He reportedly was diagnosed with mitral valve prolapse when he was young.  He had a remote history of smoking for 3 years however quit about 50 years ago.  He drinks 1 ounce of liquor 2-3 times a week.  He had to bury his granddaughter's dog Saturday night prior to admission.  Afterward, he had neck pain radiating down the bilateral arm.  He did not have any chest pain.  By Sunday he was feeling fine.  He was able to walk a mile on the treadmill Sunday afternoon without any issue.  Sunday night after dinner, the neck pain and arm pain came back.  By the time he went to bed he still had a slight discomfort.  He woke up Monday morning continue to feel the neck pain and arm pain.  He checked his blood pressure at home which was in the 160s.  This was very unusual for him. This prompted him to seek urgent medical attention at [facility], ED.  Initial EKG showed no obvious ST-T wave changes.  EKG shows no acute changes.  Chest x-ray normal.  He has had intermittent shortness of breath for the past year however there is no noticeable change recently.  First hs troponin came back elevated at 1168. Cardiology service consulted for NSTEMI.  Hospital Course     Consultants: EP   NSTEMI/myocarditis: High-sensitivity troponin peaked at 1168.  Underwent cardiac catheterization noted above with no evidence of CAD.  Normal LV function.  Recommended to continue on aspirin and consider myocarditis in the setting of recent COVID infection.  Underwent cardiac MRI which was positive for myocarditis with some  evidence of myopericarditis.   Follow-up echocardiogram showed LVEF of 60 to 65% with no regional wall motion abnormality, mild LVH normal RV function.  NSVT: Evidenced on telemetry.  He was started on beta-blocker therapy.  Improvement.  Evaluated by EP, Dr. Lovena Le with recommendations to continue beta-blocker therapy.  COVID +: overall asymptomatic on admission. No indication for treatment.  Patient was seen by Dr. Gwenlyn Found and deemed stable for discharge.  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  Follow-up appointment arranged in the office.  Medication sent to patient's pharmacy of choice.   Did the patient have an acute coronary syndrome (MI, NSTEMI, STEMI, etc) this admission?:  No.   The elevated Troponin was due to the acute medical illness (demand ischemia).   _____________  Discharge Vitals Blood pressure 105/69, pulse 69, temperature 99 F (37.2 C), temperature source Oral, resp. rate 18, weight 72 kg, SpO2 99 %.  Filed Weights   05/12/21 1630  Weight: 72 kg    Labs & Radiologic Studies    CBC Recent Labs    05/12/21 1142  WBC 8.2  HGB 17.1*  HCT  49.9  MCV 94.7  PLT 836   Basic Metabolic Panel Recent Labs    05/12/21 1142 05/13/21 0026  NA 140 138  K 4.4 3.7  CL 106 108  CO2 26 21*  GLUCOSE 109* 114*  BUN 17 14  CREATININE 1.12 1.03  CALCIUM 9.5 8.8*   Liver Function Tests No results for input(s): AST, ALT, ALKPHOS, BILITOT, PROT, ALBUMIN in the last 72 hours. No results for input(s): LIPASE, AMYLASE in the last 72 hours. High Sensitivity Troponin:   Recent Labs  Lab 05/12/21 1142 05/12/21 1429  TROPONINIHS 1,168* 1,134*    BNP Invalid input(s): POCBNP D-Dimer Recent Labs    05/12/21 1221  DDIMER 0.48   Hemoglobin A1C Recent Labs    05/13/21 0026  HGBA1C 5.2   Fasting Lipid Panel Recent Labs    05/13/21 0026  CHOL 144  HDL 53  LDLCALC 80  TRIG 57  CHOLHDL 2.7   Thyroid Function  Tests No results for input(s): TSH, T4TOTAL, T3FREE, THYROIDAB in the last 72 hours.  Invalid input(s): FREET3 _____________  DG Chest 2 View  Result Date: 05/12/2021 CLINICAL DATA:  chest pain EXAM: CHEST - 2 VIEW COMPARISON:  01/14/2021. FINDINGS: No consolidation. No visible pleural effusions or pneumothorax. Biapical pleuroparenchymal scarring. Similar blunting of the costophrenic sulci, possibly related to trace pleural effusions versus pleural thickening. Partially imaged thoracolumbar fusion. IMPRESSION: No evidence of acute cardiopulmonary disease. Electronically Signed   By: Margaretha Sheffield M.D.   On: 05/12/2021 11:59   CARDIAC CATHETERIZATION  Result Date: 05/12/2021 No angiographic evidence of CAD Normal LV systolic function 3.   No evidence of aortic root dissection Recommendations: Will admit to telemetry unit. Etiology of chest pain and elevated troponin is not clear. No CAD on cath. No obvious aortic root dissection. LV function is normal. Will check a d-dimer to exclude PE. Echo to exclude pericardial effusion. Consider myocarditis. Will continue ASA. Will stop IV heparin for now.   MR CARDIAC MORPHOLOGY W WO CONTRAST  Result Date: 05/13/2021 CLINICAL DATA:  Clinical question of myocarditis Study assumes HCT of 49.9  And BSA of 1.9 m2. EXAM: CARDIAC MRI TECHNIQUE: The patient was scanned on a 1.5 Tesla GE magnet. A dedicated cardiac coil was used. Functional imaging was done using Fiesta sequences. 2,3, and 4 chamber views were done to assess for RWMA's. Modified Simpson's rule using a short axis stack was used to calculate an ejection fraction on a dedicated work Conservation officer, nature. The patient received 10 cc of Gadavist. After 10 minutes inversion recovery sequences were used to assess for infiltration and scar tissue. CONTRAST:  10 cc  of Gadavist FINDINGS: 1. Normal left ventricular size, with LVEDD 44 mm, and LVEDVi 69 mL/m2. Normal left ventricular thickness, with  intraventricular septal thickness of 10 mm, posterior wall thickness of 9 mm, and myocardial mass index of 55 g/m2. Normal left ventricular systolic function (LVEF =62%). There are no regional wall motion abnormalities. Left ventricular parametric mapping notable for Significant increase in the ECG in the apex (septal, inferior, lateral- 30-45%), the mid (inferior and inferolateral- 40-50%) and basal inferolateral (75%). There is significant increase in T2 signal in the mid (inferior and inferolateral 60-70%) and base (inferior and inferolateral 60-69%). There is late gadolinium enhancement in the left ventricular myocardium: Patchy in the mid inferior and inferolateral. 2. Normal right ventricular size with RVEDVI 84 mL/m2. Normal right ventricular thickness. Normal right ventricular systolic function (RVEF =94%). There are no regional wall motion  abnormalities or aneurysms. 3.  Normal left and right atrial size. 4.  Normal size of the pulmonary artery Mild aortic root dilation 40 mm. Mild thoracic aortic aneurysm 42 mm superior to the level of the main pulmonary artery bifurcation. 5. Valve assessment: Aortic Valve: Tri-leaflet aortic valve. Qualitatively no significant regurgitation. Pulmonic Valve: Qualitatively no significant regurgitation. Tricuspid Valve: Qualitatively no significant regurgitation. Mitral Valve: There is mild bileaflet prolapse without significant leaflet thickening and with no significant regurgitation. 6. Small pericardial effusion posterior to the left ventricle. There is no pericardial thickening. There is slight pericardial enhancement overlying the right ventricle. 7. Grossly, no extracardiac findings. Recommended dedicated study if concerned for non-cardiac pathology. IMPRESSION: Modified [place] Criteria met for Myocarditis. Some evidence of myopericarditis. Evidence of mild root and ascending aortic dilation. Consider secondary imaging modality (echocardiogram, CTA Aorta  Protocol, MRA Aorta Protocol) in one year for follow up if clinically indicated. Rudean Haskell MD Electronically Signed   By: Rudean Haskell M.D.   On: 05/13/2021 18:05   ECHOCARDIOGRAM COMPLETE  Result Date: 05/13/2021    ECHOCARDIOGRAM REPORT   Patient Name:   Norman Pearson Date of Exam: 05/13/2021 Medical Rec #:  [id number]        Height:       71.0 in Accession #:    [id number]       Weight:       158.7 lb Date of Birth:  [birth year]        BSA:          1.911 m Patient Age:    70 years         BP:           106/65 mmHg Patient Gender: M                HR:           68 bpm. Exam Location:  Inpatient Procedure: 3D Echo, 2D Echo, Cardiac Doppler and Color Doppler Indications:    R07.9* Chest pain, unspecified  History:        Patient has no prior history of Echocardiogram examinations.                 Acute MI; Signs/Symptoms:Chest Pain. Covid positive.  Sonographer:    Roseanna Rainbow RDCS Referring Phys: [id number] HAO MENG  Sonographer Comments: Suboptimal parasternal window and Technically difficult study due to poor echo windows. Very low parasternal window IMPRESSIONS  1. Left ventricular ejection fraction, by estimation, is 60 to 65%. The left ventricle has normal function. The left ventricle has no regional wall motion abnormalities. There is mild left ventricular hypertrophy of the basal-septal segment. Left ventricular diastolic parameters were normal.  2. Right ventricular systolic function is normal. The right ventricular size is normal. Tricuspid regurgitation signal is inadequate for assessing PA pressure.  3. The mitral valve is abnormal. Trivial mitral valve regurgitation. No evidence of mitral stenosis. There is mild late systolic prolapse of both leaflets of the mitral valve.  4. The aortic valve is grossly normal. Aortic valve regurgitation is not visualized. No aortic stenosis is present.  5. Aortic dilatation noted. There is mild dilatation of the ascending aorta, measuring 40 mm.   6. The inferior vena cava is normal in size with greater than 50% respiratory variability, suggesting right atrial pressure of 3 mmHg. Comparison(s): No prior Echocardiogram. Conclusion(s)/Recommendation(s): Otherwise normal echocardiogram, with minor abnormalities described in the report. FINDINGS  Left Ventricle: Left ventricular ejection fraction, by estimation,  is 60 to 65%. The left ventricle has normal function. The left ventricle has no regional wall motion abnormalities. The left ventricular internal cavity size was normal in size. There is  mild left ventricular hypertrophy of the basal-septal segment. Left ventricular diastolic parameters were normal. Right Ventricle: The right ventricular size is normal. No increase in right ventricular wall thickness. Right ventricular systolic function is normal. Tricuspid regurgitation signal is inadequate for assessing PA pressure. Left Atrium: Left atrial size was normal in size. Right Atrium: Right atrial size was normal in size. Pericardium: There is no evidence of pericardial effusion. Mitral Valve: The mitral valve is abnormal. There is mild late systolic prolapse of both leaflets of the mitral valve. There is moderate thickening of the mitral valve leaflet(s). Trivial mitral valve regurgitation. No evidence of mitral valve stenosis. Tricuspid Valve: The tricuspid valve is normal in structure. Tricuspid valve regurgitation is not demonstrated. No evidence of tricuspid stenosis. Aortic Valve: The aortic valve is grossly normal. Aortic valve regurgitation is not visualized. No aortic stenosis is present. Pulmonic Valve: The pulmonic valve was not well visualized. Pulmonic valve regurgitation is not visualized. Aorta: Aortic dilatation noted. There is mild dilatation of the ascending aorta, measuring 40 mm. Venous: The inferior vena cava is normal in size with greater than 50% respiratory variability, suggesting right atrial pressure of 3 mmHg. IAS/Shunts: The  atrial septum is grossly normal.  LEFT VENTRICLE PLAX 2D LVIDd:         4.40 cm     Diastology LVIDs:         2.50 cm     LV e' medial:    5.66 cm/s LV PW:         1.10 cm     LV E/e' medial:  12.2 LV IVS:        1.10 cm     LV e' lateral:   6.53 cm/s LVOT diam:     2.30 cm     LV E/e' lateral: 10.6 LV SV:         74 LV SV Index:   39 LVOT Area:     4.15 cm  LV Volumes (MOD) LV vol d, MOD A2C: 88.5 ml LV vol d, MOD A4C: 98.7 ml LV vol s, MOD A2C: 28.5 ml LV vol s, MOD A4C: 28.6 ml LV SV MOD A2C:     60.0 ml LV SV MOD A4C:     98.7 ml LV SV MOD BP:      64.5 ml RIGHT VENTRICLE             IVC RV S prime:     11.70 cm/s  IVC diam: 1.40 cm TAPSE (M-mode): 1.8 cm LEFT ATRIUM             Index        RIGHT ATRIUM           Index LA diam:        3.40 cm 1.78 cm/m   RA Area:     14.90 cm LA Vol (A2C):   22.5 ml 11.77 ml/m  RA Volume:   36.50 ml  19.10 ml/m LA Vol (A4C):   20.4 ml 10.67 ml/m LA Biplane Vol: 21.7 ml 11.35 ml/m  AORTIC VALVE LVOT Vmax:   97.30 cm/s LVOT Vmean:  58.000 cm/s LVOT VTI:    0.179 m  AORTA Ao Root diam: 3.50 cm Ao Asc diam:  4.00 cm MITRAL VALVE MV Area (PHT): 2.58 cm  SHUNTS MV Decel Time: 294 msec    Systemic VTI:  0.18 m MV E velocity: 69.00 cm/s  Systemic Diam: 2.30 cm MV A velocity: 74.60 cm/s MV E/A ratio:  0.92 Buford Dresser MD Electronically signed by Buford Dresser MD Signature Date/Time: 05/13/2021/12:08:39 PM    Final    Disposition   Pt is being discharged home today in good condition.  Follow-up Plans & Appointments     Follow-up Information     Deberah Pelton, NP Follow up on 06/06/2021.   Specialty: Cardiology Why: at 8:25am for your follow up appt with Dr. Rachel Bo' NP Contact information: [address] [place] [address] [phone]                Discharge Instructions     Call MD for:  difficulty breathing, headache or visual disturbances   Complete by: As directed    Call MD for:  persistant dizziness or  light-headedness   Complete by: As directed    Call MD for:  redness, tenderness, or signs of infection (pain, swelling, redness, odor or green/yellow discharge around incision site)   Complete by: As directed    Diet - low sodium heart healthy   Complete by: As directed    Discharge instructions   Complete by: As directed    Radial Site Care Refer to this sheet in the next few weeks. These instructions provide you with information on caring for yourself after your procedure. Your caregiver may also give you more specific instructions. Your treatment has been planned according to current medical practices, but problems sometimes occur. Call your caregiver if you have any problems or questions after your procedure. HOME CARE INSTRUCTIONS You may shower the day after the procedure. Remove the bandage (dressing) and gently wash the site with plain soap and water. Gently pat the site dry.  Do not apply powder or lotion to the site.  Do not submerge the affected site in water for 3 to 5 days.  Inspect the site at least twice daily.  Do not flex or bend the affected arm for 24 hours.  No lifting over 5 pounds (2.3 kg) for 5 days after your procedure.  Do not drive home if you are discharged the same day of the procedure. Have someone else drive you.  You may drive 24 hours after the procedure unless otherwise instructed by your caregiver.  What to expect: Any bruising will usually fade within 1 to 2 weeks.  Blood that collects in the tissue (hematoma) may be painful to the touch. It should usually decrease in size and tenderness within 1 to 2 weeks.  SEEK IMMEDIATE MEDICAL CARE IF: You have unusual pain at the radial site.  You have redness, warmth, swelling, or pain at the radial site.  You have drainage (other than a small amount of blood on the dressing).  You have chills.  You have a fever or persistent symptoms for more than 72 hours.  You have a fever and your symptoms suddenly get worse.   Your arm becomes pale, cool, tingly, or numb.  You have heavy bleeding from the site. Hold pressure on the site.   Increase activity slowly   Complete by: As directed        Discharge Medications   Allergies as of 05/14/2021       Reactions   Other Itching   Grass pollen   Fentanyl Other (See Comments)   "bad taste" reaction to patch  Medication List     TAKE these medications    acetaminophen 500 MG tablet Commonly known as: TYLENOL Take 500 mg by mouth daily as needed (pain).   AMBULATORY NON FORMULARY MEDICATION FD guard take 2 capsules twice daily 30-60 minutes before meals  Lot #[id number] Exp 05/2021 x 7 boxes What changed:  how much to take how to take this when to take this reasons to take this additional instructions   aspirin 81 MG chewable tablet Chew 1 tablet (81 mg total) by mouth daily. Start taking on: May 15, 2021   flurazepam 15 MG capsule Commonly known as: DALMANE Take 15 mg by mouth at bedtime as needed for sleep.   HYDROcodone-acetaminophen 5-325 MG tablet Commonly known as: NORCO/VICODIN Take 1 tablet by mouth daily as needed for severe pain. prn   loratadine 10 MG tablet Commonly known as: CLARITIN Take 10 mg by mouth daily as needed (seasonal allergies (May)).   metoprolol tartrate 25 MG tablet Commonly known as: LOPRESSOR Take 1 tablet (25 mg total) by mouth 2 (two) times daily.   MILK THISTLE PO Take 1 tablet by mouth daily.   multivitamin with minerals Tabs tablet Take 1 tablet by mouth 2 (two) times a week.   olopatadine 0.1 % ophthalmic solution Commonly known as: PATANOL Place 1 drop into both eyes 2 (two) times daily as needed (seasonal allergies (May)).   PEPPERMINT OIL PO Take 1 capsule by mouth every evening.   SAW PALMETTO PO Take 1 tablet by mouth daily.   TUMS PO Take 0.5 tablets by mouth at bedtime as needed (indigestion/acid reflux).         Outstanding Labs/Studies   N/a    Duration of Discharge Encounter   Greater than 30 minutes including physician time.  Signed, Reino Bellis, NP 05/14/2021, 11:38 AM  Agree with note by Reino Bellis NP-C  Norman Pearson  was admitted with back and neck pain positive troponins of 1000.  These risks symptoms similar to prior COVID infection and he ultimately tested positive for COVID.  Cardiac catheterization revealed normal coronary arteries and normal LV function confirmed by 2D echo.  Cardiac MRI confirmed myocarditis.  He is currently pain-free/symptom-free.  He was having nonsustained ventricular tachycardia on telemetry.  Has been treated with beta-blockade on the advice of EP who consulted.  He stable for discharge home today.  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  We will arrange TOC 7 followed by return office with me in 3 to 4 weeks.  Lorretta Harp, M.D., [place], [facility], [place] [address] [place],   [address]  [phone] 05/14/2021 12:46 PM

## 2021-06-03 NOTE — Progress Notes (Signed)
Cardiology Clinic Note   Patient Name: Norman Pearson Date of Encounter: 06/06/2021  Primary Care Provider:  Prince Solian, MD Primary Cardiologist:  Quay Burow, MD  Patient Profile    Norman Pearson 71 year old male presents to the clinic today for follow-up evaluation of his  NSTEMI in the setting of elevated troponins, clean cath and repeat COVID infection.  Cardiac MRI showed myocarditis.  Past Medical History    Past Medical History:  Diagnosis Date   COVID-19 12/2020   Past Surgical History:  Procedure Laterality Date   COLONOSCOPY  2016   Dr St[facility]. Michela Pitcher he was clean   EXTERNAL FIXATION LEG  04/14/2011   Procedure: EXTERNAL FIXATION LEG;  Surgeon: Wylene Simmer, MD;  Location: [facility];  Service: Orthopedics;  Laterality: Left;  External fixation left lower leg   I & D EXTREMITY  04/14/2011   Procedure: IRRIGATION AND DEBRIDEMENT EXTREMITY;  Surgeon: Wylene Simmer, MD;  Location: [facility];  Service: Orthopedics;  Laterality: Left;  Repair of left hand laceration   LEFT HEART CATH AND CORONARY ANGIOGRAPHY N/A 05/12/2021   Procedure: LEFT HEART CATH AND CORONARY ANGIOGRAPHY;  Surgeon: Burnell Blanks, MD;  Location: [facility];  Service: Cardiovascular;  Laterality: N/A;   ORIF TIBIA FRACTURE  04/23/2011   Procedure: OPEN REDUCTION INTERNAL FIXATION (ORIF) TIBIA FRACTURE;  Surgeon: Wylene Simmer, MD;  Location: [facility];  Service: Orthopedics;  Laterality: Left;  Removal of External Fixator, Open reduction internal fixation Left Tibial Pilon Fracture    OTHER SURGICAL HISTORY     tendon surgery    Allergies  Allergies  Allergen Reactions   Other Itching    Grass pollen   Fentanyl Other (See Comments)    "bad taste" reaction to patch    History of Present Illness    Norman Pearson has a PMH of chest pain and NSTEMI.  He presented to the emergency department after having 2 days of neck pain that radiated down bilateral arms.  He was previously  diagnosed with mitral valve prolapse as a child.  He was a remote smoker for 3 years but did quit 50 years ago.  He reports drinking 1 ounce of liquor 2-3 times per week.  He buried his granddaughter's dog the night prior to admission.  After the incident he did note neck pain that radiated down bilateral arms.  He denied chest discomfort.  He felt fine the next day.  He was able to walk on the treadmill without discomfort.  His pain returned Sunday night, with neck pain and arm pain.  He reported that his blood pressure was elevated in the 509 systolic.  He reported to the emergency department.  His EKG showed no acute changes, chest x-ray was normal, but did have elevated troponins at 1168.  Cardiology was consulted.  He underwent cardiac catheterization which showed no evidence of coronary artery disease and normal LV function.  He underwent cardiac MRI which showed myocarditis with some evidence of myopericarditis.  Follow-up echocardiogram showed an LVEF of 60 to 65% with no regional wall motion abnormalities.  He was noted to have NSVT on telemetry.  He was started on beta-blocker therapy.  It was felt that his COVID infection may have caused his myocarditis.  However, he was asymptomatic on admission and there were no indications for treatment.  He presents the clinic today for follow-up evaluation states he feels well today.  He reports that he has started to  wean off his metoprolol.  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  He wonders if the FFA will make him repeat MRI to evaluate for inflammation.  We reviewed his angiography report, echocardiogram, and cardiac MRI.  He expressed understanding.  He has reduced his physical activity since being in the hospital.  He typically is very physically active doing chores around his house such as cutting wood mowing grass.  I have asked him to slowly return to his physical activity, we will reduce his metoprolol to 12.5 mg twice daily.  I  will give him the salty 6 diet sheet and plan follow-up in 4 months.  Today he denies chest pain, shortness of breath, lower extremity edema, fatigue, palpitations, melena, hematuria, hemoptysis, diaphoresis, weakness, presyncope, syncope, orthopnea, and PND.   Home Medications    Prior to Admission medications   Medication Sig Start Date End Date Taking? Authorizing Provider  acetaminophen (TYLENOL) 500 MG tablet Take 500 mg by mouth daily as needed (pain).    [provider]  AMBULATORY NON FORMULARY MEDICATION FD guard take 2 capsules twice daily 30-60 minutes before meals  Lot #[id number] Exp 05/2021 x 7 boxes Patient taking differently: Take 1 capsule by mouth 2 (two) times daily as needed (acid reflux/indigestion). FD guard   Lot #[id number] Exp 05/2021 x 7 boxes 08/21/20   Cirigliano, Vito V, DO  aspirin 81 MG chewable tablet Chew 1 tablet (81 mg total) by mouth daily. 05/15/21   Cheryln Manly, NP  Calcium Carbonate Antacid (TUMS PO) Take 0.5 tablets by mouth at bedtime as needed (indigestion/acid reflux).    [provider]  flurazepam (DALMANE) 15 MG capsule Take 15 mg by mouth at bedtime as needed for sleep.    [provider]  HYDROcodone-acetaminophen (NORCO/VICODIN) 5-325 MG tablet Take 1 tablet by mouth daily as needed for severe pain. prn    [provider]  loratadine (CLARITIN) 10 MG tablet Take 10 mg by mouth daily as needed (seasonal allergies (May)).    [provider]  metoprolol tartrate (LOPRESSOR) 25 MG tablet Take 1 tablet (25 mg total) by mouth 2 (two) times daily. 05/14/21   Cheryln Manly, NP  MILK THISTLE PO Take 1 tablet by mouth daily.    [provider]  Multiple Vitamin (MULTIVITAMIN WITH MINERALS) TABS tablet Take 1 tablet by mouth 2 (two) times a week.    [provider]  olopatadine (PATANOL) 0.1 % ophthalmic solution Place 1 drop into both eyes 2 (two) times daily as needed (seasonal  allergies (May)).    [provider]  PEPPERMINT OIL PO Take 1 capsule by mouth every evening.    [provider]  Saw Palmetto, Serenoa repens, (SAW PALMETTO PO) Take 1 tablet by mouth daily.    [provider]    Family History    Family History  Problem Relation Age of Onset   Dementia Mother    Heart failure Father    Heart attack Paternal Grandfather    Colon cancer Neg Hx    Esophageal cancer Neg Hx    Pancreatic cancer Neg Hx    Liver disease Neg Hx    He indicated that his mother is deceased. He indicated that his father is deceased. He indicated that his paternal grandfather is deceased. He indicated that the status of his neg hx is unknown.  Social History    Social History   Socioeconomic History   Marital  status: Married    Spouse name: Not on file   Number of children: 1   Years of education: Not on file   Highest education level: Not on file  Occupational History   Not on file  Tobacco Use   Smoking status: Former    Types: Cigarettes    Quit date: 04/13/1974    Years since quitting: 47.1   Smokeless tobacco: Former   Tobacco comments:    quit over 50 years ago. Smoked 1 ppd for 3 years when young  Vaping Use   Vaping Use: Never used  Substance and Sexual Activity   Alcohol use: Yes    Alcohol/week: 3.0 standard drinks    Types: 3 Standard drinks or equivalent per week    Comment: 1 oz of liquor 2-3 times a week   Drug use: No    Comment: remote use of cocaine when young   Sexual activity: Not on file  Other Topics Concern   Not on file  Social History Narrative   Not on file   Social Determinants of Health   Financial Resource Strain: Not on file  Food Insecurity: Not on file  Transportation Needs: Not on file  Physical Activity: Not on file  Stress: Not on file  Social Connections: Not on file  Intimate Partner Violence: Not on file     Review of Systems    General:  No chills, fever, night sweats or weight  changes.  Cardiovascular:  No chest pain, dyspnea on exertion, edema, orthopnea, palpitations, paroxysmal nocturnal dyspnea. Dermatological: No rash, lesions/masses Respiratory: No cough, dyspnea Urologic: No hematuria, dysuria Abdominal:   No nausea, vomiting, diarrhea, bright red blood per rectum, melena, or hematemesis Neurologic:  No visual changes, wkns, changes in mental status. All other systems reviewed and are otherwise negative except as noted above.  Physical Exam    VS:  BP 112/74 (BP Location: Left Arm, Patient Position: Sitting, Cuff Size: Normal)    Pulse (!) 43    Ht 5' 9" (1.753 m)    Wt 178 lb 3.2 oz (80.8 kg)    BMI 26.32 kg/m  , BMI Body mass index is 26.32 kg/m. GEN: Well nourished, well developed, in no acute distress. HEENT: normal. Neck: Supple, no JVD, carotid bruits, or masses. Cardiac: RRR, no murmurs, rubs, or gallops. No clubbing, cyanosis, edema.  Radials/DP/PT 2+ and equal bilaterally.  Respiratory:  Respirations regular and unlabored, clear to auscultation bilaterally. GI: Soft, nontender, nondistended, BS + x 4. MS: no deformity or atrophy. Skin: warm and dry, no rash. Neuro:  Strength and sensation are intact. Psych: Normal affect.  Accessory Clinical Findings    Recent Labs: 01/14/2021: ALT 29 05/12/2021: Hemoglobin 17.1; Platelets 213 05/13/2021: BUN 14; Creatinine, Ser 1.03; Potassium 3.7; Sodium 138   Recent Lipid Panel    Component Value Date/Time   CHOL 144 05/13/2021 0026   TRIG 57 05/13/2021 0026   HDL 53 05/13/2021 0026   CHOLHDL 2.7 05/13/2021 0026   VLDL 11 05/13/2021 0026   LDLCALC 80 05/13/2021 0026    ECG personally reviewed by me today-none today.  Cardiac catheterization 05/12/2021 No angiographic evidence of CAD Normal LV systolic function 3.   No evidence of aortic root dissection   Recommendations: Will admit to telemetry unit. Etiology of chest pain and elevated troponin is not clear. No CAD on cath. No obvious  aortic root dissection. LV function is normal. Will check a d-dimer to exclude PE. Echo to exclude pericardial effusion.  Consider myocarditis. Will continue ASA. Will stop IV heparin for now.   Diagnostic Dominance: Right Intervention   Echocardiogram 05/13/2021  IMPRESSIONS     1. Left ventricular ejection fraction, by estimation, is 60 to 65%. The  left ventricle has normal function. The left ventricle has no regional  wall motion abnormalities. There is mild left ventricular hypertrophy of  the basal-septal segment. Left  ventricular diastolic parameters were normal.   2. Right ventricular systolic function is normal. The right ventricular  size is normal. Tricuspid regurgitation signal is inadequate for assessing  PA pressure.   3. The mitral valve is abnormal. Trivial mitral valve regurgitation. No  evidence of mitral stenosis. There is mild late systolic prolapse of both  leaflets of the mitral valve.   4. The aortic valve is grossly normal. Aortic valve regurgitation is not  visualized. No aortic stenosis is present.   5. Aortic dilatation noted. There is mild dilatation of the ascending  aorta, measuring 40 mm.   6. The inferior vena cava is normal in size with greater than 50%  respiratory variability, suggesting right atrial pressure of 3 mmHg.   Comparison(s): No prior Echocardiogram.   Conclusion(s)/Recommendation(s): Otherwise normal echocardiogram, with  minor abnormalities described in the report.  Cardiac MRI 05/13/2021  FINDINGS: 1. Normal left ventricular size, with LVEDD 44 mm, and LVEDVi 69 mL/m2.   Normal left ventricular thickness, with intraventricular septal thickness of 10 mm, posterior wall thickness of 9 mm, and myocardial mass index of 55 g/m2.   Normal left ventricular systolic function (LVEF =87%). There are no regional wall motion abnormalities.   Left ventricular parametric mapping notable for Significant increase in the ECG in the apex  (septal, inferior, lateral- 30-45%), the mid (inferior and inferolateral- 40-50%) and basal inferolateral (75%). There is significant increase in T2 signal in the mid (inferior and inferolateral 60-70%) and base (inferior and inferolateral 60-69%).   There is late gadolinium enhancement in the left ventricular myocardium: Patchy in the mid inferior and inferolateral.   2. Normal right ventricular size with RVEDVI 84 mL/m2.   Normal right ventricular thickness.   Normal right ventricular systolic function (RVEF =86%). There are no regional wall motion abnormalities or aneurysms.   3.  Normal left and right atrial size.   4.  Normal size of the pulmonary artery   Mild aortic root dilation 40 mm.   Mild thoracic aortic aneurysm 42 mm superior to the level of the main pulmonary artery bifurcation.   5. Valve assessment:   Aortic Valve: Tri-leaflet aortic valve. Qualitatively no significant regurgitation.   Pulmonic Valve: Qualitatively no significant regurgitation.   Tricuspid Valve: Qualitatively no significant regurgitation.   Mitral Valve: There is mild bileaflet prolapse without significant leaflet thickening and with no significant regurgitation.   6. Small pericardial effusion posterior to the left ventricle. There is no pericardial thickening. There is slight pericardial enhancement overlying the right ventricle.   7. Grossly, no extracardiac findings. Recommended dedicated study if concerned for non-cardiac pathology.   IMPRESSION: Modified [place] Criteria met for Myocarditis. Some evidence of myopericarditis.   Evidence of mild root and ascending aortic dilation. Consider secondary imaging modality (echocardiogram, CTA Aorta Protocol, MRA Aorta Protocol) in one year for follow up if clinically indicated.   Rudean Haskell MD     Electronically Signed   By: Rudean Haskell M.D.   On: 05/13/2021 18:05 Assessment & Plan   1.   NSTEMI/myocarditis-no further episodes of arm neck back  or chest discomfort.  Presented to the hospital with neck and bilateral arm discomfort.  Underwent cardiac catheterization which showed clean cath.  Details above.  Cardiac MRI 05/13/2021 showed myocarditis in the setting of recent COVID infection. Continue aspirin,  Reduce metoprolol to 12.5 mg twice daily Heart healthy low-sodium diet-salty 6 given Increase physical activity as tolerated Patient reassured that symptoms were not related to coronary blockage.  NSVT-denies further episodes of accelerated heartbeat.  Noted to have NSVT during admission while on telemetry.  Patient started on beta-blocker therapy at that time. Reduce metoprolol to 12.5 mg twice daily Heart healthy low-sodium diet-salty 6 given Increase physical activity as tolerated  COVID infection-denies further episodes of neck and arm discomfort.  Denies cough and shortness of breath.  Has resumed normal physical activity.  Patient is a Astronomer.  Given that he has had no further episodes of chest discomfort/cardiac symptoms, and has returned to normal physical activity he may return to flying.  Disposition: Follow-up with Dr. Gwenlyn Found in 4 months.    Jossie Ng. Lavere Stork NP-C    06/06/2021, 9:17 AM [place] [address] Office [phone] Fax [phone]  Notice: This dictation was prepared with Dragon dictation along with smaller phrase technology. Any transcriptional errors that result from this process are unintentional and may not be corrected upon review.  I spent 15 minutes examining this patient, reviewing medications, and using patient centered shared decision making involving her cardiac care.  Prior to her visit I spent greater than 20 minutes reviewing her past medical history,  medications, and prior cardiac tests.

## 2021-06-06 ENCOUNTER — Ambulatory Visit: Payer: Medicare HMO | Admitting: General Practice

## 2021-06-06 ENCOUNTER — Encounter: Payer: Self-pay | Admitting: General Practice

## 2021-06-06 ENCOUNTER — Other Ambulatory Visit: Payer: Self-pay

## 2021-06-06 VITALS — BP 112/74 | HR 43 | Ht 69.0 in | Wt 178.2 lb

## 2021-06-06 DIAGNOSIS — I214 Non-ST elevation (NSTEMI) myocardial infarction: Secondary | ICD-10-CM | POA: Diagnosis not present

## 2021-06-06 DIAGNOSIS — U071 COVID-19: Secondary | ICD-10-CM | POA: Diagnosis not present

## 2021-06-06 DIAGNOSIS — I4729 Other ventricular tachycardia: Secondary | ICD-10-CM

## 2021-06-06 DIAGNOSIS — I400 Infective myocarditis: Secondary | ICD-10-CM

## 2021-06-06 MED ORDER — METOPROLOL TARTRATE 25 MG PO TABS: 12.5000 mg | ORAL_TABLET | Freq: Two times a day (BID) | ORAL | 5 refills | Status: DC

## 2021-06-06 NOTE — Patient Instructions (Signed)
Medication Instructions:  DECREASE METOPROLOL 12.5MG  DAILY  *If you need a refill on your cardiac medications before your next appointment, please call your pharmacy*  Lab Work:   Testing/Procedures:  NONE    NONE  Special Instructions PLEASE READ AND FOLLOW SALTY 6-ATTACHED-1,800mg  daily  PLEASE INCREASE PHYSICAL ACTIVITY AS TOLERATED   Follow-Up: Your next appointment:  4 month(s) In Person with Quay Burow, MD ONLY.:1  At [facility], you and your health needs are our priority.  As part of our continuing mission to provide you with exceptional heart care, we have created designated Provider Care Teams.  These Care Teams include your primary Cardiologist (physician) and Advanced Practice Providers (APPs -  Physician Assistants and Nurse Practitioners) who all work together to provide you with the care you need, when you need it.            6 SALTY THINGS TO AVOID     1,800MG  DAILY

## 2021-06-18 DIAGNOSIS — H02052 Trichiasis without entropian right lower eyelid: Secondary | ICD-10-CM | POA: Diagnosis not present

## 2021-07-30 DIAGNOSIS — L820 Inflamed seborrheic keratosis: Secondary | ICD-10-CM | POA: Diagnosis not present

## 2021-07-30 DIAGNOSIS — L821 Other seborrheic keratosis: Secondary | ICD-10-CM | POA: Diagnosis not present

## 2021-07-30 DIAGNOSIS — X32XXXD Exposure to sunlight, subsequent encounter: Secondary | ICD-10-CM | POA: Diagnosis not present

## 2021-07-30 DIAGNOSIS — D225 Melanocytic nevi of trunk: Secondary | ICD-10-CM | POA: Diagnosis not present

## 2021-07-30 DIAGNOSIS — L570 Actinic keratosis: Secondary | ICD-10-CM | POA: Diagnosis not present

## 2021-08-08 DIAGNOSIS — Z79899 Other long term (current) drug therapy: Secondary | ICD-10-CM | POA: Diagnosis not present

## 2021-08-08 DIAGNOSIS — G629 Polyneuropathy, unspecified: Secondary | ICD-10-CM | POA: Diagnosis not present

## 2021-08-08 DIAGNOSIS — K219 Gastro-esophageal reflux disease without esophagitis: Secondary | ICD-10-CM | POA: Diagnosis not present

## 2021-08-08 DIAGNOSIS — Z125 Encounter for screening for malignant neoplasm of prostate: Secondary | ICD-10-CM | POA: Diagnosis not present

## 2021-08-08 DIAGNOSIS — R7989 Other specified abnormal findings of blood chemistry: Secondary | ICD-10-CM | POA: Diagnosis not present

## 2021-08-12 DIAGNOSIS — H2513 Age-related nuclear cataract, bilateral: Secondary | ICD-10-CM | POA: Diagnosis not present

## 2021-08-12 DIAGNOSIS — D3131 Benign neoplasm of right choroid: Secondary | ICD-10-CM | POA: Diagnosis not present

## 2021-08-12 DIAGNOSIS — H52203 Unspecified astigmatism, bilateral: Secondary | ICD-10-CM | POA: Diagnosis not present

## 2021-08-15 DIAGNOSIS — K219 Gastro-esophageal reflux disease without esophagitis: Secondary | ICD-10-CM | POA: Diagnosis not present

## 2021-08-15 DIAGNOSIS — R82998 Other abnormal findings in urine: Secondary | ICD-10-CM | POA: Diagnosis not present

## 2021-08-15 DIAGNOSIS — M5416 Radiculopathy, lumbar region: Secondary | ICD-10-CM | POA: Diagnosis not present

## 2021-08-15 DIAGNOSIS — I214 Non-ST elevation (NSTEMI) myocardial infarction: Secondary | ICD-10-CM | POA: Diagnosis not present

## 2021-08-15 DIAGNOSIS — N400 Enlarged prostate without lower urinary tract symptoms: Secondary | ICD-10-CM | POA: Diagnosis not present

## 2021-08-15 DIAGNOSIS — G629 Polyneuropathy, unspecified: Secondary | ICD-10-CM | POA: Diagnosis not present

## 2021-08-15 DIAGNOSIS — M19079 Primary osteoarthritis, unspecified ankle and foot: Secondary | ICD-10-CM | POA: Diagnosis not present

## 2021-08-15 DIAGNOSIS — G4700 Insomnia, unspecified: Secondary | ICD-10-CM | POA: Diagnosis not present

## 2021-08-15 DIAGNOSIS — Z0000 Encounter for general adult medical examination without abnormal findings: Secondary | ICD-10-CM | POA: Diagnosis not present

## 2021-09-01 ENCOUNTER — Telehealth: Payer: Self-pay | Admitting: Cardiovascular Disease

## 2021-09-01 DIAGNOSIS — I214 Non-ST elevation (NSTEMI) myocardial infarction: Secondary | ICD-10-CM

## 2021-09-01 DIAGNOSIS — R079 Chest pain, unspecified: Secondary | ICD-10-CM

## 2021-09-01 DIAGNOSIS — Z8679 Personal history of other diseases of the circulatory system: Secondary | ICD-10-CM

## 2021-09-01 DIAGNOSIS — I4729 Other ventricular tachycardia: Secondary | ICD-10-CM

## 2021-09-01 DIAGNOSIS — Z0289 Encounter for other administrative examinations: Secondary | ICD-10-CM

## 2021-09-01 NOTE — Telephone Encounter (Signed)
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 ?

## 2021-09-01 NOTE — Telephone Encounter (Signed)
Please schedule Norman Pearson for a plain treadmill stress test.  Should hold the metoprolol tartrate on the morning of the test. ?

## 2021-09-01 NOTE — Telephone Encounter (Signed)
Patient said that he needs to schedule a Maximum treadmill stress test stage 3 before appt with Dr. Gwenlyn Found. Please call back  ?

## 2021-09-02 NOTE — Addendum Note (Signed)
Addended by: Sanda Klein on: 09/02/2021 03:29 PM ? ? Modules accepted: Orders ? ?

## 2021-09-02 NOTE — Telephone Encounter (Signed)
Spoke to patient Dr.Croitoru advised ok to order plain treadmill test.Advised to hold Metoprolol morning of test.Scheduler will call back with appointment. ?

## 2021-09-16 ENCOUNTER — Telehealth: Payer: Self-pay | Admitting: Cardiovascular Disease

## 2021-09-16 NOTE — Telephone Encounter (Signed)
Pt c/o medication issue: ? ?1. Name of Medication: metoprolol tartrate (LOPRESSOR) 25 MG tablet ? ?2. How are you currently taking this medication (dosage and times per day)? As directed  ? ?3. Are you having a reaction (difficulty breathing--STAT)? no ? ?4. What is your medication issue? Patient wanted to know if he could hold the metoprolol for 48 hours prior to his treadmill stress test. ? 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 He has held  his medication for 36 hrs prior to doing a treadmill pre-test on his own and his HR only got to 107. HE would like to know if it is OK  to hold the medication just for the test  ? ?Please let the patient know what the office recommends  ?

## 2021-09-16 NOTE — Telephone Encounter (Addendum)
Pt is questioning if he can hold is metoprolol 48 hours prior to test vs. 24 hours. He state he would like to be able to hit target HR and doesn't fill 24 hours will be enough. ? ?Will forward to MD for recommendations.  ?

## 2021-09-16 NOTE — Telephone Encounter (Signed)
Pt made aware and verbalized understanding.  ? ?Lorretta Harp, MD  You 49 minutes ago (12:35 PM)  ? ?Okay to hold beta-blocker for 48 hours prior to the procedure.   ? ?

## 2021-09-19 ENCOUNTER — Telehealth (HOSPITAL_COMMUNITY): Payer: Self-pay | Admitting: *Deleted

## 2021-09-19 NOTE — Telephone Encounter (Signed)
Close encounter 

## 2021-09-23 ENCOUNTER — Other Ambulatory Visit: Payer: Self-pay

## 2021-09-23 ENCOUNTER — Telehealth (HOSPITAL_COMMUNITY): Payer: Self-pay | Admitting: *Deleted

## 2021-09-23 ENCOUNTER — Encounter (HOSPITAL_COMMUNITY): Payer: Self-pay

## 2021-09-23 ENCOUNTER — Ambulatory Visit (HOSPITAL_COMMUNITY)
Admission: RE | Admit: 2021-09-23 | Discharge: 2021-09-23 | Disposition: A | Discharge status: Home / Self Care | Payer: Medicare HMO | Source: Ambulatory Visit | Attending: Internal Medicine | Admitting: Internal Medicine

## 2021-09-23 DIAGNOSIS — I214 Non-ST elevation (NSTEMI) myocardial infarction: Secondary | ICD-10-CM

## 2021-09-23 DIAGNOSIS — I4729 Other ventricular tachycardia: Secondary | ICD-10-CM

## 2021-09-23 NOTE — Telephone Encounter (Signed)
Close encounter 

## 2021-09-24 ENCOUNTER — Ambulatory Visit (HOSPITAL_COMMUNITY)
Admission: RE | Admit: 2021-09-24 | Discharge: 2021-09-24 | Disposition: A | Discharge status: Home / Self Care | Payer: Medicare HMO | Source: Ambulatory Visit | Attending: Cardiovascular Disease | Admitting: Cardiovascular Disease

## 2021-09-24 DIAGNOSIS — I4729 Other ventricular tachycardia: Secondary | ICD-10-CM

## 2021-09-24 DIAGNOSIS — I214 Non-ST elevation (NSTEMI) myocardial infarction: Secondary | ICD-10-CM

## 2021-09-29 ENCOUNTER — Ambulatory Visit: Payer: Medicare HMO | Admitting: General Practice

## 2021-09-30 ENCOUNTER — Ambulatory Visit: Payer: Medicare HMO | Admitting: Cardiovascular Disease

## 2021-09-30 ENCOUNTER — Encounter: Payer: Self-pay | Admitting: Cardiovascular Disease

## 2021-09-30 VITALS — BP 108/72 | HR 58 | Ht 71.0 in | Wt 172.0 lb

## 2021-09-30 DIAGNOSIS — I712 Thoracic aortic aneurysm, without rupture, unspecified: Secondary | ICD-10-CM | POA: Insufficient documentation

## 2021-09-30 DIAGNOSIS — I7121 Aneurysm of the ascending aorta, without rupture: Secondary | ICD-10-CM | POA: Diagnosis not present

## 2021-09-30 DIAGNOSIS — I214 Non-ST elevation (NSTEMI) myocardial infarction: Secondary | ICD-10-CM

## 2021-09-30 DIAGNOSIS — I4729 Other ventricular tachycardia: Secondary | ICD-10-CM | POA: Diagnosis not present

## 2021-09-30 NOTE — Progress Notes (Signed)
? ? ? ?09/30/2021 ?Norman Pearson   ?August 29, 1950  ?[id number] ? ?Primary Physician Norman Solian, MD ?Primary Cardiologist: Norman Harp MD Norman Pearson, [place] ? ?HPI:  Norman Pearson is a 71 y.o. 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  [place].  I am seeing him back after his hospitalization in late December for non-STEMI.  He has no cardiovascular risk factors.  He had never had a myocardial infarction or stroke.  He did have COVID back in August of last year.  He developed arm and chest pain on 05/12/2021.  Apparently his granddaughters dog had died and they had buried it.  His EKG on admission showed no acute changes.  His cardiac enzymes elevated at 1168.  His 2D echo was normal.  Cardiac catheterization performed Dr. Angelena Pearson was normal as well with out obstructive disease.  Cardiac MRI showed mild myopericarditis with a small ascending thoracic aortic aneurysm measuring 40 mm of no clinical consequence.  He is very active, mows his own yard and splits his own wood.  He does have a treadmill at home but has difficulty because of knee issues.  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 ? ? ?Current Meds  ?Medication Sig  ? acetaminophen (TYLENOL) 500 MG tablet Take 500 mg by mouth daily as needed (pain).  ? AMBULATORY NON FORMULARY MEDICATION FD guard take 2 capsules twice daily 30-60 minutes before meals ? ?Lot #[id number] Exp 05/2021 x 7 boxes (Patient taking differently: Take 1 capsule by mouth 2 (two) times daily as needed (acid reflux/indigestion). FD guard  ? ?Lot #[id number] Exp 05/2021 x 7 boxes)  ? aspirin 81 MG chewable tablet Chew 1 tablet (81 mg total) by mouth daily.  ? Calcium Carbonate Antacid (TUMS PO) Take 0.5 tablets by mouth at bedtime as needed (indigestion/acid reflux).  ? flurazepam (DALMANE) 15 MG capsule Take 15 mg by mouth at  bedtime as needed for sleep.  ? HYDROcodone-acetaminophen (NORCO/VICODIN) 5-325 MG tablet Take 1 tablet by mouth daily as needed for severe pain. prn  ? loratadine (CLARITIN) 10 MG tablet Take 10 mg by mouth daily as needed (seasonal allergies (May)).  ? metoprolol tartrate (LOPRESSOR) 25 MG tablet Take 0.5 tablets (12.5 mg total) by mouth 2 (two) times daily.  ? MILK THISTLE PO Take 1 tablet by mouth daily.  ? Multiple Vitamin (MULTIVITAMIN WITH MINERALS) TABS tablet Take 1 tablet by mouth 2 (two) times a week.  ? olopatadine (PATANOL) 0.1 % ophthalmic solution Place 1 drop into both eyes 2 (two) times daily as needed (seasonal allergies (May)).  ? PEPPERMINT OIL PO Take 1 capsule by mouth every evening.  ? Saw Palmetto, Serenoa repens, (SAW PALMETTO PO) Take 1 tablet by mouth daily.  ?  ? ?Allergies  ?Allergen Reactions  ? Other Itching  ?  Grass pollen  ? Fentanyl Other (See Comments)  ?  "bad taste" reaction to patch  ? ? ?Social History  ? ?Socioeconomic History  ? Marital status: Married  ?  Spouse name: Not on file  ? Number of children: 1  ? Years of education: Not on file  ? Highest education level: Not on file  ?Occupational History  ? Not on file  ?Tobacco Use  ? Smoking status: Former  ?  Types: Cigarettes  ?  Quit date: 04/13/1974  ?  Years since quitting: 47.4  ? Smokeless tobacco: Former  ? Tobacco comments:  ?  quit over 50 years ago. Smoked 1 ppd for 3 years when young  ?Vaping Use  ? Vaping Use: Never used  ?Substance and Sexual Activity  ? Alcohol use: Yes  ?  Alcohol/week: 3.0 standard drinks  ?  Types: 3 Standard drinks or equivalent per week  ?  Comment: 1 oz of liquor 2-3 times a week  ? Drug use: No  ?  Comment: remote use of cocaine when young  ? Sexual activity: Not on file  ?Other Topics Concern  ? Not on file  ?Social History Narrative  ? Not on file  ? ?Social Determinants of Health  ? ?Financial Resource Strain: Not on file  ?Food Insecurity: Not on file  ?Transportation Needs: Not on  file  ?Physical Activity: Not on file  ?Stress: Not on file  ?Social Connections: Not on file  ?Intimate Partner Violence: Not on file  ?  ? ?Review of Systems: ?General: negative for chills, fever, night sweats or weight changes.  ?Cardiovascular: negative for chest pain, dyspnea on exertion, edema, orthopnea, palpitations, paroxysmal nocturnal dyspnea or shortness of breath ?Dermatological: negative for rash ?Respiratory: negative for cough or wheezing ?Urologic: negative for hematuria ?Abdominal: negative for nausea, vomiting, diarrhea, bright red blood per rectum, melena, or hematemesis ?Neurologic: negative for visual changes, syncope, or dizziness ?All other systems reviewed and are otherwise negative except as noted above. ? ? ? ?Blood pressure 108/72, pulse (!) 58, height '5\' 11"'$  (1.803 m), weight 172 lb (78 kg).  ?General appearance: alert and no distress ?Neck: no adenopathy, no carotid bruit, no JVD, supple, symmetrical, trachea midline, and thyroid not enlarged, symmetric, no tenderness/mass/nodules ?Lungs: clear to auscultation bilaterally ?Heart: regular rate and rhythm, S1, S2 normal, no murmur, click, rub or gallop ?Extremities: extremities normal, atraumatic, no cyanosis or edema ?Pulses: 2+ and symmetric ?Skin: Skin color, texture, turgor normal. No rashes or lesions ?Neurologic: Grossly normal ? ?EKG sinus bradycardia 58 with nonspecific ST and T wave changes.  I personally reviewed this EKG. ? ?ASSESSMENT AND PLAN:  ? ?NSTEMI (non-ST elevated myocardial infarction) (Norman Pearson) ?Mr. Norman Pearson was admitted to the hospital on 05/12/2021 with chest and arm pain.  His EKG showed no acute changes.  His troponins were positive at approximately thousand.  He underwent left heart cath by Dr. Angelena Pearson revealing normal coronary arteries and normal LV function.  2D echo likewise was normal.  Cardiac MRI showed mild myopericarditis.  He did have some short runs of nonsustained ventricular tachycardia and was seen by  Dr. Lovena Pearson, EP, who recommended beta-blockade.  He has had no cardiovascular issues since.  He is very active, mows his own yard and splits wood without symptoms. ? ?Thoracic aortic aneurysm ([place]) ?Cardiac MRI showed a small ascending thoracic aortic aneurysm measuring 40 mm.  This is of no clinical consequence and will be followed by annual imaging. ? ? ? ? ?Norman Harp MD FACP,FACC,FAHA, FSCAI ?09/30/2021 ?8:44 AM ?

## 2021-09-30 NOTE — Patient Instructions (Signed)
Medication Instructions:  ?The current medical regimen is effective;  continue present plan and medications. ? ?*If you need a refill on your cardiac medications before your next appointment, please call your pharmacy* ? ? ?Follow-Up: ?At [facility], you and your health needs are our priority.  As part of our continuing mission to provide you with exceptional heart care, we have created designated Provider Care Teams.  These Care Teams include your primary Cardiologist (physician) and Advanced Practice Providers (APPs -  Physician Assistants and Nurse Practitioners) who all work together to provide you with the care you need, when you need it. ? ?We recommend signing up for the patient portal called "MyChart".  Sign up information is provided on this After Visit Summary.  MyChart is used to connect with patients for Virtual Visits (Telemedicine).  Patients are able to view lab/test results, encounter notes, upcoming appointments, etc.  Non-urgent messages can be sent to your provider as well.   ?To learn more about what you can do with MyChart, go to [email or web address].   ? ?Your next appointment:   ?6 month(s) ? ?The format for your next appointment:   ?In Person ? ?Provider:   ?Quay Burow, MD  ? ? ? ? ? ? ? ?

## 2021-09-30 NOTE — Assessment & Plan Note (Signed)
Cardiac MRI showed a small ascending thoracic aortic aneurysm measuring 40 mm.  This is of no clinical consequence and will be followed by annual imaging. ?

## 2021-09-30 NOTE — Assessment & Plan Note (Signed)
Norman Pearson was admitted to the hospital on 05/12/2021 with chest and arm pain.  His EKG showed no acute changes.  His troponins were positive at approximately thousand.  He underwent left heart cath by Dr. Angelena Form revealing normal coronary arteries and normal LV function.  2D echo likewise was normal.  Cardiac MRI showed mild myopericarditis.  He did have some short runs of nonsustained ventricular tachycardia and was seen by Dr. Lovena Le, EP, who recommended beta-blockade.  He has had no cardiovascular issues since.  He is very active, mows his own yard and splits wood without symptoms. ?

## 2021-10-07 ENCOUNTER — Telehealth (HOSPITAL_COMMUNITY): Payer: Self-pay | Admitting: *Deleted

## 2021-10-07 NOTE — Telephone Encounter (Signed)
Close encounter 

## 2021-10-08 ENCOUNTER — Ambulatory Visit (HOSPITAL_COMMUNITY)
Admission: RE | Admit: 2021-10-08 | Discharge: 2021-10-08 | Disposition: A | Discharge status: Home / Self Care | Payer: Medicare HMO | Source: Ambulatory Visit | Attending: Cardiology | Admitting: Cardiology

## 2021-10-08 DIAGNOSIS — I4729 Other ventricular tachycardia: Secondary | ICD-10-CM | POA: Insufficient documentation

## 2021-10-08 DIAGNOSIS — I214 Non-ST elevation (NSTEMI) myocardial infarction: Secondary | ICD-10-CM | POA: Insufficient documentation

## 2021-10-08 LAB — MYOCARDIAL PERFUSION IMAGING
LV dias vol: 125 mL (ref 62–150)
LV sys vol: 57 mL
Nuc Stress EF: 54 %
Peak HR: 89 {beats}/min
Rest HR: 55 {beats}/min
Rest Nuclear Isotope Dose: 10.9 mCi
ST Depression (mm): 0 mm
Stress Nuclear Isotope Dose: 31.8 mCi
TID: 1.05

## 2021-10-08 MED ORDER — TECHNETIUM TC 99M TETROFOSMIN IV KIT
10.9000 | PACK | Freq: Once | INTRAVENOUS | Status: AC | PRN
  Administered 2021-10-08: 10.9 via INTRAVENOUS

## 2021-10-08 MED ORDER — REGADENOSON 0.4 MG/5ML IV SOLN
0.4000 mg | Freq: Once | INTRAVENOUS | Status: AC
  Administered 2021-10-08: 0.4 mg via INTRAVENOUS

## 2021-10-08 MED ORDER — TECHNETIUM TC 99M TETROFOSMIN IV KIT
31.8000 | PACK | Freq: Once | INTRAVENOUS | Status: AC | PRN
  Administered 2021-10-08: 31.8 via INTRAVENOUS

## 2021-10-09 ENCOUNTER — Encounter: Payer: Self-pay | Admitting: Cardiovascular Disease

## 2021-10-09 ENCOUNTER — Telehealth: Payer: Self-pay | Admitting: Cardiovascular Disease

## 2021-10-09 NOTE — Telephone Encounter (Signed)
Patient requesting call back to discuss the paper work needed for the FFA and the results from the [place].

## 2021-10-09 NOTE — Telephone Encounter (Signed)
Called patient, he states these are the things needed for his FFA.   1- BP readings from both arms from previous visit.  2- ECHO and STRESS results  3- EKG reading 4- Family history, and his history before his heart issues. 5- Some type of note of fitness that he is completing to maintain, and Dr.Berry's assessment and functional capacity. 6- Prognosis  7- Medication with dosages    Will route to  MD/RN

## 2021-10-14 ENCOUNTER — Encounter: Payer: Self-pay | Admitting: Cardiovascular Disease

## 2021-10-15 NOTE — Telephone Encounter (Signed)
Paperwork printed and placed at front desk for pt to pick up. Refer to [facility] encounter for more details.

## 2021-11-07 DIAGNOSIS — H02055 Trichiasis without entropian left lower eyelid: Secondary | ICD-10-CM | POA: Diagnosis not present

## 2021-11-07 DIAGNOSIS — H02052 Trichiasis without entropian right lower eyelid: Secondary | ICD-10-CM | POA: Diagnosis not present

## 2022-01-05 ENCOUNTER — Other Ambulatory Visit: Payer: Self-pay | Admitting: General Practice

## 2022-01-12 DIAGNOSIS — K219 Gastro-esophageal reflux disease without esophagitis: Secondary | ICD-10-CM | POA: Diagnosis not present

## 2022-01-12 DIAGNOSIS — M5416 Radiculopathy, lumbar region: Secondary | ICD-10-CM | POA: Diagnosis not present

## 2022-01-12 DIAGNOSIS — N400 Enlarged prostate without lower urinary tract symptoms: Secondary | ICD-10-CM | POA: Diagnosis not present

## 2022-01-12 DIAGNOSIS — G629 Polyneuropathy, unspecified: Secondary | ICD-10-CM | POA: Diagnosis not present

## 2022-01-12 DIAGNOSIS — I214 Non-ST elevation (NSTEMI) myocardial infarction: Secondary | ICD-10-CM | POA: Diagnosis not present

## 2022-01-12 DIAGNOSIS — M19079 Primary osteoarthritis, unspecified ankle and foot: Secondary | ICD-10-CM | POA: Diagnosis not present

## 2022-01-12 DIAGNOSIS — G4700 Insomnia, unspecified: Secondary | ICD-10-CM | POA: Diagnosis not present

## 2022-01-21 DIAGNOSIS — C44629 Squamous cell carcinoma of skin of left upper limb, including shoulder: Secondary | ICD-10-CM | POA: Diagnosis not present

## 2022-02-02 DIAGNOSIS — H02052 Trichiasis without entropian right lower eyelid: Secondary | ICD-10-CM | POA: Diagnosis not present

## 2022-02-02 DIAGNOSIS — H35361 Drusen (degenerative) of macula, right eye: Secondary | ICD-10-CM | POA: Diagnosis not present

## 2022-02-02 DIAGNOSIS — H25813 Combined forms of age-related cataract, bilateral: Secondary | ICD-10-CM | POA: Diagnosis not present

## 2022-02-02 DIAGNOSIS — D3131 Benign neoplasm of right choroid: Secondary | ICD-10-CM | POA: Diagnosis not present

## 2022-02-02 DIAGNOSIS — H02055 Trichiasis without entropian left lower eyelid: Secondary | ICD-10-CM | POA: Diagnosis not present

## 2022-02-02 DIAGNOSIS — H353111 Nonexudative age-related macular degeneration, right eye, early dry stage: Secondary | ICD-10-CM | POA: Diagnosis not present

## 2022-04-07 ENCOUNTER — Ambulatory Visit: Payer: Medicare HMO | Admitting: Cardiovascular Disease

## 2022-06-05 ENCOUNTER — Ambulatory Visit: Payer: Medicare HMO | Attending: Cardiovascular Disease | Admitting: Cardiovascular Disease

## 2022-06-05 ENCOUNTER — Encounter: Payer: Self-pay | Admitting: Cardiovascular Disease

## 2022-06-05 VITALS — BP 120/78 | HR 56 | Ht 71.0 in | Wt 174.6 lb

## 2022-06-05 DIAGNOSIS — I7121 Aneurysm of the ascending aorta, without rupture: Secondary | ICD-10-CM

## 2022-06-05 DIAGNOSIS — I214 Non-ST elevation (NSTEMI) myocardial infarction: Secondary | ICD-10-CM | POA: Diagnosis not present

## 2022-06-05 NOTE — Patient Instructions (Addendum)
Medication Instructions:  No Changes In Medications at this time.  *If you need a refill on your cardiac medications before your next appointment, please call your pharmacy*  Lab Work: None Ordered At This Time.  If you have labs (blood work) drawn today and your tests are completely normal, you will receive your results only by: [place] (if you have MyChart) OR A paper copy in the mail If you have any lab test that is abnormal or we need to change your treatment, we will call you to review the results.  Testing/Procedures: Your physician has requested that you have an echocardiogram. Echocardiography is a painless test that uses sound waves to create images of your heart. It provides your doctor with information about the size and shape of your heart and how well your heart's chambers and valves are working. You may receive an ultrasound enhancing agent through an IV if needed to better visualize your heart during the echo.This procedure takes approximately one hour. There are no restrictions for this procedure. This will take place at the [address].    Follow-Up: At [facility], you and your health needs are our priority.  As part of our continuing mission to provide you with exceptional heart care, we have created designated Provider Care Teams.  These Care Teams include your primary Cardiologist (physician) and Advanced Practice Providers (APPs -  Physician Assistants and Nurse Practitioners) who all work together to provide you with the care you need, when you need it.  Your next appointment:   1 year(s)  Provider:   Quay Burow, MD

## 2022-06-05 NOTE — Assessment & Plan Note (Signed)
History of non-STEMI 05/12/2021.  Catheterization by Dr. Angelena Form revealed normal coronary arteries.  Cardiac MRI showed myopericarditis small ascending thoracic aortic aneurysm.  He is completely asymptomatic.

## 2022-06-05 NOTE — Assessment & Plan Note (Signed)
Small ascending thoracic aortic aneurysm measuring 40 mm by 2D echo 05/13/2021.  This will be followed by serial annual 2D's

## 2022-06-05 NOTE — Progress Notes (Signed)
06/05/2022 AYMAN BRULL   1951/02/01  [id number]  Primary Physician Prince Solian, MD Primary Cardiologist: Lorretta Harp MD Renae Gloss  HPI:  Norman Pearson is a 72 y.o.   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  [place].  I saw him back after his hospitalization in late December for non-STEMI.  I last saw him in the office 09/30/2021.  He has no cardiovascular risk factors.  He had never had a myocardial infarction or stroke.  He did have COVID back in August of last year.  He developed arm and chest pain on 05/12/2021.  Apparently his granddaughters dog had died and they had buried it.  His EKG on admission showed no acute changes.  His cardiac enzymes elevated at 1168.  His 2D echo was normal.  Cardiac catheterization performed Dr. Angelena Form was normal as well with out obstructive disease.  Cardiac MRI showed mild myopericarditis with a small ascending thoracic aortic aneurysm measuring 40 mm of no clinical consequence.  He is very active, mows his own yard and splits his own wood.  He does have a treadmill at home but has difficulty because of knee issues.  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  He is very active and denies chest pain or shortness of breath.   Current Meds  Medication Sig   acetaminophen (TYLENOL) 500 MG tablet Take 500 mg by mouth daily as needed (pain).   AMBULATORY NON FORMULARY MEDICATION FD guard take 2 capsules twice daily 30-60 minutes before meals  Lot #[id number] Exp 05/2021 x 7 boxes (Patient taking differently: Take 1 capsule by mouth 2 (two) times daily as needed (acid reflux/indigestion). FD guard   Lot #[id number] Exp 05/2021 x 7 boxes)   aspirin 81 MG chewable  tablet Chew 1 tablet (81 mg total) by mouth daily.   Calcium Carbonate Antacid (TUMS PO) Take 0.5 tablets by mouth at bedtime as needed (indigestion/acid reflux).   loratadine (CLARITIN) 10 MG tablet Take 10 mg by mouth daily as needed (seasonal allergies (May)).   metoprolol tartrate (LOPRESSOR) 25 MG tablet Take 0.5 tablets (12.5 mg total) by mouth 2 (two) times daily.   MILK THISTLE PO Take 1 tablet by mouth daily.   Multiple Vitamin (MULTIVITAMIN WITH MINERALS) TABS tablet Take 1 tablet by mouth 2 (two) times a week.   olopatadine (PATANOL) 0.1 % ophthalmic solution Place 1 drop into both eyes 2 (two) times daily as needed (seasonal allergies (May)).   PEPPERMINT OIL PO Take 1 capsule by mouth every evening.   Saw Palmetto, Serenoa repens, (SAW PALMETTO PO) Take 1 tablet by mouth daily.     Allergies  Allergen Reactions   Other Itching    Grass pollen   Fentanyl Other (See Comments)    "bad taste" reaction to patch    Social History   Socioeconomic History   Marital status: Married    Spouse name: Not on file   Number of children: 1   Years of education: Not on file   Highest education level: Not on file  Occupational History   Not on file  Tobacco Use   Smoking status: Former    Types: Cigarettes  Quit date: 04/13/1974    Years since quitting: 48.1   Smokeless tobacco: Former   Tobacco comments:    quit over 50 years ago. Smoked 1 ppd for 3 years when young  Vaping Use   Vaping Use: Never used  Substance and Sexual Activity   Alcohol use: Yes    Alcohol/week: 3.0 standard drinks of alcohol    Types: 3 Standard drinks or equivalent per week    Comment: 1 oz of liquor 2-3 times a week   Drug use: No    Comment: remote use of cocaine when young   Sexual activity: Not on file  Other Topics Concern   Not on file  Social History Narrative   Not on file   Social Determinants of Health   Financial Resource Strain: Not on file  Food Insecurity: Not on file   Transportation Needs: Not on file  Physical Activity: Not on file  Stress: Not on file  Social Connections: Not on file  Intimate Partner Violence: Not on file     Review of Systems: General: negative for chills, fever, night sweats or weight changes.  Cardiovascular: negative for chest pain, dyspnea on exertion, edema, orthopnea, palpitations, paroxysmal nocturnal dyspnea or shortness of breath Dermatological: negative for rash Respiratory: negative for cough or wheezing Urologic: negative for hematuria Abdominal: negative for nausea, vomiting, diarrhea, bright red blood per rectum, melena, or hematemesis Neurologic: negative for visual changes, syncope, or dizziness All other systems reviewed and are otherwise negative except as noted above.    Blood pressure 120/78, pulse (!) 56, height '5\' 11"'$  (1.803 m), weight 174 lb 9.6 oz (79.2 kg), SpO2 96 %.  General appearance: alert and no distress Neck: no adenopathy, no carotid bruit, no JVD, supple, symmetrical, trachea midline, and thyroid not enlarged, symmetric, no tenderness/mass/nodules Lungs: clear to auscultation bilaterally Heart: regular rate and rhythm, S1, S2 normal, no murmur, click, rub or gallop Extremities: extremities normal, atraumatic, no cyanosis or edema Pulses: 2+ and symmetric Skin: Skin color, texture, turgor normal. No rashes or lesions Neurologic: Grossly normal  EKG sinus bradycardia 56 with nonspecific ST and T wave changes.  Personally reviewed this EKG.  ASSESSMENT AND PLAN:   NSTEMI (non-ST elevated myocardial infarction) ([place]) History of non-STEMI 05/12/2021.  Catheterization by Dr. Angelena Form revealed normal coronary arteries.  Cardiac MRI showed myopericarditis small ascending thoracic aortic aneurysm.  He is completely asymptomatic.  Thoracic aortic aneurysm ([facility]) Small ascending thoracic aortic aneurysm measuring 40 mm by 2D echo 05/13/2021.  This will be followed by serial annual  2D's     Lorretta Harp MD [facility] 06/05/2022 8:37 AM

## 2022-06-08 ENCOUNTER — Ambulatory Visit (HOSPITAL_COMMUNITY): Payer: Medicare HMO | Attending: Cardiology

## 2022-06-08 DIAGNOSIS — I7121 Aneurysm of the ascending aorta, without rupture: Secondary | ICD-10-CM | POA: Insufficient documentation

## 2022-06-08 LAB — ECHOCARDIOGRAM COMPLETE
Area-P 1/2: 3.15 cm2
S' Lateral: 2.5 cm

## 2022-06-09 ENCOUNTER — Telehealth: Payer: Self-pay | Admitting: Cardiovascular Disease

## 2022-06-09 NOTE — Telephone Encounter (Signed)
Pt is requesting return call to discuss echo results.

## 2022-06-09 NOTE — Telephone Encounter (Signed)
Called/left message to call back for results.

## 2022-06-09 NOTE — Telephone Encounter (Signed)
The patient has been notified of the result and verbalized understanding.  All questions (if any) were answered. Raiford Simmonds, RN 06/09/2022 3:09 PM

## 2022-06-09 NOTE — Telephone Encounter (Signed)
  Lorretta Harp, MD 06/08/2022  7:56 PM EST     Nl LV systolic FXN, G2RK, mild ASH, mild dilatation of ascending thoracic Ao. Repeat 12 months

## 2022-06-09 NOTE — Telephone Encounter (Signed)
Documented on result.  Thank you!

## 2022-06-28 ENCOUNTER — Other Ambulatory Visit: Payer: Self-pay | Admitting: General Practice

## 2022-08-03 DIAGNOSIS — M9903 Segmental and somatic dysfunction of lumbar region: Secondary | ICD-10-CM | POA: Diagnosis not present

## 2022-08-03 DIAGNOSIS — M5136 Other intervertebral disc degeneration, lumbar region: Secondary | ICD-10-CM | POA: Diagnosis not present

## 2022-08-03 DIAGNOSIS — M9904 Segmental and somatic dysfunction of sacral region: Secondary | ICD-10-CM | POA: Diagnosis not present

## 2022-08-03 DIAGNOSIS — M9905 Segmental and somatic dysfunction of pelvic region: Secondary | ICD-10-CM | POA: Diagnosis not present

## 2022-08-06 DIAGNOSIS — M5136 Other intervertebral disc degeneration, lumbar region: Secondary | ICD-10-CM | POA: Diagnosis not present

## 2022-08-06 DIAGNOSIS — M9903 Segmental and somatic dysfunction of lumbar region: Secondary | ICD-10-CM | POA: Diagnosis not present

## 2022-08-06 DIAGNOSIS — M9905 Segmental and somatic dysfunction of pelvic region: Secondary | ICD-10-CM | POA: Diagnosis not present

## 2022-08-06 DIAGNOSIS — M9904 Segmental and somatic dysfunction of sacral region: Secondary | ICD-10-CM | POA: Diagnosis not present

## 2022-08-26 DIAGNOSIS — H2513 Age-related nuclear cataract, bilateral: Secondary | ICD-10-CM | POA: Diagnosis not present

## 2022-08-26 DIAGNOSIS — H02052 Trichiasis without entropian right lower eyelid: Secondary | ICD-10-CM | POA: Diagnosis not present

## 2022-08-26 DIAGNOSIS — H52203 Unspecified astigmatism, bilateral: Secondary | ICD-10-CM | POA: Diagnosis not present

## 2022-08-26 DIAGNOSIS — H02055 Trichiasis without entropian left lower eyelid: Secondary | ICD-10-CM | POA: Diagnosis not present

## 2022-08-26 DIAGNOSIS — D3131 Benign neoplasm of right choroid: Secondary | ICD-10-CM | POA: Diagnosis not present

## 2022-09-02 DIAGNOSIS — K219 Gastro-esophageal reflux disease without esophagitis: Secondary | ICD-10-CM | POA: Diagnosis not present

## 2022-09-02 DIAGNOSIS — Z125 Encounter for screening for malignant neoplasm of prostate: Secondary | ICD-10-CM | POA: Diagnosis not present

## 2022-09-02 DIAGNOSIS — Z79899 Other long term (current) drug therapy: Secondary | ICD-10-CM | POA: Diagnosis not present

## 2022-09-02 DIAGNOSIS — Z1212 Encounter for screening for malignant neoplasm of rectum: Secondary | ICD-10-CM | POA: Diagnosis not present

## 2022-09-09 DIAGNOSIS — R82998 Other abnormal findings in urine: Secondary | ICD-10-CM | POA: Diagnosis not present

## 2022-09-09 DIAGNOSIS — I214 Non-ST elevation (NSTEMI) myocardial infarction: Secondary | ICD-10-CM | POA: Diagnosis not present

## 2022-09-09 DIAGNOSIS — M5416 Radiculopathy, lumbar region: Secondary | ICD-10-CM | POA: Diagnosis not present

## 2022-09-09 DIAGNOSIS — M19079 Primary osteoarthritis, unspecified ankle and foot: Secondary | ICD-10-CM | POA: Diagnosis not present

## 2022-09-09 DIAGNOSIS — Z1331 Encounter for screening for depression: Secondary | ICD-10-CM | POA: Diagnosis not present

## 2022-09-09 DIAGNOSIS — Z1339 Encounter for screening examination for other mental health and behavioral disorders: Secondary | ICD-10-CM | POA: Diagnosis not present

## 2022-09-09 DIAGNOSIS — Z0000 Encounter for general adult medical examination without abnormal findings: Secondary | ICD-10-CM | POA: Diagnosis not present

## 2022-09-09 DIAGNOSIS — G629 Polyneuropathy, unspecified: Secondary | ICD-10-CM | POA: Diagnosis not present

## 2022-09-09 DIAGNOSIS — K219 Gastro-esophageal reflux disease without esophagitis: Secondary | ICD-10-CM | POA: Diagnosis not present

## 2022-09-16 DIAGNOSIS — D225 Melanocytic nevi of trunk: Secondary | ICD-10-CM | POA: Diagnosis not present

## 2022-09-16 DIAGNOSIS — X32XXXD Exposure to sunlight, subsequent encounter: Secondary | ICD-10-CM | POA: Diagnosis not present

## 2022-09-16 DIAGNOSIS — L570 Actinic keratosis: Secondary | ICD-10-CM | POA: Diagnosis not present

## 2022-10-09 DIAGNOSIS — R3912 Poor urinary stream: Secondary | ICD-10-CM | POA: Diagnosis not present

## 2022-10-09 DIAGNOSIS — N401 Enlarged prostate with lower urinary tract symptoms: Secondary | ICD-10-CM | POA: Diagnosis not present

## 2022-10-18 IMAGING — MR MR CARD MORPHOLOGY WO/W CM
45 of 48 series · 45 of 48 positions shown · IV contrast (Contrast agent)
Comparison: none

CLINICAL DATA: Clinical question of myocarditis
Study assumes HCT of 49.9  And BSA of 1.9 m2.

EXAM:
CARDIAC MRI
TECHNIQUE: The patient was scanned on a 1.5 Tesla GE magnet. A dedicated
cardiac coil was used. Functional imaging was done using Fiesta
sequences. [DATE], and 4 chamber views were done to assess for RWMA's.
Modified Schott rule using a short axis stack was used to
calculate an ejection fraction on a dedicated work station using
Circle software. The patient received 10 cc of Gadavist. After 10
minutes inversion recovery sequences were used to assess for
infiltration and scar tissue.
CONTRAST:  10 cc  of Gadavist

[Series 4: t2_[id number]_bh · axial · 8.0mm · 1.48mm/px · 1 of 20 slices shown]
[im 1/20]
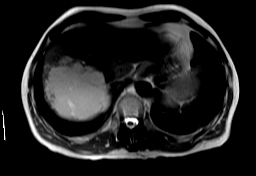

[Series 8: bSSFP · oblique · 8.0mm · 1.70mm/px · 1 of 25 slices shown (1 of 20)]
[im 1/25]
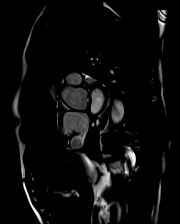

[Series 9: bSSFP · oblique · 8.0mm · 1.70mm/px · 1 of 25 slices shown (2 of 20)]
[im 1/25]
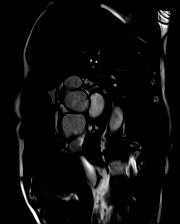

[Series 10: bSSFP · oblique · 8.0mm · 1.70mm/px · 1 of 25 slices shown (3 of 20)]
[im 1/25]
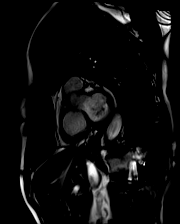

[Series 11: bSSFP · oblique · 8.0mm · 1.70mm/px · 1 of 25 slices shown (4 of 20)]
[im 1/25]
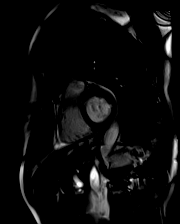

[Series 12: bSSFP · oblique · 8.0mm · 1.70mm/px · 1 of 25 slices shown (5 of 20)]
[im 1/25]
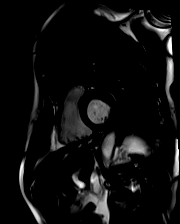

[Series 13: bSSFP · oblique · 8.0mm · 1.70mm/px · 1 of 25 slices shown (6 of 20)]
[im 1/25]
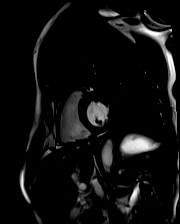

[Series 14: bSSFP · oblique · 8.0mm · 1.70mm/px · 1 of 25 slices shown (7 of 20)]
[im 1/25]
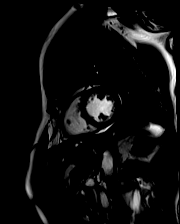

[Series 15: bSSFP · oblique · 8.0mm · 1.70mm/px · 1 of 25 slices shown (8 of 20)]
[im 1/25]
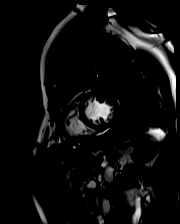

[Series 16: bSSFP · oblique · 8.0mm · 1.70mm/px · 1 of 25 slices shown (9 of 20)]
[im 1/25]
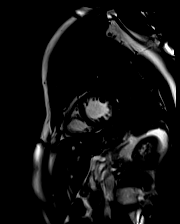

[Series 17: bSSFP · oblique · 8.0mm · 1.70mm/px · 1 of 25 slices shown (10 of 20)]
[im 1/25]
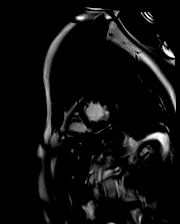

[Series 18: bSSFP · oblique · 8.0mm · 1.70mm/px · 1 of 25 slices shown (11 of 20)]
[im 1/25]
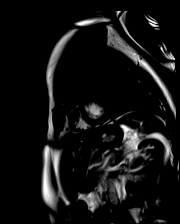

[Series 19: bSSFP · oblique · 8.0mm · 1.70mm/px · 1 of 25 slices shown (12 of 20)]
[im 1/25]
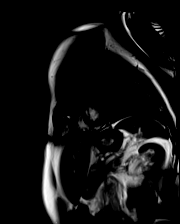

[Series 20: bSSFP · oblique · 8.0mm · 1.70mm/px · 1 of 25 slices shown (13 of 20)]
[im 1/25]
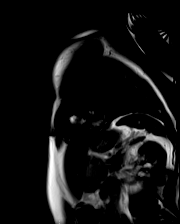

[Series 21: bSSFP · oblique · 8.0mm · 1.70mm/px · 1 of 25 slices shown (14 of 20)]
[im 1/25]
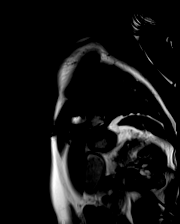

[Series 22: bSSFP · oblique · 8.0mm · 1.70mm/px · 1 of 25 slices shown (15 of 20)]
[im 1/25]
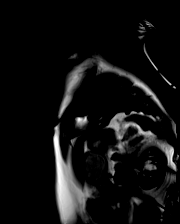

[Series 23: bSSFP · oblique · 8.0mm · 1.70mm/px · 1 of 25 slices shown (16 of 20)]
[im 1/25]
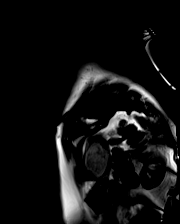

[Series 24: STIR · oblique · 8.0mm · 1.92mm/px · 1 of 15 slices shown (1 of 2)]
[im 1/15]
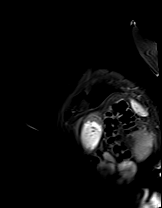

[Series 27: STIR · axial · 8.0mm · 1.92mm/px · 1 of 1 slices shown (2 of 2)]
[im 1/1]
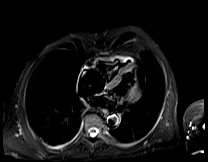

[Series 29: bSSFP · sagittal · 8.0mm · 1.41mm/px · 1 of 25 slices shown (17 of 20)]
[im 1/25]
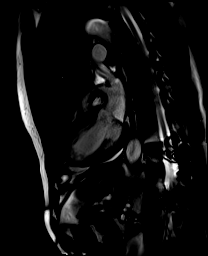

[Series 30: bSSFP · oblique · 8.0mm · 1.41mm/px · 1 of 25 slices shown (18 of 20)]
[im 1/25]
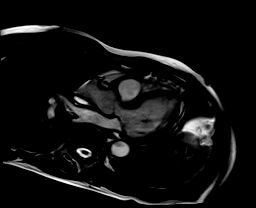

[Series 31: bSSFP · axial · 8.0mm · 1.41mm/px · 1 of 25 slices shown (19 of 20)]
[im 1/25]
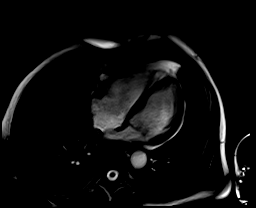

[Series 32: (id)_long_t1 · oblique · 8.0mm · 1.56mm/px · 1 of 24 slices shown]
[im 1/24]
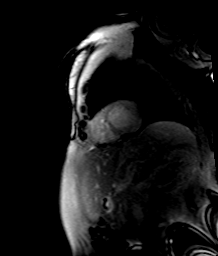

[Series 33: (id)_long_t1_moco · oblique · 8.0mm · 1.56mm/px · 1 of 24 slices shown]
[im 1/24]
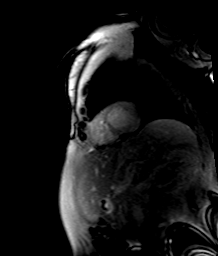

[Series 34: (id)_long_t1_moco_t1 · oblique · 8.0mm · 1.56mm/px · 1 of 3 slices shown (1 of 2)]
[im 1/3]
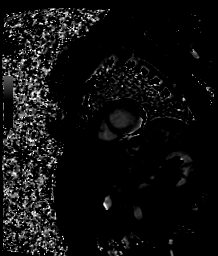

[Series 34: (id)_long_t1_moco_t1 · 1 of 3 slices shown (2 of 2)]
[im 1/3]
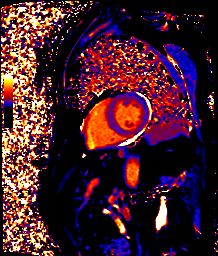

[Series 36: (id)_trufi · oblique · 8.0mm · 2.08mm/px · 1 of 9 slices shown]
[im 1/9]
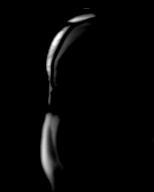

[Series 37: (id)_trufi_moco · oblique · 8.0mm · 2.08mm/px · 1 of 9 slices shown]
[im 1/9]
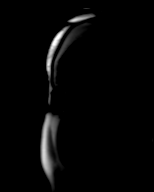

[Series 38: (id)_trufi_moco_t2 · oblique · 8.0mm · 2.08mm/px · 1 of 3 slices shown]
[im 1/3]
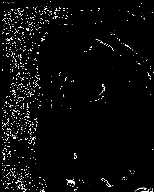

[Series 41: pre short axis · oblique · non-contrast · 8.0mm · 2.38mm/px · 1 of 10 slices shown (1 of 6)]
[im 1/10]
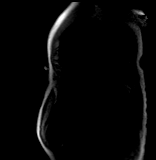

[Series 42: pre short axis · oblique · non-contrast · 8.0mm · 2.38mm/px · 1 of 10 slices shown (2 of 6)]
[im 1/10]
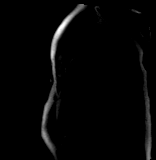

[Series 43: pre short axis · oblique · non-contrast · 8.0mm · 2.38mm/px · 1 of 10 slices shown (3 of 6)]
[im 1/10]
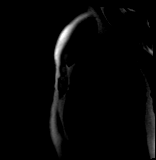

[Series 44: pre short axis · oblique · non-contrast · 8.0mm · 2.38mm/px · 1 of 10 slices shown (4 of 6)]
[im 1/10]
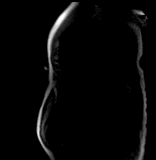

[Series 45: pre short axis · oblique · non-contrast · 8.0mm · 2.38mm/px · 1 of 10 slices shown (5 of 6)]
[im 1/10]
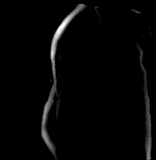

[Series 46: pre short axis · oblique · non-contrast · 8.0mm · 2.38mm/px · 1 of 10 slices shown (6 of 6)]
[im 1/10]
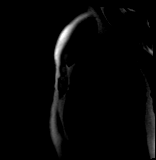

[Series 47: rest short axis · oblique · 8.0mm · 2.38mm/px · 1 of 60 slices shown (1 of 6)]
[im 1/60]
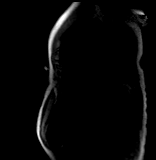

[Series 48: rest short axis · oblique · 8.0mm · 2.38mm/px · 1 of 60 slices shown (2 of 6)]
[im 1/60]
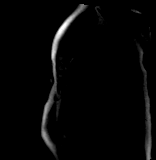

[Series 49: rest short axis · oblique · 8.0mm · 2.38mm/px · 1 of 60 slices shown (3 of 6)]
[im 1/60]
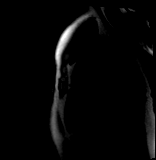

[Series 50: rest short axis · oblique · 8.0mm · 2.38mm/px · 1 of 60 slices shown (4 of 6)]
[im 1/60]
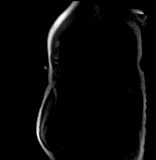

[Series 51: rest short axis · oblique · 8.0mm · 2.38mm/px · 1 of 60 slices shown (5 of 6)]
[im 1/60]
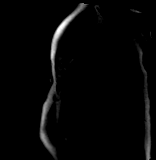

[Series 52: rest short axis · oblique · 8.0mm · 2.38mm/px · 1 of 60 slices shown (6 of 6)]
[im 1/60]
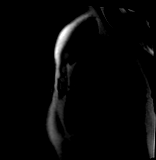

[Series 53: bSSFP · coronal · 6.0mm · 1.41mm/px · 1 of 25 slices shown (20 of 20)]
[im 1/25]
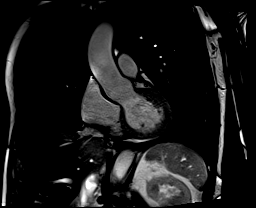

[Series 54: cine rvit · oblique · 6.0mm · 1.41mm/px · 1 of 25 slices shown]
[im 1/25]
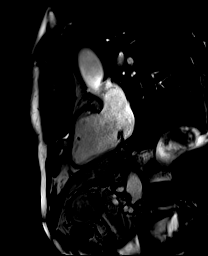

[Series 55: aortic valve cine · oblique · 6.0mm · 1.41mm/px · 1 of 25 slices shown]
[im 1/25]
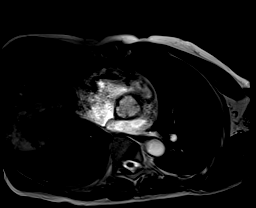

[Series 56: cine rvot · sagittal · 6.0mm · 1.41mm/px · 1 of 25 slices shown]
[im 1/25]
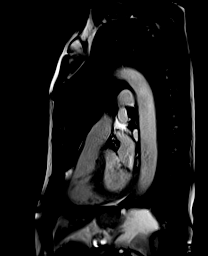

[45 of 48 positions shown; findings below may reference images not displayed]

FINDINGS: 1. Normal left ventricular size, with LVEDD 44 mm, and LVEDVi 69
mL/m2.

Normal left ventricular thickness, with intraventricular septal
thickness of 10 mm, posterior wall thickness of 9 mm, and myocardial
mass index of 55 g/m2.

Normal left ventricular systolic function (LVEF =57%). There are no
regional wall motion abnormalities.

Left ventricular parametric mapping notable for Significant increase
in the ECG in the apex (septal, inferior, lateral- 30-45%), the mid
(inferior and inferolateral- 40-50%) and basal inferolateral (75%).
There is significant increase in T2 signal in the mid (inferior and
inferolateral 60-70%) and base (inferior and inferolateral 60-69%).

There is late gadolinium enhancement in the left ventricular
myocardium: Patchy in the mid inferior and inferolateral.

2. Normal right ventricular size with RVEDVI 84 mL/m2.

Normal right ventricular thickness.

Normal right ventricular systolic function (RVEF =49%). There are no
regional wall motion abnormalities or aneurysms.

3.  Normal left and right atrial size.

4.  Normal size of the pulmonary artery

Mild aortic root dilation 40 mm.

Mild thoracic aortic aneurysm 42 mm superior to the level of the
main pulmonary artery bifurcation.

5. Valve assessment:

Aortic Valve: Tri-leaflet aortic valve. Qualitatively no significant
regurgitation.

Pulmonic Valve: Qualitatively no significant regurgitation.

Tricuspid Valve: Qualitatively no significant regurgitation.

Mitral Valve: There is mild bileaflet prolapse without significant
leaflet thickening and with no significant regurgitation.

6. Small pericardial effusion posterior to the left ventricle. There
is no pericardial thickening. There is slight pericardial
enhancement overlying the right ventricle.

7. Grossly, no extracardiac findings. Recommended dedicated study if
concerned for non-cardiac pathology.
IMPRESSION: Modified Licho Treto Criteria met for Myocarditis. Some evidence of
myopericarditis.

Evidence of mild root and ascending aortic dilation. Consider
secondary imaging modality (echocardiogram, CTA Aorta Protocol, MRA
Aorta Protocol) in one year for follow up if clinically indicated.

## 2023-02-01 DIAGNOSIS — H353111 Nonexudative age-related macular degeneration, right eye, early dry stage: Secondary | ICD-10-CM | POA: Diagnosis not present

## 2023-02-01 DIAGNOSIS — D3131 Benign neoplasm of right choroid: Secondary | ICD-10-CM | POA: Diagnosis not present

## 2023-02-01 DIAGNOSIS — H35363 Drusen (degenerative) of macula, bilateral: Secondary | ICD-10-CM | POA: Diagnosis not present

## 2023-02-01 DIAGNOSIS — H25813 Combined forms of age-related cataract, bilateral: Secondary | ICD-10-CM | POA: Diagnosis not present

## 2023-03-02 DIAGNOSIS — L820 Inflamed seborrheic keratosis: Secondary | ICD-10-CM | POA: Diagnosis not present

## 2023-03-02 DIAGNOSIS — L570 Actinic keratosis: Secondary | ICD-10-CM | POA: Diagnosis not present

## 2023-03-02 DIAGNOSIS — X32XXXD Exposure to sunlight, subsequent encounter: Secondary | ICD-10-CM | POA: Diagnosis not present

## 2023-05-28 ENCOUNTER — Telehealth: Payer: Self-pay | Admitting: Gastroenterology

## 2023-05-28 NOTE — Telephone Encounter (Signed)
 Received patient's colonoscopy records from 2016. Patient is requesting to know when he is due for his next colonoscopy. Please advise, thank you.

## 2023-05-28 NOTE — Telephone Encounter (Signed)
 Left message advising patient of recommendation to repeat colonoscopy in 2026 providing no change in family history, no current GI symptoms. Patient asked to call back with any questions. Recall colonoscopy entered in EPIC for 11/2024.

## 2023-05-28 NOTE — Telephone Encounter (Signed)
 Dr C- Please see 2016 colonoscopy report under "media" tab in EPIC and advise on recommended follow up date... Still 2026?

## 2023-06-01 ENCOUNTER — Ambulatory Visit: Payer: Medicare HMO | Attending: Cardiovascular Disease | Admitting: Cardiovascular Disease

## 2023-06-01 ENCOUNTER — Encounter: Payer: Self-pay | Admitting: Cardiovascular Disease

## 2023-06-01 VITALS — BP 108/70 | HR 60 | Ht 71.0 in | Wt 175.0 lb

## 2023-06-01 DIAGNOSIS — I7121 Aneurysm of the ascending aorta, without rupture: Secondary | ICD-10-CM

## 2023-06-01 DIAGNOSIS — I214 Non-ST elevation (NSTEMI) myocardial infarction: Secondary | ICD-10-CM

## 2023-06-01 NOTE — Assessment & Plan Note (Signed)
 History of non-STEMI 05/12/2021 her troponins went up to 1168.  Cardiac catheterization performed Dr. Verlin revealed nonobstructive CAD.  Cardiac MRI showed mild myopericarditis and a small ascending thoracic aortic aneurysm.  He gets occasional noncardiac chest pain.

## 2023-06-01 NOTE — Patient Instructions (Signed)

## 2023-06-01 NOTE — Assessment & Plan Note (Signed)
 Small ascending thoracic aortic aneurysm measuring 40 mm by 2D echo 06/08/2022.

## 2023-06-01 NOTE — Progress Notes (Signed)
 06/01/2023 Norman Pearson   August 28, 1950  [id number]  Primary Physician Janey Santos, MD Primary Cardiologist: Dorn JINNY Lesches MD GENI CODY LYNITA ILAH  HPI:  Norman Pearson is a 73 y.o.   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  [place]  I last saw him in the office 06/05/2022.  He has no cardiovascular risk factors.  He had never had a myocardial infarction or stroke.  He did have COVID back in August of last year.  He developed arm and chest pain on 05/12/2021.  Apparently his granddaughters dog had died and they had buried it.  His EKG on admission showed no acute changes.  His cardiac enzymes elevated at 1168.  His 2D echo was normal.  Cardiac catheterization performed Dr. Verlin was normal as well with out obstructive disease.  Cardiac MRI showed mild myopericarditis with a small ascending thoracic aortic aneurysm measuring 40 mm of no clinical consequence.  He is very active, mows his own yard and splits his own wood.  He does have a treadmill at home but has difficulty because of knee issues.  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   Since I saw him a year ago he did have 1 episode of transient global amnesia.  He gets occasional noncardiac chest pain.  His last Myoview  performed 10/08/2021 was nonischemic and last echo performed 06/08/2022 showed normal LV systolic function, mild basal septal hypertrophy, grade 1 diastolic dysfunction and a small ascending thoracic aortic aneurysm measuring 40 mm.  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   Current Meds  Medication Sig   acetaminophen  (TYLENOL ) 500 MG tablet Take 500 mg by mouth daily as needed (pain).   AMBULATORY NON FORMULARY MEDICATION FD guard take 2 capsules twice daily 30-60 minutes before meals  Lot #[id number] Exp  05/2021 x 7 boxes (Patient taking differently: Take 1 capsule by mouth 2 (two) times daily as needed (acid reflux/indigestion). FD guard   Lot #[id number] Exp 05/2021 x 7 boxes)   aspirin  81 MG chewable tablet Chew 1 tablet (81 mg total) by mouth daily.   Calcium  Carbonate Antacid (TUMS PO) Take 0.5 tablets by mouth at bedtime as needed (indigestion/acid reflux).   flurazepam (DALMANE) 15 MG capsule Take 15 mg by mouth at bedtime as needed for sleep.   HYDROcodone-acetaminophen  (NORCO/VICODIN) 5-325 MG tablet Take 1 tablet by mouth daily as needed for severe pain (pain score 7-10). prn   loratadine (CLARITIN) 10 MG tablet Take 10 mg by mouth daily as needed (seasonal allergies (May)).   metoprolol  tartrate (LOPRESSOR ) 25 MG tablet Take 1 tablet (25 mg total) by mouth 2 (two) times daily.   MILK THISTLE PO Take 1 tablet by mouth daily.   Multiple Vitamin (MULTIVITAMIN WITH MINERALS) TABS tablet Take 1 tablet by mouth 2 (two) times a week.   olopatadine (PATANOL) 0.1 % ophthalmic solution Place 1 drop into both eyes 2 (two) times daily as needed (seasonal allergies (May)).   PEPPERMINT OIL PO Take 1 capsule by mouth every evening.   Saw Palmetto, Serenoa repens, (SAW PALMETTO PO) Take 1 tablet by mouth daily.   tamsulosin  (FLOMAX ) 0.4 MG CAPS capsule Take 1 capsule by mouth daily.     Allergies  Allergen Reactions   Other Itching  Grass pollen   Fentanyl  Other (See Comments)    bad taste reaction to patch    Social History   Socioeconomic History   Marital status: Married    Spouse name: Not on file   Number of children: 1   Years of education: Not on file   Highest education level: Not on file  Occupational History   Not on file  Tobacco Use   Smoking status: Former    Current packs/day: 0.00    Types: Cigarettes    Quit date: 04/13/1974    Years since quitting: 49.1   Smokeless tobacco: Former   Tobacco comments:    quit over 50 years ago. Smoked 1 ppd for 3 years when  young  Vaping Use   Vaping status: Never Used  Substance and Sexual Activity   Alcohol  use: Yes    Alcohol /week: 3.0 standard drinks of alcohol     Types: 3 Standard drinks or equivalent per week    Comment: 1 oz of liquor 2-3 times a week   Drug use: No    Comment: remote use of cocaine when young   Sexual activity: Not on file  Other Topics Concern   Not on file  Social History Narrative   Not on file   Social Drivers of Health   Financial Resource Strain: Not on file  Food Insecurity: Not on file  Transportation Needs: Not on file  Physical Activity: Not on file  Stress: Not on file  Social Connections: Not on file  Intimate Partner Violence: Not on file     Review of Systems: General: negative for chills, fever, night sweats or weight changes.  Cardiovascular: negative for chest pain, dyspnea on exertion, edema, orthopnea, palpitations, paroxysmal nocturnal dyspnea or shortness of breath Dermatological: negative for rash Respiratory: negative for cough or wheezing Urologic: negative for hematuria Abdominal: negative for nausea, vomiting, diarrhea, bright red blood per rectum, melena, or hematemesis Neurologic: negative for visual changes, syncope, or dizziness All other systems reviewed and are otherwise negative except as noted above.    Blood pressure 108/70, pulse 60, height 5' 11 (1.803 m), weight 175 lb (79.4 kg), SpO2 98%.  General appearance: alert and no distress Neck: no adenopathy, no carotid bruit, no JVD, supple, symmetrical, trachea midline, and thyroid  not enlarged, symmetric, no tenderness/mass/nodules Lungs: clear to auscultation bilaterally Heart: regular rate and rhythm, S1, S2 normal, no murmur, click, rub or gallop Extremities: extremities normal, atraumatic, no cyanosis or edema Pulses: 2+ and symmetric Skin: Skin color, texture, turgor normal. No rashes or lesions Neurologic: Grossly normal  EKG EKG Interpretation Date/Time:  Tuesday  June 01 2023 14:21:31 EST Ventricular Rate:  60 PR Interval:  180 QRS Duration:  96 QT Interval:  448 QTC Calculation: 448 R Axis:   57  Text Interpretation: Normal sinus rhythm Nonspecific ST and T wave abnormality When compared with ECG of 14-May-2021 05:40, No significant change was found Confirmed by Court Carrier [phone]) on 06/01/2023 2:39:57 PM    ASSESSMENT AND PLAN:   NSTEMI (non-ST elevated myocardial infarction) (HCC) History of non-STEMI 05/12/2021 her troponins went up to 1168.  Cardiac catheterization performed Dr. Verlin revealed nonobstructive CAD.  Cardiac MRI showed mild myopericarditis and a small ascending thoracic aortic aneurysm.  He gets occasional noncardiac chest pain.  Thoracic aortic aneurysm (HCC) Small ascending thoracic aortic aneurysm measuring 40 mm by 2D echo 06/08/2022.     Carrier DOROTHA Court MD FACP,FACC,FAHA, [facility] 06/01/2023 2:54 PM

## 2023-08-30 DIAGNOSIS — H2513 Age-related nuclear cataract, bilateral: Secondary | ICD-10-CM | POA: Diagnosis not present

## 2023-08-30 DIAGNOSIS — H02052 Trichiasis without entropian right lower eyelid: Secondary | ICD-10-CM | POA: Diagnosis not present

## 2023-08-30 DIAGNOSIS — H52203 Unspecified astigmatism, bilateral: Secondary | ICD-10-CM | POA: Diagnosis not present

## 2023-08-30 DIAGNOSIS — D3131 Benign neoplasm of right choroid: Secondary | ICD-10-CM | POA: Diagnosis not present

## 2023-09-06 DIAGNOSIS — Z79899 Other long term (current) drug therapy: Secondary | ICD-10-CM | POA: Diagnosis not present

## 2023-09-06 DIAGNOSIS — N400 Enlarged prostate without lower urinary tract symptoms: Secondary | ICD-10-CM | POA: Diagnosis not present

## 2023-09-06 DIAGNOSIS — K219 Gastro-esophageal reflux disease without esophagitis: Secondary | ICD-10-CM | POA: Diagnosis not present

## 2023-09-06 DIAGNOSIS — Z1212 Encounter for screening for malignant neoplasm of rectum: Secondary | ICD-10-CM | POA: Diagnosis not present

## 2023-09-13 DIAGNOSIS — Z0000 Encounter for general adult medical examination without abnormal findings: Secondary | ICD-10-CM | POA: Diagnosis not present

## 2023-09-13 DIAGNOSIS — N400 Enlarged prostate without lower urinary tract symptoms: Secondary | ICD-10-CM | POA: Diagnosis not present

## 2023-09-13 DIAGNOSIS — I214 Non-ST elevation (NSTEMI) myocardial infarction: Secondary | ICD-10-CM | POA: Diagnosis not present

## 2023-09-13 DIAGNOSIS — Z1339 Encounter for screening examination for other mental health and behavioral disorders: Secondary | ICD-10-CM | POA: Diagnosis not present

## 2023-09-13 DIAGNOSIS — Z1331 Encounter for screening for depression: Secondary | ICD-10-CM | POA: Diagnosis not present

## 2023-09-13 DIAGNOSIS — G629 Polyneuropathy, unspecified: Secondary | ICD-10-CM | POA: Diagnosis not present

## 2023-09-13 DIAGNOSIS — G4700 Insomnia, unspecified: Secondary | ICD-10-CM | POA: Diagnosis not present

## 2023-09-13 DIAGNOSIS — R82998 Other abnormal findings in urine: Secondary | ICD-10-CM | POA: Diagnosis not present

## 2023-09-13 DIAGNOSIS — M19079 Primary osteoarthritis, unspecified ankle and foot: Secondary | ICD-10-CM | POA: Diagnosis not present

## 2023-09-21 DIAGNOSIS — H02052 Trichiasis without entropian right lower eyelid: Secondary | ICD-10-CM | POA: Diagnosis not present

## 2023-10-12 DIAGNOSIS — Z1283 Encounter for screening for malignant neoplasm of skin: Secondary | ICD-10-CM | POA: Diagnosis not present

## 2023-10-12 DIAGNOSIS — L570 Actinic keratosis: Secondary | ICD-10-CM | POA: Diagnosis not present

## 2023-10-12 DIAGNOSIS — X32XXXD Exposure to sunlight, subsequent encounter: Secondary | ICD-10-CM | POA: Diagnosis not present

## 2023-10-12 DIAGNOSIS — D225 Melanocytic nevi of trunk: Secondary | ICD-10-CM | POA: Diagnosis not present

## 2023-11-11 DIAGNOSIS — N401 Enlarged prostate with lower urinary tract symptoms: Secondary | ICD-10-CM | POA: Diagnosis not present

## 2023-11-11 DIAGNOSIS — R3912 Poor urinary stream: Secondary | ICD-10-CM | POA: Diagnosis not present

## 2023-12-16 ENCOUNTER — Telehealth: Payer: Self-pay | Admitting: Cardiovascular Disease

## 2023-12-16 MED ORDER — METOPROLOL TARTRATE 25 MG PO TABS: 25.0000 mg | ORAL_TABLET | Freq: Two times a day (BID) | ORAL | 1 refills | Status: AC

## 2023-12-16 NOTE — Telephone Encounter (Signed)
*  STAT* If patient is at the pharmacy, call can be transferred to refill team.   1. Which medications need to be refilled? (please list name of each medication and dose if known) metoprolol tartrate (LOPRESSOR) 25 MG tablet  2. Which pharmacy/location (including street and city if local pharmacy) is medication to be sent to? [id number] - [place], [place] - [address]  3. Do they need a 30 day or 90 day supply? 90  

## 2023-12-16 NOTE — Telephone Encounter (Signed)
 Pt's medication was sent to pt's pharmacy as requested. Confirmation received.

## 2024-01-26 DIAGNOSIS — C44311 Basal cell carcinoma of skin of nose: Secondary | ICD-10-CM | POA: Diagnosis not present

## 2024-01-31 DIAGNOSIS — H353111 Nonexudative age-related macular degeneration, right eye, early dry stage: Secondary | ICD-10-CM | POA: Diagnosis not present

## 2024-01-31 DIAGNOSIS — H35363 Drusen (degenerative) of macula, bilateral: Secondary | ICD-10-CM | POA: Diagnosis not present

## 2024-01-31 DIAGNOSIS — H2513 Age-related nuclear cataract, bilateral: Secondary | ICD-10-CM | POA: Diagnosis not present

## 2024-01-31 DIAGNOSIS — D3131 Benign neoplasm of right choroid: Secondary | ICD-10-CM | POA: Diagnosis not present

## 2024-02-15 DIAGNOSIS — G4733 Obstructive sleep apnea (adult) (pediatric): Secondary | ICD-10-CM | POA: Diagnosis not present

## 2024-04-05 ENCOUNTER — Encounter: Payer: Self-pay | Admitting: Gastroenterology

## 2024-04-05 ENCOUNTER — Ambulatory Visit: Admitting: Gastroenterology

## 2024-04-05 VITALS — BP 110/70 | HR 60 | Ht 71.0 in | Wt 177.0 lb

## 2024-04-05 DIAGNOSIS — R1013 Epigastric pain: Secondary | ICD-10-CM | POA: Diagnosis not present

## 2024-04-05 MED ORDER — PANTOPRAZOLE SODIUM 40 MG PO TBEC: 40.0000 mg | DELAYED_RELEASE_TABLET | Freq: Every day | ORAL | 3 refills | Status: AC

## 2024-04-05 NOTE — Progress Notes (Signed)
 Agree with the assessment and plan as outlined by [facility], FNP-C.  Carlitos Bottino, DO, [facility]

## 2024-04-05 NOTE — Progress Notes (Signed)
 Chief Complaint: Dyspepsia  Primary GI Doctor:Dr. San  HPI:  Patient is a  73  year old male patient with past medical history of GERD and thoracic aortic aneurysm, who presents for follow-up on reoccurring symptoms of dyspepsia.   Patient last seen in GI office in 7/22 by Dr. San for follow-up on GERD.  Interval History    Patient presents for evaluation of dyspepsia like symptoms he has had intermittently for several years. He has been on Pantoprazole  in the past which seemed to work well. He has been off them for quite some time. Patient wakes up in middle of night with empty stomach with burning in his stomach, he takes OTC Tums and FDgard which helps some.  He reports last week he ate some stew and shortly after he he had one day of diarrhea followed by stomach burning and discomfort. He had decrease appetite over the course of a few days but since then has improved. No black tarry stools. No new medications.  Labs done by PCP during summer. Reports normal.   Not taking any NSAID's.   Socially drinks alcohol  on weekend.  Endoscopic History: -Colonoscopy 2006, Dr. Dyane. Normal.  -Colonoscopy 11/2014, Dr. Dyane.  Normal. Repeat 10 years - EGD 07/2020, Dr. San: Normal esophagus, Hill grade 2, single benign gastric fundic gland polyp  Wt Readings from Last 3 Encounters:  04/05/24 177 lb (80.3 kg)  06/01/23 175 lb (79.4 kg)  06/05/22 174 lb 9.6 oz (79.2 kg)    Past Medical History:  Diagnosis Date   COVID-19 12/2020   Past Surgical History:  Procedure Laterality Date   COLONOSCOPY  2016   Dr Dyane [facility]. [place] he was clean   EXTERNAL FIXATION LEG  04/14/2011   Procedure: EXTERNAL FIXATION LEG;  Surgeon: Norleen Armor, MD;  Location: [facility] OR;  Service: Orthopedics;  Laterality: Left;  External fixation left lower leg   I & D EXTREMITY  04/14/2011   Procedure: IRRIGATION AND DEBRIDEMENT EXTREMITY;  Surgeon: Norleen Armor, MD;  Location: [facility] OR;  Service:  Orthopedics;  Laterality: Left;  Repair of left hand laceration   LEFT HEART CATH AND CORONARY ANGIOGRAPHY N/A 05/12/2021   Procedure: LEFT HEART CATH AND CORONARY ANGIOGRAPHY;  Surgeon: Verlin Lonni BIRCH, MD;  Location: [facility] INVASIVE CV LAB;  Service: Cardiovascular;  Laterality: N/A;   ORIF TIBIA FRACTURE  04/23/2011   Procedure: OPEN REDUCTION INTERNAL FIXATION (ORIF) TIBIA FRACTURE;  Surgeon: Norleen Armor, MD;  Location: [facility] OR;  Service: Orthopedics;  Laterality: Left;  Removal of External Fixator, Open reduction internal fixation Left Tibial Pilon Fracture    OTHER SURGICAL HISTORY     tendon surgery    Current Outpatient Medications  Medication Sig Dispense Refill   acetaminophen  (TYLENOL ) 500 MG tablet Take 500 mg by mouth daily as needed (pain).     AMBULATORY NON FORMULARY MEDICATION FD guard take 2 capsules twice daily 30-60 minutes before meals  Lot #[id number] Exp 05/2021 x 7 boxes (Patient taking differently: Take 1 capsule by mouth 2 (two) times daily as needed (acid reflux/indigestion). FD guard   Lot #[id number] Exp 05/2021 x 7 boxes) 28 capsule 0   Calcium  Carbonate Antacid (TUMS PO) Take 0.5 tablets by mouth at bedtime as needed (indigestion/acid reflux).     HYDROcodone-acetaminophen  (NORCO/VICODIN) 5-325 MG tablet Take 1 tablet by mouth daily as needed for severe pain (pain score 7-10). prn     loratadine (CLARITIN) 10 MG tablet Take 10 mg by mouth daily as  needed (seasonal allergies (Ednamae Schiano)).     metoprolol  tartrate (LOPRESSOR ) 25 MG tablet Take 1 tablet (25 mg total) by mouth 2 (two) times daily. 180 tablet 1   MILK THISTLE PO Take 1 tablet by mouth daily.     Multiple Vitamin (MULTIVITAMIN WITH MINERALS) TABS tablet Take 1 tablet by mouth 2 (two) times a week.     olopatadine (PATANOL) 0.1 % ophthalmic solution Place 1 drop into both eyes 2 (two) times daily as needed (seasonal allergies (Kiko Ripp)).     pantoprazole  (PROTONIX ) 40 MG tablet Take 1 tablet (40 mg total) by mouth  daily. 90 tablet 3   PEPPERMINT OIL PO Take 1 capsule by mouth every evening.     Saw Palmetto, Serenoa repens, (SAW PALMETTO PO) Take 1 tablet by mouth daily.     tamsulosin  (FLOMAX ) 0.4 MG CAPS capsule Take 1 capsule by mouth daily.     aspirin  81 MG chewable tablet Chew 1 tablet (81 mg total) by mouth daily. (Patient not taking: Reported on 04/05/2024) 90 tablet 1   flurazepam (DALMANE) 15 MG capsule Take 15 mg by mouth at bedtime as needed for sleep. (Patient not taking: Reported on 04/05/2024)     No current facility-administered medications for this visit.    Allergies as of 04/05/2024 - Review Complete 04/05/2024  Allergen Reaction Noted   Other Itching 10/24/2019   Fentanyl  Other (See Comments) 06/30/2011    Family History  Problem Relation Age of Onset   Dementia Mother    Heart failure Father    Heart attack Paternal Grandfather    Colon cancer Neg Hx    Esophageal cancer Neg Hx    Pancreatic cancer Neg Hx    Liver disease Neg Hx     Review of Systems:    Constitutional: No weight loss, fever, chills, weakness or fatigue HEENT: Eyes: No change in vision               Ears, Nose, Throat:  No change in hearing or congestion Skin: No rash or itching Cardiovascular: No chest pain, chest pressure or palpitations   Respiratory: No SOB or cough Gastrointestinal: See HPI and otherwise negative Genitourinary: No dysuria or change in urinary frequency Neurological: No headache, dizziness or syncope Musculoskeletal: No new muscle or joint pain Hematologic: No bleeding or bruising Psychiatric: No history of depression or anxiety    Physical Exam:  Vital signs: BP 110/70   Pulse 60   Ht 5' 11 (1.803 m)   Wt 177 lb (80.3 kg)   BMI 24.69 kg/m   Constitutional:   Pleasant male appears to be in NAD, Well developed, Well nourished, alert and cooperative Throat: Oral cavity and pharynx without inflammation, swelling or lesion.  Respiratory: Respirations even and  unlabored. Lungs clear to auscultation bilaterally.   No wheezes, crackles, or rhonchi.  Cardiovascular: Normal S1, S2. Regular rate and rhythm. No peripheral edema, cyanosis or pallor.  Gastrointestinal:  Soft, nondistended, nontender. No rebound or guarding. Normal bowel sounds. No appreciable masses or hepatomegaly. Rectal:  Not performed.  Msk:  Symmetrical without gross deformities. Without edema, no deformity or joint abnormality.  Neurologic:  Alert and  oriented x4;  grossly normal neurologically.  Skin:   Dry and intact without significant lesions or rashes.  RELEVANT LABS AND IMAGING: CBC    Latest Ref Rng & Units 05/12/2021   11:42 AM 01/14/2021    8:14 AM 05/04/2011    6:00 AM  CBC  WBC 4.0 - 10.5  K/uL 8.2  3.3  5.6   Hemoglobin 13.0 - 17.0 g/dL 82.8  83.0  89.7   Hematocrit 39.0 - 52.0 % 49.9  49.5  31.1   Platelets 150 - 400 K/uL 213  157  540      CMP     Latest Ref Rng & Units 05/13/2021   12:26 AM 05/12/2021   11:42 AM 01/14/2021    8:14 AM  CMP  Glucose 70 - 99 mg/dL 885  890  880   BUN 8 - 23 mg/dL 14  17  10    Creatinine 0.61 - 1.24 mg/dL 8.96  8.87  8.91   Sodium 135 - 145 mmol/L 138  140  137   Potassium 3.5 - 5.1 mmol/L 3.7  4.4  3.6   Chloride 98 - 111 mmol/L 108  106  106   CO2 22 - 32 mmol/L 21  26  22    Calcium  8.9 - 10.3 mg/dL 8.8  9.5  9.0   Total Protein 6.5 - 8.1 g/dL   6.7   Total Bilirubin 0.3 - 1.2 mg/dL   1.1   Alkaline Phos 38 - 126 U/L   51   AST 15 - 41 U/L   44   ALT 0 - 44 U/L   29     Assessment: Encounter Diagnosis  Name Primary?   Dyspepsia Yes     73 year old male patient with history of dyspepsia and enquires about restarting PPI therapy as it has helped in the past. OTC tums and FDgard provide temporary relief. Reinforced GERD diet, with no late meals. Not currently taking any NSAIDs. Recently had bout of gastroenteritis which exacerbated his issues a few days. Restart Pantoprazole  40 mg po daily, if no improvement we  discussed further testing options. EGD 07/2020, Dr. San: Normal esophagus, Hill grade 2, single benign gastric fundic gland polyp. Patient had colonoscopy 11/2014, Dr. Dyane.  Normal. Repeat 10 years.     Plan: -Restart Pantoprazole  40 mg po daily for 2-4 weeks, then decrease to 20 mg po daily -Recommend GERD diet, no late meals  -samples gourmet reflux for breakthrough symptoms -recall colonoscopy 11/2024   Thank you for the courtesy of this consult. Please call me with any questions or concerns.   Kory Panjwani, FNP-C [facility] 04/05/2024, 10:09 AM  Cc: Avva, Ravisankar, MD

## 2024-04-05 NOTE — Patient Instructions (Addendum)
 Recommend GERD diet, no late meals Start pantoprazole  40 mg po daily , take for 1 month then update me via Mychart Continue Fdgard  _______________________________________________________  If your blood pressure at your visit was 140/90 or greater, please contact your primary care physician to follow up on this.  _______________________________________________________  If you are age 75 or older, your body mass index should be between 23-30. Your Body mass index is 24.69 kg/m. If this is out of the aforementioned range listed, please consider follow up with your Primary Care Provider.  If you are age 15 or younger, your body mass index should be between 19-25. Your Body mass index is 24.69 kg/m. If this is out of the aformentioned range listed, please consider follow up with your Primary Care Provider.   ________________________________________________________  The [facility] providers would like to encourage you to use MYCHART to communicate with providers for non-urgent requests or questions.  Due to long hold times on the telephone, sending your provider a message by [facility] may be a faster and more efficient way to get a response.  Please allow 48 business hours for a response.  Please remember that this is for non-urgent requests.  _______________________________________________________  [facility] is using a team-based approach to care.  Your team is made up of your doctor and two to three APPS. Our APPS (Nurse Practitioners and Physician Assistants) work with your physician to ensure care continuity for you. They are fully qualified to address your health concerns and develop a treatment plan. They communicate directly with your gastroenterologist to care for you. Seeing the Advanced Practice Practitioners on your physician's team can help you by facilitating care more promptly, often allowing for earlier appointments, access to diagnostic testing, procedures, and other  specialty referrals.   Thank you for trusting me with your gastrointestinal care. Deanna May, FNP-C

## 2024-06-05 ENCOUNTER — Ambulatory Visit: Attending: Cardiovascular Disease | Admitting: Cardiovascular Disease

## 2024-06-05 ENCOUNTER — Encounter: Payer: Self-pay | Admitting: Cardiovascular Disease

## 2024-06-05 VITALS — BP 95/59 | HR 62 | Ht 71.0 in | Wt 172.0 lb

## 2024-06-05 DIAGNOSIS — I7121 Aneurysm of the ascending aorta, without rupture: Secondary | ICD-10-CM | POA: Diagnosis not present

## 2024-06-05 DIAGNOSIS — I214 Non-ST elevation (NSTEMI) myocardial infarction: Secondary | ICD-10-CM

## 2024-06-05 NOTE — Assessment & Plan Note (Signed)
 Small ascending thoracic aortic aneurysm measuring 40 mm last evaluated by 2D echo 06/08/2022.  He also had mild basal asymmetric septal hypertrophy.  We will recheck this in 1 year.

## 2024-06-05 NOTE — Progress Notes (Signed)
 "     06/05/2024 Norman Pearson   Sep 16, 1950  [id number]  Primary Physician Janey Santos, MD Primary Cardiologist: Dorn JINNY Lesches MD GENI CODY LYNITA ILAH  HPI:  Norman Pearson is a 74 y.o.    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  [place].  I last saw him in the office 06/01/2023.  He has no cardiovascular risk factors.  He had never had a myocardial infarction or stroke.  He did have COVID back in August of last year.  He developed arm and chest pain on 05/12/2021.  Apparently his granddaughters dog had died and they had buried it.  His EKG on admission showed no acute changes.  His cardiac enzymes elevated at 1168.  His 2D echo was normal.  Cardiac catheterization performed Dr. Verlin was normal as well with out obstructive disease.  Cardiac MRI showed mild myopericarditis with a small ascending thoracic aortic aneurysm measuring 40 mm of no clinical consequence.  He is very active, mows his own yard and splits his own wood.  He does have a treadmill at home but has difficulty because of knee issues.  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   Since I saw him a year ago he continues to do well.  He is very active, splits wood, works at his farm and denies chest pain or shortness of breath.  Echo performed 06/08/2022 showed an ascending thoracic aorta measuring 40 mm with mild basal septal hypertrophy and grade 1 diastolic dysfunction.  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   Active Medications[1]   Allergies[2]  Social History   Socioeconomic History   Marital status: Married    Spouse name: Not on file   Number of children: 1   Years of education: Not on file   Highest education level: Not on file  Occupational History   Not on file  Tobacco  Use   Smoking status: Former    Current packs/day: 0.00    Types: Cigarettes    Quit date: 04/13/1974    Years since quitting: 50.1   Smokeless tobacco: Former   Tobacco comments:    quit over 50 years ago. Smoked 1 ppd for 3 years when young  Vaping Use   Vaping status: Never Used  Substance and Sexual Activity   Alcohol  use: Yes    Alcohol /week: 3.0 standard drinks of alcohol     Types: 3 Standard drinks or equivalent per week    Comment: 1 oz of liquor 2-3 times a week   Drug use: No    Comment: remote use of cocaine when young   Sexual activity: Not on file  Other Topics Concern   Not on file  Social History Narrative   Not on file   Social Drivers of Health   Tobacco Use: Medium Risk (04/05/2024)   Patient History    Smoking Tobacco Use: Former    Smokeless Tobacco Use: Former    Passive Exposure: Not on Stage Manager: Not on [employer] Insecurity: Not on file  Transportation Needs: Not on file  Physical Activity: Not on file  Stress: Not on file  Social Connections: Not on file  Intimate Partner Violence: Not on file  Depression (EYV7-0): Not  on file  Alcohol  Screen: Not on file  Housing: Not on file  Utilities: Not on file  Health Literacy: Not on file     Review of Systems: General: negative for chills, fever, night sweats or weight changes.  Cardiovascular: negative for chest pain, dyspnea on exertion, edema, orthopnea, palpitations, paroxysmal nocturnal dyspnea or shortness of breath Dermatological: negative for rash Respiratory: negative for cough or wheezing Urologic: negative for hematuria Abdominal: negative for nausea, vomiting, diarrhea, bright red blood per rectum, melena, or hematemesis Neurologic: negative for visual changes, syncope, or dizziness All other systems reviewed and are otherwise negative except as noted above.    Blood pressure (!) 95/59, pulse 62, height 5' 11 (1.803 m), weight 172 lb (78 kg), SpO2 97%.   General appearance: alert and no distress Neck: no adenopathy, no carotid bruit, no JVD, supple, symmetrical, trachea midline, and thyroid  not enlarged, symmetric, no tenderness/mass/nodules Lungs: clear to auscultation bilaterally Heart: regular rate and rhythm, S1, S2 normal, no murmur, click, rub or gallop Extremities: extremities normal, atraumatic, no cyanosis or edema Pulses: 2+ and symmetric Skin: Skin color, texture, turgor normal. No rashes or lesions Neurologic: Grossly normal  EKG EKG Interpretation Date/Time:  Monday June 05 2024 10:04:17 EST Ventricular Rate:  62 PR Interval:  186 QRS Duration:  108 QT Interval:  416 QTC Calculation: 422 R Axis:   48  Text Interpretation: Normal sinus rhythm Normal ECG When compared with ECG of 01-Jun-2023 14:21, No significant change was found Confirmed by Court Carrier [phone]) on 06/05/2024 10:10:27 AM    ASSESSMENT AND PLAN:   NSTEMI (non-ST elevated myocardial infarction) (HCC) History of non-STEMI 05/12/2021 with cath that showed no obstructive disease and MRI that showed mild myopericarditis with an ascending aorta measuring 40 mm.  He is completely asymptomatic.  Thoracic aortic aneurysm Small ascending thoracic aortic aneurysm measuring 40 mm last evaluated by 2D echo 06/08/2022.  He also had mild basal asymmetric septal hypertrophy.  We will recheck this in 1 year.     Carrier DOROTHA Court MD FACP,FACC,FAHA, FSCAI 06/05/2024 10:18 AM    [1]  Current Meds  Medication Sig   acetaminophen  (TYLENOL ) 500 MG tablet Take 500 mg by mouth daily as needed (pain).   AMBULATORY NON FORMULARY MEDICATION FD guard take 2 capsules twice daily 30-60 minutes before meals  Lot #[id number] Exp 05/2021 x 7 boxes (Patient taking differently: Take 1 capsule by mouth 2 (two) times daily as needed (acid reflux/indigestion). FD guard   Lot #[id number] Exp 05/2021 x 7 boxes)   aspirin  81 MG chewable tablet Chew 1 tablet (81 mg total) by mouth  daily.   Calcium  Carbonate Antacid (TUMS PO) Take 0.5 tablets by mouth at bedtime as needed (indigestion/acid reflux).   HYDROcodone-acetaminophen  (NORCO/VICODIN) 5-325 MG tablet Take 1 tablet by mouth daily as needed for severe pain (pain score 7-10). prn   loratadine (CLARITIN) 10 MG tablet Take 10 mg by mouth daily as needed (seasonal allergies (May)).   metoprolol  tartrate (LOPRESSOR ) 25 MG tablet Take 1 tablet (25 mg total) by mouth 2 (two) times daily.   MILK THISTLE PO Take 1 tablet by mouth daily.   Multiple Vitamin (MULTIVITAMIN WITH MINERALS) TABS tablet Take 1 tablet by mouth 2 (two) times a week.   olopatadine (PATANOL) 0.1 % ophthalmic solution Place 1 drop into both eyes 2 (two) times daily as needed (seasonal allergies (May)).   PEPPERMINT OIL PO Take 1 capsule by mouth every evening.   Saw Palmetto, Serenoa repens, (  SAW PALMETTO PO) Take 1 tablet by mouth daily.   tamsulosin  (FLOMAX ) 0.4 MG CAPS capsule Take 1 capsule by mouth daily.  [2]  Allergies Allergen Reactions   Other Itching    Grass pollen   Fentanyl  Other (See Comments)    bad taste reaction to patch   "

## 2024-06-05 NOTE — Assessment & Plan Note (Signed)
 History of non-STEMI 05/12/2021 with cath that showed no obstructive disease and MRI that showed mild myopericarditis with an ascending aorta measuring 40 mm.  He is completely asymptomatic.

## 2024-06-05 NOTE — Patient Instructions (Signed)
